# Patient Record
Sex: Male | Born: 1960 | Race: Black or African American | Hispanic: No | Marital: Married | Smoking: Never smoker
Health system: Southern US, Community
[De-identification: ages and names within clinical notes are randomized; demographics above are authoritative.]

## PROBLEM LIST (undated history)

## (undated) DIAGNOSIS — K279 Peptic ulcer, site unspecified, unspecified as acute or chronic, without hemorrhage or perforation: Secondary | ICD-10-CM

## (undated) DIAGNOSIS — I4891 Unspecified atrial fibrillation: Secondary | ICD-10-CM

## (undated) DIAGNOSIS — N183 Chronic kidney disease, stage 3 unspecified: Secondary | ICD-10-CM

## (undated) DIAGNOSIS — M109 Gout, unspecified: Secondary | ICD-10-CM

## (undated) DIAGNOSIS — I5022 Chronic systolic (congestive) heart failure: Secondary | ICD-10-CM

## (undated) DIAGNOSIS — I4819 Other persistent atrial fibrillation: Secondary | ICD-10-CM

## (undated) DIAGNOSIS — I10 Essential (primary) hypertension: Secondary | ICD-10-CM

## (undated) DIAGNOSIS — I428 Other cardiomyopathies: Secondary | ICD-10-CM

## (undated) HISTORY — DX: Chronic systolic (congestive) heart failure: I50.22

## (undated) HISTORY — DX: Unspecified atrial fibrillation: I48.91

## (undated) HISTORY — DX: Peptic ulcer, site unspecified, unspecified as acute or chronic, without hemorrhage or perforation: K27.9

## (undated) HISTORY — DX: Other cardiomyopathies: I42.8

## (undated) HISTORY — DX: Chronic kidney disease, stage 3 (moderate): N18.3

## (undated) HISTORY — DX: Other persistent atrial fibrillation: I48.19

## (undated) HISTORY — PX: TOE SURGERY: SHX1073

## (undated) HISTORY — DX: Chronic kidney disease, stage 3 unspecified: N18.30

---

## 2002-02-17 ENCOUNTER — Emergency Department (HOSPITAL_COMMUNITY): Admission: EM | Admit: 2002-02-17 | Discharge: 2002-02-18 | Payer: Self-pay | Admitting: Emergency Medicine

## 2003-02-17 ENCOUNTER — Emergency Department (HOSPITAL_COMMUNITY): Admission: EM | Admit: 2003-02-17 | Discharge: 2003-02-17 | Payer: Self-pay | Admitting: Emergency Medicine

## 2004-01-23 ENCOUNTER — Emergency Department (HOSPITAL_COMMUNITY): Admission: EM | Admit: 2004-01-23 | Discharge: 2004-01-23 | Payer: Self-pay | Admitting: Emergency Medicine

## 2004-10-21 ENCOUNTER — Emergency Department (HOSPITAL_COMMUNITY): Admission: EM | Admit: 2004-10-21 | Discharge: 2004-10-21 | Payer: Self-pay | Admitting: Emergency Medicine

## 2006-01-29 ENCOUNTER — Emergency Department (HOSPITAL_COMMUNITY): Admission: EM | Admit: 2006-01-29 | Discharge: 2006-01-29 | Payer: Self-pay | Admitting: Emergency Medicine

## 2008-02-24 ENCOUNTER — Emergency Department (HOSPITAL_COMMUNITY): Admission: EM | Admit: 2008-02-24 | Discharge: 2008-02-25 | Payer: Self-pay | Admitting: Emergency Medicine

## 2008-11-11 ENCOUNTER — Emergency Department (HOSPITAL_COMMUNITY): Admission: EM | Admit: 2008-11-11 | Discharge: 2008-11-11 | Payer: Self-pay | Admitting: Family Medicine

## 2009-06-25 ENCOUNTER — Encounter: Admission: RE | Admit: 2009-06-25 | Discharge: 2009-06-25 | Payer: Self-pay | Admitting: General Practice

## 2010-01-21 ENCOUNTER — Emergency Department (HOSPITAL_COMMUNITY): Admission: EM | Admit: 2010-01-21 | Discharge: 2010-01-21 | Payer: Self-pay | Admitting: Family Medicine

## 2012-02-18 ENCOUNTER — Emergency Department (INDEPENDENT_AMBULATORY_CARE_PROVIDER_SITE_OTHER)
Admission: EM | Admit: 2012-02-18 | Discharge: 2012-02-18 | Disposition: A | Discharge status: Home / Self Care | Payer: BC Managed Care – PPO | Source: Home / Self Care | Attending: Emergency Medicine | Admitting: Emergency Medicine

## 2012-02-18 ENCOUNTER — Encounter (HOSPITAL_COMMUNITY): Payer: Self-pay | Admitting: *Deleted

## 2012-02-18 DIAGNOSIS — R079 Chest pain, unspecified: Secondary | ICD-10-CM

## 2012-02-18 DIAGNOSIS — M109 Gout, unspecified: Secondary | ICD-10-CM

## 2012-02-18 HISTORY — DX: Gout, unspecified: M10.9

## 2012-02-18 MED ORDER — COLCHICINE 0.6 MG PO TABS: 0.6000 mg | ORAL_TABLET | Freq: Every day | ORAL | Status: DC

## 2012-02-18 MED ORDER — NITROGLYCERIN 0.4 MG SL SUBL: 0.4000 mg | SUBLINGUAL_TABLET | SUBLINGUAL | Status: DC | PRN

## 2012-02-18 NOTE — ED Notes (Signed)
Pt with c/o left great toe pain feels like a gout attack onset yesterday worse today -

## 2012-02-18 NOTE — Discharge Instructions (Signed)
Dietary treatment plays a supplementary role in treatment of gout.  Diet can be expected to decrease the uric acid level by 10 to 15%.  Often medication can effect a much more substantial reduction.  An extremely restrictive diet is not necessary, but here are a few suggestions that might help decrease the frequency of gout attacks. ° °The first and most important measure is to achieve and maintain a healthy body weight (BMI of 20 to 25).  It's been shown that people with a BMI over 25 have an increased risk of gout attacks compared to people with a BMI of less than 25.  Also, gout patients who lose as little as 4.5 kg or 9.9 lbs will decrease their risk of gout attacks. ° °Beyond that, here are a few general guidelines: ° °Eat less: °Red meat °Seafood °Beer and hard alcohol (e.g. gin, vodka, whiskey) °Foods that contain high fructose corn syrup (found in sweets and non-diet sodas) °Organ meats (liver, kidneys, brains, sweetbreads) or foods made from these meats (hot dogs, bologna). ° °Eat more: °Low fat dairy products °A moderate amount of wine (up to two 5 oz servings per day) is not likely to increase the risk of a gout attack. °Coffee may decrease the risk of gout attacks °Vitamin C (500 mg per day) has a mild urate lowering effect ° °

## 2012-02-18 NOTE — ED Provider Notes (Signed)
Chief Complaint  Patient presents with  . Gout    History of Present Illness:   Javier Berry is a 51 year old male who has had swelling, pain, redness, and heat in the MTP joint of the left great toe since yesterday. He denies any trauma he had the same thing about a year ago. This was diagnosed as being due to gout. He was seen here by me. He was given indomethacin and this relieves his symptoms immediately. He's not had another attack until yesterday. He is on no uric acid lowering medications and he has not followed up with his primary care doctor about this.  He also mentioned that he had an episode of chest pain today. He states he was trying to get his job done with his foot hurting and longus to this had an episode of substernal chest pain without radiation lasting less than a minute. It went away when he sat down and rested. It was not associated with shortness of breath, nausea, or diaphoresis. He's never had anything like this before. He denies any cardiac history. Right now is pain free. He denies any other cardiovascular complaints. He does have a high blood pressure. No other risk factors for cardiac disease.  Review of Systems:  Other than noted above, the patient denies any of the following symptoms: Systemic:  No fevers, chills, sweats, or aches.  No fatigue or tiredness. Musculoskeletal:  No joint pain, arthritis, bursitis, swelling, back pain, or neck pain. Neurological:  No muscular weakness, paresthesias, headache, or trouble with speech or coordination.  No dizziness.   PMFSH:  Past medical history, family history, social history, meds, and allergies were reviewed.  Physical Exam:   Vital signs:  BP 154/89  Pulse 82  Temp(Src) 98.5 F (36.9 C) (Oral)  Resp 18  SpO2 98% Gen:  Alert and oriented times 3.  In no distress. Lungs: Clear to auscultation. Heart: Regular rhythm no gallops or murmurs. Musculoskeletal: There was swelling and pain to palpation over the MTP joint of the  left great toe. Otherwise, all joints had a full a ROM with no swelling, bruising or deformity.  No edema, pulses full. Extremities were warm and pink.  Capillary refill was brisk.  Skin:  Clear, warm and dry.  No rash. Neuro:  Alert and oriented times 3.  Muscle strength was normal.  Sensation was intact to light touch.    Date: 02/18/2012  Rate: 75  Rhythm: normal sinus rhythm  QRS Axis: normal  Intervals: normal  ST/T Wave abnormalities: nonspecific T wave changes  Conduction Disutrbances:none  Narrative Interpretation: Sinus rhythm with premature supraventricular complexes. The computer reading interpreted nonspecific T wave abnormalities, although looking at myself, I can't really see any significant T wave changes in the EKG looks normal to me except for PACs.  Old EKG Reviewed: none available  Other Labs Obtained at Urgent [facility]:  A uric acid level was obtained.  Results are pending at this time and we will call about any positive results.  Assessment:  The primary encounter diagnosis was Gout. A diagnosis of Chest pain was also pertinent to this visit. He will take the colchicine for his gout and followup with his primary care physician in about 2 weeks. I also suggested that he followup with a cardiologist and he was given the name of Dr. Marca Ancona. I suggested he call tomorrow and try to get in as soon as possible. He was given a prescription for nitroglycerin if he has any further chest  pain. I suggested that if he had any chest pain that lasted for more than 15 minutes that he call 911 and get to the emergency room as soon as possible.  Plan:   1.  The following meds were prescribed:   New Prescriptions   COLCHICINE 0.6 MG TABLET    Take 1 tablet (0.6 mg total) by mouth daily.   NITROGLYCERIN (NITROSTAT) 0.4 MG SL TABLET    Place 1 tablet (0.4 mg total) under the tongue every 5 (five) minutes as needed for chest pain.   2.  The patient was instructed in symptomatic  care, including rest and activity, elevation, application of ice and compression.  Appropriate handouts were given. 3.  The patient was told to return if becoming worse in any way, if no better in 3 or 4 days, and given some red flag symptoms that would indicate earlier return.   4.  The patient was told to follow up with his primary care physician for the gout and with Dr. Marca Ancona for the episode of chest pain.   Javier Likes, MD 02/18/12 [phone]

## 2012-02-19 ENCOUNTER — Telehealth (HOSPITAL_COMMUNITY): Payer: Self-pay | Admitting: *Deleted

## 2012-02-19 NOTE — ED Notes (Signed)
Uric Acid 8.9 H. Pt. adequately treated with Colchicine.  I called pt. Pt. verified x 2 and given results.  Pt. told he was adequately treated. Pt. does have a PCP. Pt. instructed to notify his PCP of his result and ask about long term treatment to reduce his uric acid level. This will help prevent frequent outbreaks of gout. Pt. voiced understanding. He asked if the hot sauce on his wings caused it. I suggested he look on line at foods and drinks that cause gout. Vassie Moselle 02/19/2012

## 2013-07-20 ENCOUNTER — Other Ambulatory Visit: Payer: Self-pay

## 2013-07-20 ENCOUNTER — Emergency Department (INDEPENDENT_AMBULATORY_CARE_PROVIDER_SITE_OTHER)
Admission: EM | Admit: 2013-07-20 | Discharge: 2013-07-20 | Disposition: A | Discharge status: Home / Self Care | Payer: BC Managed Care – PPO | Source: Home / Self Care | Attending: Emergency Medicine | Admitting: Emergency Medicine

## 2013-07-20 ENCOUNTER — Emergency Department (INDEPENDENT_AMBULATORY_CARE_PROVIDER_SITE_OTHER): Payer: BC Managed Care – PPO

## 2013-07-20 ENCOUNTER — Encounter (HOSPITAL_COMMUNITY): Payer: Self-pay | Admitting: Emergency Medicine

## 2013-07-20 DIAGNOSIS — R0789 Other chest pain: Secondary | ICD-10-CM

## 2013-07-20 MED ORDER — METHOCARBAMOL 500 MG PO TABS: 500.0000 mg | ORAL_TABLET | Freq: Three times a day (TID) | ORAL | Status: DC

## 2013-07-20 MED ORDER — TRAMADOL HCL 50 MG PO TABS: 100.0000 mg | ORAL_TABLET | Freq: Three times a day (TID) | ORAL | Status: DC | PRN

## 2013-07-20 NOTE — ED Notes (Signed)
Pt c/o intermittent chest pain onset 3 weeks States after he finishes his gout meds the pain comes back and doesn't know if its related Pain is alleviated when he applies pressure to chest... Hx of HTN Denies: SOB, HA, blurry vision, weakness He is alert and talking in complete sentences w/no signs of acute distress.

## 2013-07-20 NOTE — ED Provider Notes (Signed)
Chief Complaint:   Chief Complaint  Patient presents with  . Chest Pain    History of Present Illness:   Javier Berry is a 52 year old male with hypertension and gout who has had a one-month history of recurring chest pain. The pain is localized along the left sternal border and the pectoral area and radiates to the posterior neck. It is rated 3 or 4/10 in intensity and feels like an ache or a pinching sensation. It can last for hours to days at a time. It came on after he lifted some heavy weights. The pain is not exertional. It's better with Tylenol or Vicodin. He denies any radiation down the arm, numbness, tingling, weakness in the arm. He has not had any fever, chills, or sweats. He denies coughing, wheezing, or shortness of breath. The pain is not worse with deep inspiration. He denies any palpitations, dizziness, syncope, ankle edema, or leg pain or swelling. He's had no dominant pain, nausea, vomiting, indigestion, or heartburn. He has no history of heart disease in the past. He does have a history of a stomach ulcer. He also has a history of gout and attributes the chest pain to gout.  Review of Systems:  Other than noted above, the patient denies any of the following symptoms. Systemic:  No fever, chills, sweats, or fatigue. ENT:  No nasal congestion, rhinorrhea, or sore throat. Pulmonary:  No cough, wheezing, shortness of breath, sputum production, hemoptysis. Cardiac:  No palpitations, rapid heartbeat, dizziness, presyncope or syncope. GI:  No abdominal pain, heartburn, nausea, or vomiting. Ext:  No leg pain or swelling.  PMFSH:  Past medical history, family history, social history, meds, and allergies were reviewed and updated as needed.  Physical Exam:   Vital signs:  BP 132/72  Pulse 66  Temp(Src) 98.1 F (36.7 C) (Oral)  Resp 20  SpO2 100% Gen:  Alert, oriented, in no distress, skin warm and dry. Eye:  PERRL, lids and conjunctivas normal.  Sclera non-icteric. ENT:  Mucous  membranes moist, pharynx clear. Neck:  Supple, no adenopathy or tenderness.  No JVD. Lungs:  Clear to auscultation, no wheezes, rales or rhonchi.  No respiratory distress. Heart:  Regular rhythm.  No gallops, murmers, clicks or rubs. Chest:  No chest wall tenderness. Abdomen:  Soft, nontender, no organomegaly or mass.  Bowel sounds normal.  No pulsatile abdominal mass or bruit. Ext:  No edema.  No calf tenderness and Homann's sign negative.  Pulses full and equal. Skin:  Warm and dry.  No rash.  Radiology:  Dg Chest 2 View  07/20/2013   CLINICAL DATA:  Chest pain for 3 weeks  EXAM: CHEST  2 VIEW  COMPARISON:  02/24/2008  FINDINGS: The cardiac shadow is within normal limits. The lungs are clear bilaterally. No acute bony abnormality is seen.  IMPRESSION: No acute abnormality noted.   Electronically Signed   By: Alcide Clever M.D.   On: 07/20/2013 12:31   I reviewed the images independently and personally and concur with the radiologist's findings.  EKG:   Date: 07/20/2013  Rate: 64  Rhythm: normal sinus rhythm  QRS Axis: normal  Intervals: normal  ST/T Wave abnormalities: nonspecific T wave changes  Conduction Disutrbances:none  Narrative Interpretation: Normal sinus rhythm, nonspecific T wave abnormalities, the T wave abnormalities are very minimal in nature, consisting only of some biphasic T.'s in lead aVL, otherwise remainder the leads appear normal to me. This is unchanged from multiple previous EKGs done in the past.  Old  EKG Reviewed: unchanged  Assessment:  The encounter diagnosis was Musculoskeletal chest pain.  This is most consistent with musculoskeletal chest pain, probably brought on by his weightlifting. I doubt is related to gout. There is no evidence of cardiac disease, but I told him, just to be on the safe side, that he should have a treadmill EKG and gave him the phone number and address of [facility].  Plan:   1.  Meds:  The following meds were prescribed:    New Prescriptions   METHOCARBAMOL (ROBAXIN) 500 MG TABLET    Take 1 tablet (500 mg total) by mouth 3 (three) times daily.   TRAMADOL (ULTRAM) 50 MG TABLET    Take 2 tablets (100 mg total) by mouth every 8 (eight) hours as needed for pain.    2.  Patient Education/Counseling:  The patient was given appropriate handouts, self care instructions, and instructed in symptomatic relief.  No weight lifting until this is cleared up completely.  3.  Follow up:  The patient was told to follow up if no better in 3 to 4 days, if becoming worse in any way, and give some red flag symptoms such as worsening pain, shortness of breath, diaphoresis, or nausea which would prompt immediate return.  Follow up with [facility] within the next week.  The patient was told that the differential diagnosis includes heart disease, and that it was of the utmost importance to followup with the specialist to whom he was referred.      Reuben Likes, MD 07/20/13 1255

## 2013-10-17 ENCOUNTER — Ambulatory Visit (INDEPENDENT_AMBULATORY_CARE_PROVIDER_SITE_OTHER): Payer: BC Managed Care – PPO | Admitting: Family Medicine

## 2013-10-17 ENCOUNTER — Ambulatory Visit: Payer: BC Managed Care – PPO

## 2013-10-17 VITALS — BP 170/100 | HR 82 | Temp 98.0°F | Resp 18 | Ht 71.5 in | Wt 221.4 lb

## 2013-10-17 DIAGNOSIS — I10 Essential (primary) hypertension: Secondary | ICD-10-CM

## 2013-10-17 DIAGNOSIS — R059 Cough, unspecified: Secondary | ICD-10-CM

## 2013-10-17 DIAGNOSIS — K219 Gastro-esophageal reflux disease without esophagitis: Secondary | ICD-10-CM

## 2013-10-17 DIAGNOSIS — M109 Gout, unspecified: Secondary | ICD-10-CM

## 2013-10-17 DIAGNOSIS — I4891 Unspecified atrial fibrillation: Secondary | ICD-10-CM

## 2013-10-17 DIAGNOSIS — I480 Paroxysmal atrial fibrillation: Secondary | ICD-10-CM | POA: Insufficient documentation

## 2013-10-17 DIAGNOSIS — R05 Cough: Secondary | ICD-10-CM

## 2013-10-17 DIAGNOSIS — I499 Cardiac arrhythmia, unspecified: Secondary | ICD-10-CM

## 2013-10-17 LAB — POCT CBC
Granulocyte percent: 45.6 %G (ref 37–80)
HEMATOCRIT: 50.9 % (ref 43.5–53.7)
HEMOGLOBIN: 16.4 g/dL (ref 14.1–18.1)
LYMPH, POC: 2.3 (ref 0.6–3.4)
MCH: 31.6 pg — AB (ref 27–31.2)
MCHC: 32.2 g/dL (ref 31.8–35.4)
MCV: 98 fL — AB (ref 80–97)
MID (cbc): 0.3 (ref 0–0.9)
MPV: 9.6 fL (ref 0–99.8)
POC GRANULOCYTE: 2.2 (ref 2–6.9)
POC LYMPH %: 47.3 % (ref 10–50)
POC MID %: 7.1 %M (ref 0–12)
Platelet Count, POC: 174 10*3/uL (ref 142–424)
RBC: 5.19 M/uL (ref 4.69–6.13)
RDW, POC: 14.1 %
WBC: 4.8 10*3/uL (ref 4.6–10.2)

## 2013-10-17 MED ORDER — DOXYCYCLINE HYCLATE 100 MG PO CAPS: 100.0000 mg | ORAL_CAPSULE | Freq: Two times a day (BID) | ORAL | Status: DC

## 2013-10-17 MED ORDER — METOPROLOL TARTRATE 25 MG PO TABS: 25.0000 mg | ORAL_TABLET | Freq: Two times a day (BID) | ORAL | Status: DC

## 2013-10-17 MED ORDER — RANITIDINE HCL 150 MG PO CAPS: ORAL_CAPSULE | ORAL | Status: DC

## 2013-10-17 NOTE — Progress Notes (Signed)
[facility] [address], [place] [address] [id number] [phone]  Date:  10/17/2013   Name:  Javier Berry   DOB:  Aug 09, 1961   MRN:  [id number]  PCP:  No PCP Per Patient    Chief Complaint: Wheezing, Cough and Chills   History of Present Illness:  Javier Berry is a 53 y.o. very pleasant male patient who presents with the following:  Here today with illness.  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 He was out in the cold in wet clothes recently while working and thinks this may be why he got sick. He has noted a cough and illness for about 9 days now.  At the start of his illness he had a fever and URI symptoms.  Now he has mostly cough, sometimes flushes and chills.    He is coughing up phlegm.  His nasal symptoms are starting to clear up.   He has occasional ST.   His symptoms are overall better but the cough continues.  He has not noted any stomach symptoms.   His PCP is with another clinic; he is quite sure of the name. He just started back on his BP medication today as he just got his new insurance card.  He is taking tribenzor but is not sure of the dose- was able to contact pharm and it is the 40/10/25  He was in the ED in October with CP; this was thought to be MSK in origin and he was in SR at that time. He was supposed to follow-up with [facility] cardiology but did not do so at that time as he did not have insurance.    Noted to be in a fib as below: he has never had this before.  He has maybe noted some palpitations but not syncope, dizziness, or chest pain.  He is able to work hard without CP, except when he goes out in the cold his lungs may hurt.  No known history of DM, CHF, or TIA/ CVA. He has never had a cardiology evaluation.   He states that he has "ulcers," however had never been formally diagnosed with same, no history of GI bleed.  He notes that after eating certain foods he may have abdominal pain and burning that may last for a  month or so.  He may take tums as needed for this problem.   There are no active problems to display for this patient.   Past Medical History  Diagnosis Date  . Gout   . Hypertension     History reviewed. No pertinent past surgical history.  History  Substance Use Topics  . Smoking status: Never Smoker   . Smokeless tobacco: Not on file  . Alcohol Use: No    Family History  Problem Relation Age of Onset  . Kidney disease Mother   . Diabetes Mother   . Hypertension Father   . Kidney disease Sister   . Stroke Sister   . Drug abuse Brother     No Known Allergies  Medication list has been reviewed and updated.  Current Outpatient Prescriptions on File Prior to Visit  Medication Sig Dispense Refill  . Olmesartan-Amlodipine-HCTZ (TRIBENZOR PO) Take by mouth.      . traMADol (ULTRAM) 50 MG tablet Take 2 tablets (100 mg total) by mouth every 8 (eight) hours as needed for pain.  30 tablet  0  . colchicine 0.6 MG tablet Take 1 tablet (0.6 mg  total) by mouth daily.  15 tablet  0  . methocarbamol (ROBAXIN) 500 MG tablet Take 1 tablet (500 mg total) by mouth 3 (three) times daily.  30 tablet  0  . nitroGLYCERIN (NITROSTAT) 0.4 MG SL tablet Place 1 tablet (0.4 mg total) under the tongue every 5 (five) minutes as needed for chest pain.  30 tablet  0   No current facility-administered medications on file prior to visit.    Review of Systems:  As per HPI- otherwise negative.   Physical Examination: Filed Vitals:   10/17/13 1529  BP: 170/100  Pulse: 82  Temp: 98 F (36.7 C)  Resp: 18   Filed Vitals:   10/17/13 1529  Height: 5' 11.5" (1.816 m)  Weight: 221 lb 6.4 oz (100.426 kg)   Body mass index is 30.45 kg/(m^2). Ideal Body Weight:   GEN: WDWN, NAD, Non-toxic, A & O x 3, looks well HEENT: Atraumatic, Normocephalic. Neck supple. No masses, No LAD.  Bilateral TM wnl, oropharynx normal.  PEERL,EOMI.   Ears and Nose: No external deformity. CV: No M/G/R. No JVD. No  thrill. No extra heart sounds. Irregularly irregular.  Suspect a fib.  He is ok with doing an EKG PULM: CTA B, no wheezes, crackles, rhonchi. No retractions. No resp. distress. No accessory muscle use. ABD: S, NT, ND, +BS. No rebound. No HSM. EXTR: No c/c/e NEURO Normal gait.  PSYCH: Normally interactive. Conversant. Not depressed or anxious appearing.  Calm demeanor.   EKG: atrial fibrillation with normal ventricular response, rate 80- 100 BPM  [facility] reading (PRIMARY) by  Dr. Patsy Lageropland. CXR: negative  CHEST 2 VIEW  COMPARISON: PA and lateral chest 07/20/2013 and 02/24/2008.  FINDINGS: The lungs are clear. Heart size is upper normal. No pneumothorax or pleural fluid. No focal bony abnormality.  IMPRESSION: No acute disease  Results for orders placed in visit on 10/17/13  POCT CBC      Result Value Range   WBC 4.8  4.6 - 10.2 K/uL   Lymph, poc 2.3  0.6 - 3.4   POC LYMPH PERCENT 47.3  10 - 50 %L   MID (cbc) 0.3  0 - 0.9   POC MID % 7.1  0 - 12 %M   POC Granulocyte 2.2  2 - 6.9   Granulocyte percent 45.6  37 - 80 %G   RBC 5.19  4.69 - 6.13 M/uL   Hemoglobin 16.4  14.1 - 18.1 g/dL   HCT, POC 40.350.9  47.443.5 - 53.7 %   MCV 98.0 (*) 80 - 97 fL   MCH, POC 31.6 (*) 27 - 31.2 pg   MCHC 32.2  31.8 - 35.4 g/dL   RDW, POC 25.914.1     Platelet Count, POC 174  142 - 424 K/uL   MPV 9.6  0 - 99.8 fL    Assessment and Plan: HTN (hypertension)  Gout  Irregular heartbeat - Plan: EKG 12-Lead  Cough - Plan: DG Chest 2 View, doxycycline (VIBRAMYCIN) 100 MG capsule  Atrial fibrillation - Plan: metoprolol tartrate (LOPRESSOR) 25 MG tablet, POCT CBC, Comprehensive metabolic panel  GERD (gastroesophageal reflux disease) - Plan: ranitidine (ZANTAC) 150 MG capsule  Javier Berry is noted to be in newly recognized a fib today.  He is asymptomatic.  His CHADS2 score is low, so we can use Asprin while we await cardiology evaluation.  He is concerned about his history of stomach sensitivity.  However he has  never had a known true ulcer or GI bleed.  Explained that aspirin is still overall safer than coumadin.  Will have him use a coated aspirin and add zantac to his regimen.  He is ok with this plan.   Also add lopressor 25 BID, continue his tribenzor.    HTN: he just started back on his medication today so difficult to know if his current dose will control his HTN.  Will need follow-up  Cough for several weeks: treat with doxycycline for bronchitis  Signed Abbe Amsterdam, MD

## 2013-10-17 NOTE — Patient Instructions (Addendum)
Start taking a coated aspirin 325 mg once a day. Also, add zantac once a day to help protect your stomach.  Avoid foods that tend to bother your stomach!   Continue taking your tribenzor medication for your blood pressure.  We are going to add lopressor (metoprolol) 25 mg twice a day to help control your heart rate.   Use the doxycycline as directed for bronchitis.  You have a condition called atrial fibrillation; this means that the signal coming from the top part of your heart that starts a new beat is going too fast.  The bottom part of your heart (the ventricles) are still going at a normal rate so you are getting enough blood flow.  However this condition does place you at a higher risk for having a blood clot in your heart that could lead to a stroke.  This is why we are going to have you take the aspirin every day. I will arrange for you to see cardiology in the next week or so for follow-up.

## 2013-10-18 ENCOUNTER — Encounter: Payer: Self-pay | Admitting: Cardiology

## 2013-10-18 ENCOUNTER — Telehealth: Payer: Self-pay | Admitting: Family Medicine

## 2013-10-18 DIAGNOSIS — I4891 Unspecified atrial fibrillation: Secondary | ICD-10-CM

## 2013-10-18 LAB — COMPREHENSIVE METABOLIC PANEL WITH GFR
ALT: 19 U/L (ref 0–53)
AST: 24 U/L (ref 0–37)
Albumin: 4.5 g/dL (ref 3.5–5.2)
Alkaline Phosphatase: 47 U/L (ref 39–117)
BUN: 15 mg/dL (ref 6–23)
CO2: 32 meq/L (ref 19–32)
Calcium: 9.7 mg/dL (ref 8.4–10.5)
Chloride: 103 meq/L (ref 96–112)
Creat: 1.37 mg/dL — ABNORMAL HIGH (ref 0.50–1.35)
Glucose, Bld: 95 mg/dL (ref 70–99)
Potassium: 3.9 meq/L (ref 3.5–5.3)
Sodium: 142 meq/L (ref 135–145)
Total Bilirubin: 1.1 mg/dL (ref 0.3–1.2)
Total Protein: 7.4 g/dL (ref 6.0–8.3)

## 2013-10-18 NOTE — Telephone Encounter (Signed)
Made cardiology appt for pt

## 2013-10-20 ENCOUNTER — Encounter: Payer: Self-pay | Admitting: Family Medicine

## 2013-10-26 ENCOUNTER — Encounter: Payer: Self-pay | Admitting: Cardiology

## 2013-10-28 ENCOUNTER — Encounter: Payer: Self-pay | Admitting: Cardiology

## 2013-10-28 ENCOUNTER — Ambulatory Visit (INDEPENDENT_AMBULATORY_CARE_PROVIDER_SITE_OTHER): Payer: BC Managed Care – PPO | Admitting: Cardiology

## 2013-10-28 ENCOUNTER — Encounter: Payer: Self-pay | Admitting: *Deleted

## 2013-10-28 VITALS — BP 122/84 | HR 83 | Ht 72.0 in | Wt 221.4 lb

## 2013-10-28 DIAGNOSIS — I10 Essential (primary) hypertension: Secondary | ICD-10-CM

## 2013-10-28 DIAGNOSIS — R079 Chest pain, unspecified: Secondary | ICD-10-CM

## 2013-10-28 DIAGNOSIS — I4891 Unspecified atrial fibrillation: Secondary | ICD-10-CM

## 2013-10-28 MED ORDER — APIXABAN 5 MG PO TABS: 5.0000 mg | ORAL_TABLET | Freq: Two times a day (BID) | ORAL | Status: DC

## 2013-10-28 NOTE — Progress Notes (Signed)
      HPI: 53 year-old male for evaluation of atrial fibrillation. Laboratories in January of 2015 showed a potassium of 3.9, BUN 15 and creatinine 1.37. Hemoglobin normal. Patient states that for the past 2 months he has had a "soreness" in his chest. It has been continuous without completely resolving. He had an episode of burning chest pain while having sex but otherwise has not had exertional chest pain. He denies dyspnea on exertion, orthopnea, PND, pedal edema, palpitations or syncope. Recently noted to be in atrial fibrillation on electrocardiogram and cardiology asked to evaluate.  Current Outpatient Prescriptions  Medication Sig Dispense Refill  . allopurinol (ZYLOPRIM) 100 MG tablet Take 100 mg by mouth as needed.      [name] aspirin 81 MG tablet Take 81 mg by mouth daily.      [name] doxycycline (VIBRAMYCIN) 100 MG capsule Take 1 capsule (100 mg total) by mouth 2 (two) times daily.  20 capsule  0  . HYDROcodone-acetaminophen (NORCO) 7.5-325 MG per tablet Take 1 tablet by mouth every 4 (four) hours as needed.       . metoprolol tartrate (LOPRESSOR) 25 MG tablet Take 1 tablet (25 mg total) by mouth 2 (two) times daily.  60 tablet  3  . Olmesartan-Amlodipine-HCTZ (TRIBENZOR) 40-10-25 MG TABS Take by mouth.      . Pseudoeph-Chlorphen-Hydrocod 60-4-5 MG/5ML SOLN as needed.       . ranitidine (ZANTAC) 150 MG capsule Take once or twice a day as needed for your stomach  60 capsule  3  . nitroGLYCERIN (NITROSTAT) 0.4 MG SL tablet Place 1 tablet (0.4 mg total) under the tongue every 5 (five) minutes as needed for chest pain.  30 tablet  0   No current facility-administered medications for this visit.     Past Medical History  Diagnosis Date  . Gout   . Hypertension   . PUD (peptic ulcer disease)   . Atrial fibrillation     Past Surgical History  Procedure Laterality Date  . Toe surgery      History   Social History  . Marital Status: Married    Spouse Name: N/A    Number of Children: 3   . Years of Education: N/A   Occupational History  .  [name]   Social History Main Topics  . Smoking status: Never Smoker   . Smokeless tobacco: Not on file  . Alcohol Use: Yes     Comment: Occasional  . Drug Use: No  . Sexual Activity: Not on file   Other Topics Concern  . Not on file   Social History Narrative  . No narrative on file    ROS: no fevers or chills, productive cough, hemoptysis, dysphasia, odynophagia, melena, hematochezia, dysuria, hematuria, rash, seizure activity, orthopnea, PND, pedal edema, claudication. Remaining systems are negative.  Physical Exam: Well-developed well-nourished in no acute distress.  Skin is warm and dry.  Patient does not appear to be depressed. No peripheral clubbing. Back is normal. HEENT is normal.  Neck is supple. No bruits. Normal carotid upstroke. Chest is clear to auscultation with normal expansion.  Cardiovascular exam is irregular, no murmurs rubs or gallops. Abdominal exam nontender or distended. No masses palpated. 2+ femoral pulses bilaterally with no bruits Extremities show no edema. neuro grossly intact  ECG 10/17/2013-atrial fibrillation with PVCs or aberrantly conducted beats. Nonspecific ST changes.   07/20/2012-sinus rhythm.    Atrial fibrillation no ST changes.

## 2013-10-28 NOTE — Assessment & Plan Note (Signed)
Patient has newly diagnosed atrial fibrillation. The duration is unclear but he was in sinus rhythm in October. He had a recent URI and I wonder if this contributed. Plan to check TSH and echocardiogram. Continue metoprolol for rate control. Embolic risk factor of hypertension. Discontinue aspirin and begin apixaban 5 mg by mouth twice a day. Check renal function and hemoglobin in 4 weeks. I would like to see if he will remain in sinus rhythm. Proceed with cardioversion 4 weeks after fully anticoagulated.

## 2013-10-28 NOTE — Assessment & Plan Note (Signed)
Continue present blood pressure medications. 

## 2013-10-28 NOTE — Patient Instructions (Signed)
Your physician recommends that you schedule a follow-up appointment in: 8-12 WEEKS WITH DR CRENSHAW  STOP ASPIRIN  START ELIQUIS 5 MG ONE TABLET TWICE DAILY  Your physician has requested that you have an echocardiogram. Echocardiography is a painless test that uses sound waves to create images of your heart. It provides your doctor with information about the size and shape of your heart and how well your heart's chambers and valves are working. This procedure takes approximately one hour. There are no restrictions for this procedure.   Your physician recommends that you HAVE LAB WORK TODAY  Your physician has requested that you have a stress echocardiogram. For further information please visit [email or web address] Please follow instruction sheet as given.   Your physician has recommended that you have a Cardioversion (DCCV). Electrical Cardioversion uses a jolt of electricity to your heart either through paddles or wired patches attached to your chest. This is a controlled, usually prescheduled, procedure. Defibrillation is done under light anesthesia in the hospital, and you usually go home the day of the procedure. This is done to get your heart back into a normal rhythm. You are not awake for the procedure. Please see the instruction sheet given to you today.   Your physician recommends that you return for lab work in: 4 WEEKS

## 2013-10-28 NOTE — Assessment & Plan Note (Signed)
Patient had burning chest pain with sex one time. Otherwise no exertional chest pain. Schedule stress echocardiogram for risk stratification.

## 2013-10-31 LAB — TSH: TSH: 1.55 u[IU]/mL (ref 0.35–5.50)

## 2013-11-02 ENCOUNTER — Encounter: Payer: Self-pay | Admitting: *Deleted

## 2013-11-02 ENCOUNTER — Telehealth: Payer: Self-pay | Admitting: *Deleted

## 2013-11-02 NOTE — Telephone Encounter (Signed)
Left message for pt to call, DCCV scheduled for 11-21-13 @ 1:30pm. Will need to go over instructions.  Instruction letter mailed to pt.

## 2013-11-03 ENCOUNTER — Other Ambulatory Visit: Payer: Self-pay | Admitting: Cardiology

## 2013-11-03 DIAGNOSIS — I4891 Unspecified atrial fibrillation: Secondary | ICD-10-CM

## 2013-11-16 ENCOUNTER — Ambulatory Visit (HOSPITAL_COMMUNITY): Payer: BC Managed Care – PPO | Attending: Cardiovascular Disease | Admitting: Radiology

## 2013-11-16 ENCOUNTER — Encounter: Payer: Self-pay | Admitting: Cardiovascular Disease

## 2013-11-16 DIAGNOSIS — I10 Essential (primary) hypertension: Secondary | ICD-10-CM | POA: Insufficient documentation

## 2013-11-16 DIAGNOSIS — I4891 Unspecified atrial fibrillation: Secondary | ICD-10-CM | POA: Insufficient documentation

## 2013-11-16 NOTE — Telephone Encounter (Signed)
Left message for pt to call if he did not receive letter or if he has questions.

## 2013-11-16 NOTE — Progress Notes (Signed)
Echocardiogram performed.  

## 2013-11-21 ENCOUNTER — Encounter (HOSPITAL_COMMUNITY)
Admission: RE | Disposition: A | Discharge status: Home / Self Care | Payer: Self-pay | Source: Ambulatory Visit | Attending: Cardiology

## 2013-11-21 ENCOUNTER — Ambulatory Visit (HOSPITAL_COMMUNITY)
Admission: RE | Admit: 2013-11-21 | Discharge: 2013-11-21 | Disposition: A | Discharge status: Home / Self Care | Payer: BC Managed Care – PPO | Source: Ambulatory Visit | Attending: Cardiology | Admitting: Cardiology

## 2013-11-21 ENCOUNTER — Encounter (HOSPITAL_COMMUNITY): Payer: Self-pay

## 2013-11-21 DIAGNOSIS — I4891 Unspecified atrial fibrillation: Secondary | ICD-10-CM | POA: Insufficient documentation

## 2013-11-21 DIAGNOSIS — I10 Essential (primary) hypertension: Secondary | ICD-10-CM | POA: Insufficient documentation

## 2013-11-21 DIAGNOSIS — Z538 Procedure and treatment not carried out for other reasons: Secondary | ICD-10-CM | POA: Insufficient documentation

## 2013-11-21 SURGERY — Surgical Case

## 2013-11-21 NOTE — Procedures (Signed)
Electrical Cardioversion Procedure Note [facility] [id number] 1961-04-17  Patient presented for DCCV; only taking apixaban once daily; patient instructed to take twice daily and plan DCCV in 4 weeks    [name] 11/21/2013, 12:50 PM

## 2013-11-21 NOTE — H&P (Addendum)
Javier KnappBilly Berry  10/28/2013 12:00 PM   Office Visit  MRN:  [id number]   Description: 53 year old male  Provider: Lewayne BuntingBrian S Crenshaw, MD  Department: [facility] [address] [facility]         Diagnoses    Atrial fibrillation    -  Primary    427.31    Chest pain        786.50    HTN (hypertension)        401.9      Reason for Visit    Atrial Fibrillation    New patient evaluation         Progress Notes    Lewayne BuntingBrian S Crenshaw, MD at 10/28/2013 11:44 AM    Status: Signed                     HPI: 53 year-old male for evaluation of atrial fibrillation. Laboratories in January of 2015 showed a potassium of 3.9, BUN 15 and creatinine 1.37. Hemoglobin normal. Patient states that for the past 2 months he has had a "soreness" in his chest. It has been continuous without completely resolving. He had an episode of burning chest pain while having sex but otherwise has not had exertional chest pain. He denies dyspnea on exertion, orthopnea, PND, pedal edema, palpitations or syncope. Recently noted to be in atrial fibrillation on electrocardiogram and cardiology asked to evaluate.    Current Outpatient Prescriptions   Medication  Sig  Dispense  Refill   .  allopurinol (ZYLOPRIM) 100 MG tablet  Take 100 mg by mouth as needed.         [name].  aspirin 81 MG tablet  Take 81 mg by mouth daily.         [name].  doxycycline (VIBRAMYCIN) 100 MG capsule  Take 1 capsule (100 mg total) by mouth 2 (two) times daily.   20 capsule   0   .  HYDROcodone-acetaminophen (NORCO) 7.5-325 MG per tablet  Take 1 tablet by mouth every 4 (four) hours as needed.          .  metoprolol tartrate (LOPRESSOR) 25 MG tablet  Take 1 tablet (25 mg total) by mouth 2 (two) times daily.   60 tablet   3   .  Olmesartan-Amlodipine-HCTZ (TRIBENZOR) 40-10-25 MG TABS  Take by mouth.         .  Pseudoeph-Chlorphen-Hydrocod 60-4-5 MG/5ML SOLN  as needed.          .  ranitidine (ZANTAC) 150 MG capsule  Take once or twice a day as needed for your stomach   60  capsule   3   .  nitroGLYCERIN (NITROSTAT) 0.4 MG SL tablet  Place 1 tablet (0.4 mg total) under the tongue every 5 (five) minutes as needed for chest pain.   30 tablet   0       No current facility-administered medications for this visit.           Past Medical History   Diagnosis  Date   .  Gout     .  Hypertension     .  PUD (peptic ulcer disease)     .  Atrial fibrillation           Past Surgical History   Procedure  Laterality  Date   .  Toe surgery             History       Social History   .  Marital Status:  Married       Spouse Name:  N/A       Number of Children:  3   .  Years of Education:  N/A       Occupational History   .    [name]       Social History Main Topics   .  Smoking status:  Never Smoker    .  Smokeless tobacco:  Not on file   .  Alcohol Use:  Yes         Comment: Occasional   .  Drug Use:  No   .  Sexual Activity:  Not on file       Other Topics  Concern   .  Not on file       Social History Narrative   .  No narrative on file        ROS: no fevers or chills, productive cough, hemoptysis, dysphasia, odynophagia, melena, hematochezia, dysuria, hematuria, rash, seizure activity, orthopnea, PND, pedal edema, claudication. Remaining systems are negative.   Physical Exam: Well-developed well-nourished in no acute distress.   Skin is warm and dry.   Patient does not appear to be depressed. No peripheral clubbing. Back is normal. HEENT is normal.   Neck is supple. No bruits. Normal carotid upstroke. Chest is clear to auscultation with normal expansion.   Cardiovascular exam is irregular, no murmurs rubs or gallops. Abdominal exam nontender or distended. No masses palpated. 2+ femoral pulses bilaterally with no bruits Extremities show no edema. neuro grossly intact   ECG 10/17/2013-atrial fibrillation with PVCs or aberrantly conducted beats. Nonspecific ST changes.    07/20/2012-sinus  rhythm.       Atrial fibrillation no ST changes.         Atrial fibrillation - Lewayne Bunting, MD at 10/28/2013 12:46 PM    Status: Written Related Problem: Atrial fibrillation    Patient has newly diagnosed atrial fibrillation. The duration is unclear but he was in sinus rhythm in October. He had a recent URI and I wonder if this contributed. Plan to check TSH and echocardiogram. Continue metoprolol for rate control. Embolic risk factor of hypertension. Discontinue aspirin and begin apixaban 5 mg by mouth twice a day. Check renal function and hemoglobin in 4 weeks. I would like to see if he will remain in sinus rhythm. Proceed with cardioversion 4 weeks after fully anticoagulated.         Chest pain - Lewayne Bunting, MD at 10/28/2013 12:46 PM    Status: Written Related Problem: Chest pain    Patient had burning chest pain with sex one time. Otherwise no exertional chest pain. Schedule stress echocardiogram for risk stratification.         HTN (hypertension) - Lewayne Bunting, MD at 10/28/2013 12:46 PM    Status: Written Related Problem: HTN (hypertension)    Continue present blood pressure medications.   For DCCV; patient has been taking apixaban only once daily; procedure cancelled; reschedule in 4 weeks after taking BID. Javier Berry

## 2013-11-25 ENCOUNTER — Other Ambulatory Visit (HOSPITAL_COMMUNITY): Payer: BC Managed Care – PPO

## 2013-11-25 ENCOUNTER — Other Ambulatory Visit: Payer: BC Managed Care – PPO

## 2013-11-30 ENCOUNTER — Telehealth: Payer: Self-pay | Admitting: *Deleted

## 2013-11-30 ENCOUNTER — Encounter: Payer: Self-pay | Admitting: *Deleted

## 2013-11-30 NOTE — Telephone Encounter (Signed)
Left message for pt to call, he was scheduled for DCCV 11-21-13. He was only taking his eliquis once daily. Pt instructed to increase eliquis to bid. DCCV has been rescheduled for 12-20-13 @ 12 noon. Letter of instructions mailed to pt.

## 2013-12-01 ENCOUNTER — Other Ambulatory Visit: Payer: Self-pay | Admitting: Cardiology

## 2013-12-01 DIAGNOSIS — I4891 Unspecified atrial fibrillation: Secondary | ICD-10-CM

## 2013-12-20 ENCOUNTER — Encounter (HOSPITAL_COMMUNITY): Payer: Self-pay | Admitting: Certified Registered Nurse Anesthetist

## 2013-12-20 ENCOUNTER — Ambulatory Visit (HOSPITAL_COMMUNITY): Payer: BC Managed Care – PPO | Admitting: Certified Registered Nurse Anesthetist

## 2013-12-20 ENCOUNTER — Encounter (HOSPITAL_COMMUNITY)
Admission: RE | Disposition: A | Discharge status: Home / Self Care | Payer: Self-pay | Source: Ambulatory Visit | Attending: Cardiology

## 2013-12-20 ENCOUNTER — Ambulatory Visit (HOSPITAL_COMMUNITY)
Admission: RE | Admit: 2013-12-20 | Discharge: 2013-12-20 | Disposition: A | Discharge status: Home / Self Care | Payer: BC Managed Care – PPO | Source: Ambulatory Visit | Attending: Cardiology | Admitting: Cardiology

## 2013-12-20 ENCOUNTER — Encounter (HOSPITAL_COMMUNITY): Payer: BC Managed Care – PPO | Admitting: Certified Registered Nurse Anesthetist

## 2013-12-20 DIAGNOSIS — I10 Essential (primary) hypertension: Secondary | ICD-10-CM | POA: Insufficient documentation

## 2013-12-20 DIAGNOSIS — I4891 Unspecified atrial fibrillation: Secondary | ICD-10-CM

## 2013-12-20 DIAGNOSIS — K219 Gastro-esophageal reflux disease without esophagitis: Secondary | ICD-10-CM | POA: Insufficient documentation

## 2013-12-20 DIAGNOSIS — R079 Chest pain, unspecified: Secondary | ICD-10-CM | POA: Insufficient documentation

## 2013-12-20 HISTORY — PX: CARDIOVERSION: SHX1299

## 2013-12-20 SURGERY — CARDIOVERSION
Anesthesia: General

## 2013-12-20 MED ORDER — LIDOCAINE HCL (CARDIAC) 20 MG/ML IV SOLN
INTRAVENOUS | Status: DC | PRN
  Administered 2013-12-20: 20 mg via INTRAVENOUS

## 2013-12-20 MED ORDER — SODIUM CHLORIDE 0.9 % IV SOLN
INTRAVENOUS | Status: DC | PRN
  Administered 2013-12-20: 12:00:00 via INTRAVENOUS

## 2013-12-20 MED ORDER — PROPOFOL 10 MG/ML IV BOLUS
INTRAVENOUS | Status: DC | PRN
  Administered 2013-12-20: 100 mg via INTRAVENOUS
  Administered 2013-12-20: 50 mg via INTRAVENOUS

## 2013-12-20 MED ORDER — SODIUM CHLORIDE 0.9 % IV SOLN
INTRAVENOUS | Status: DC
  Administered 2013-12-20: 11:00:00 via INTRAVENOUS

## 2013-12-20 NOTE — H&P (Signed)
Javier SimmersBilly F Berry  10/28/2013 12:00 PM   Office Visit  MRN:  [id number]   Description: 53 year old male  Provider: Lewayne BuntingBrian S Generoso Cropper, MD  Department: [facility] [address] [facility]         Diagnoses    Atrial fibrillation    -  Primary    427.31    Chest pain        786.50    HTN (hypertension)        401.9      Reason for Visit    Atrial Fibrillation    New patient evaluation         Progress Notes    Lewayne BuntingBrian S Karimah Winquist, MD at 10/28/2013 11:44 AM    Status: Signed                     HPI: 53 year-old male for evaluation of atrial fibrillation. Laboratories in January of 2015 showed a potassium of 3.9, BUN 15 and creatinine 1.37. Hemoglobin normal. Patient states that for the past 2 months he has had a "soreness" in his chest. It has been continuous without completely resolving. He had an episode of burning chest pain while having sex but otherwise has not had exertional chest pain. He denies dyspnea on exertion, orthopnea, PND, pedal edema, palpitations or syncope. Recently noted to be in atrial fibrillation on electrocardiogram and cardiology asked to evaluate.    Current Outpatient Prescriptions   Medication  Sig  Dispense  Refill   .  allopurinol (ZYLOPRIM) 100 MG tablet  Take 100 mg by mouth as needed.         [name].  aspirin 81 MG tablet  Take 81 mg by mouth daily.         [name].  doxycycline (VIBRAMYCIN) 100 MG capsule  Take 1 capsule (100 mg total) by mouth 2 (two) times daily.   20 capsule   0   .  HYDROcodone-acetaminophen (NORCO) 7.5-325 MG per tablet  Take 1 tablet by mouth every 4 (four) hours as needed.          .  metoprolol tartrate (LOPRESSOR) 25 MG tablet  Take 1 tablet (25 mg total) by mouth 2 (two) times daily.   60 tablet   3   .  Olmesartan-Amlodipine-HCTZ (TRIBENZOR) 40-10-25 MG TABS  Take by mouth.         .  Pseudoeph-Chlorphen-Hydrocod 60-4-5 MG/5ML SOLN  as needed.          .  ranitidine (ZANTAC) 150 MG capsule  Take once or twice a day as needed for your stomach    60 capsule   3   .  nitroGLYCERIN (NITROSTAT) 0.4 MG SL tablet  Place 1 tablet (0.4 mg total) under the tongue every 5 (five) minutes as needed for chest pain.   30 tablet   0       No current facility-administered medications for this visit.           Past Medical History   Diagnosis  Date   .  Gout     .  Hypertension     .  PUD (peptic ulcer disease)     .  Atrial fibrillation           Past Surgical History   Procedure  Laterality  Date   .  Toe surgery             History       Social History   .  Marital Status:  Married       Spouse Name:  N/A       Number of Children:  3   .  Years of Education:  N/A       Occupational History   .    [name]       Social History Main Topics   .  Smoking status:  Never Smoker    .  Smokeless tobacco:  Not on file   .  Alcohol Use:  Yes         Comment: Occasional   .  Drug Use:  No   .  Sexual Activity:  Not on file       Other Topics  Concern   .  Not on file       Social History Narrative   .  No narrative on file        ROS: no fevers or chills, productive cough, hemoptysis, dysphasia, odynophagia, melena, hematochezia, dysuria, hematuria, rash, seizure activity, orthopnea, PND, pedal edema, claudication. Remaining systems are negative.   Physical Exam: Well-developed well-nourished in no acute distress.   Skin is warm and dry.   Patient does not appear to be depressed. No peripheral clubbing. Back is normal. HEENT is normal.   Neck is supple. No bruits. Normal carotid upstroke. Chest is clear to auscultation with normal expansion.   Cardiovascular exam is irregular, no murmurs rubs or gallops. Abdominal exam nontender or distended. No masses palpated. 2+ femoral pulses bilaterally with no bruits Extremities show no edema. neuro grossly intact   ECG 10/17/2013-atrial fibrillation with PVCs or aberrantly conducted beats. Nonspecific ST changes.    07/20/2012-sinus  rhythm.       Atrial fibrillation no ST changes.         Atrial fibrillation - Lewayne Bunting, MD at 10/28/2013 12:46 PM    Status: Written Related Problem: Atrial fibrillation    Patient has newly diagnosed atrial fibrillation. The duration is unclear but he was in sinus rhythm in October. He had a recent URI and I wonder if this contributed. Plan to check TSH and echocardiogram. Continue metoprolol for rate control. Embolic risk factor of hypertension. Discontinue aspirin and begin apixaban 5 mg by mouth twice a day. Check renal function and hemoglobin in 4 weeks. I would like to see if he will remain in sinus rhythm. Proceed with cardioversion 4 weeks after fully anticoagulated.         Chest pain - Lewayne Bunting, MD at 10/28/2013 12:46 PM    Status: Written Related Problem: Chest pain    Patient had burning chest pain with sex one time. Otherwise no exertional chest pain. Schedule stress echocardiogram for risk stratification.         HTN (hypertension) - Lewayne Bunting, MD at 10/28/2013 12:46 PM    Status: Written Related Problem: HTN (hypertension)    Continue present blood pressure medications.    For DCCV; no changes. [name]

## 2013-12-20 NOTE — Discharge Instructions (Signed)
Electrical Cardioversion °Electrical cardioversion is the delivery of a jolt of electricity to change the rhythm of the heart. Sticky patches or metal paddles are placed on the chest to deliver the electricity from a device. This is done to restore a normal rhythm. A rhythm that is too fast or not regular keeps the heart from pumping well. °Electrical cardioversion is done in an emergency if:  °· There is low or no blood pressure as a result of the heart rhythm.   °· Normal rhythm must be restored as fast as possible to protect the brain and heart from further damage.   °· It may save a life. °Cardioversion may be done for heart rhythms that are not immediately life-threatening, such as atrial fibrillation or flutter, in which:  °· The heart is beating too fast or is not regular.   °· Medicine to change the rhythm has not worked.   °· It is safe to wait in order to allow time for preparation. °· Symptoms of the abnormal rhythm are bothersome. °· The risk of stroke and other serious complications can be reduced. °LET YOUR CAREGIVER KNOW ABOUT:  °· All medicines you are taking, including vitamins, herbs, eye drops, creams, and over-the-counter medicines.   °· Previous problems you or members of your family have had with the use of anesthetics.   °· Any blood disorders you have.   °· Previous surgeries you have had.   °· Medical conditions you have. °RISKS AND COMPLICATIONS  °Generally, this is a safe procedure. However, as with any procedure, complications can occur. Possible complications include:  °· Breathing problems related to the anesthetic used. °· Cardiac arrest This risk is rare. °· A blood clot that breaks free and travels to other parts of your body. This could cause a stroke or other problems. The risk of this is lowered by use of blood thinning medicine (anticoagulant) prior to the procedure. °BEFORE THE PROCEDURE  °· You may have tests to detect blood clots in your heart and evaluate heart  function.  °· You may start taking anticoagulants so your blood does not clot as easily.   °· Medicines may be given to help stabilize your heart rate and rhythm. °PROCEDURE °· You will be given medicine through an IV tube to reduce discomfort and make you sleepy (sedative).   °· An electrical shock will be delivered. °AFTER THE PROCEDURE °Your heart rhythm will be watched to make sure it does not change. You may be able to go home within a few hours.  °Document Released: 09/05/2002 Document Revised: 07/06/2013 Document Reviewed: 03/30/2013 °Exit[employer]® Patient Information ©2014 [employer]. ° °

## 2013-12-20 NOTE — Transfer of Care (Signed)
Immediate Anesthesia Transfer of Care Note  Patient: Javier Berry  Procedure(s) Performed: Procedure(s): CARDIOVERSION (N/A)  Patient Location: Endoscopy Unit  Anesthesia Type:General  Level of Consciousness: awake, alert  and oriented  Airway & Oxygen Therapy: Patient Spontanous Breathing and Patient connected to nasal cannula oxygen  Post-op Assessment: Report given to PACU RN, Post -op Vital signs reviewed and stable and Patient moving all extremities X 4  Post vital signs: Reviewed and stable  Complications: No apparent anesthesia complications

## 2013-12-20 NOTE — Anesthesia Preprocedure Evaluation (Addendum)
Anesthesia Evaluation  Patient identified by MRN, date of birth, ID band Patient awake    Reviewed: Allergy & Precautions, H&P , NPO status , Patient's Chart, lab work & pertinent test results  History of Anesthesia Complications Negative for: history of anesthetic complications  Airway Mallampati: II TM Distance: >3 FB Neck ROM: Full    Dental  (+) Dental Advisory Given, Teeth Intact   Pulmonary neg pulmonary ROS,  breath sounds clear to auscultation        Cardiovascular hypertension, Pt. on medications and Pt. on home beta blockers + dysrhythmias Atrial Fibrillation Rhythm:Irregular Rate:Normal  11/16/13 Echo:  Study Conclusions  - Left ventricle: The cavity size was normal. Wall thickness was increased in a pattern of moderate LVH. Systolic function was normal. The estimated ejection fraction was in the range of 60% to 65%. Wall motion was normal; there were no regional wall motion abnormalities. - Left atrium: The atrium was mildly to moderately dilated. - Right atrium: The atrium was mildly to moderately dilated.   Neuro/Psych negative neurological ROS     GI/Hepatic Neg liver ROS, PUD, GERD-  Medicated and Controlled,  Endo/Other  Morbid obesity  Renal/GU negative Renal ROS     Musculoskeletal   Abdominal (+) + obese,   Peds  Hematology   Anesthesia Other Findings   Reproductive/Obstetrics                       Anesthesia Physical Anesthesia Plan  ASA: III  Anesthesia Plan: General   Post-op Pain Management:    Induction: Intravenous  Airway Management Planned: Natural Airway and Mask  Additional Equipment:   Intra-op Plan:   Post-operative Plan:   Informed Consent: I have reviewed the patients History and Physical, chart, labs and discussed the procedure including the risks, benefits and alternatives for the proposed anesthesia with the patient or authorized representative  who has indicated his/her understanding and acceptance.   Dental advisory given  Plan Discussed with: CRNA, Anesthesiologist and Surgeon  Anesthesia Plan Comments: (Plan routine monitors, GA for cardioversion)       Anesthesia Quick Evaluation

## 2013-12-20 NOTE — Procedures (Signed)
Electrical Cardioversion Procedure Note Javier Berry [id number] 1961/03/01  Procedure: Electrical Cardioversion Indications:  Atrial Fibrillation  Procedure Details Consent: Risks of procedure as well as the alternatives and risks of each were explained to the (patient/caregiver).  Consent for procedure obtained. Time Out: Verified patient identification, verified procedure, site/side was marked, verified correct patient position, special equipment/implants available, medications/allergies/relevent history reviewed, required imaging and test results available.  Performed  Patient placed on cardiac monitor, pulse oximetry, supplemental oxygen as necessary.  Sedation given: Patient sedated by anesthesia with diprovan 150 mg IV. Pacer pads placed anterior and posterior chest.  Cardioverted 1 time(s).  Cardioverted at 120J.  Evaluation Findings: Post procedure EKG shows: NSR with PACs Complications: None Patient did tolerate procedure well.   Javier Berry 12/20/2013, 12:07 PM

## 2013-12-20 NOTE — Preoperative (Signed)
Beta Blockers   Reason not to administer Beta Blockers:Not Applicable 

## 2013-12-20 NOTE — Anesthesia Postprocedure Evaluation (Signed)
  Anesthesia Post-op Note  Patient: Javier Berry  Procedure(s) Performed: Procedure(s): CARDIOVERSION (N/A)  Patient Location: Endoscopy Unit  Anesthesia Type:General  Level of Consciousness: awake, alert  and oriented  Airway and Oxygen Therapy: Patient Spontanous Breathing and Patient connected to nasal cannula oxygen  Post-op Pain: none  Post-op Assessment: Post-op Vital signs reviewed, Patient's Cardiovascular Status Stable, Respiratory Function Stable, Patent Airway and No signs of Nausea or vomiting  Post-op Vital Signs: Reviewed and stable  Complications: No apparent anesthesia complications

## 2013-12-21 ENCOUNTER — Encounter (HOSPITAL_COMMUNITY): Payer: Self-pay | Admitting: Cardiology

## 2013-12-23 ENCOUNTER — Ambulatory Visit: Payer: BC Managed Care – PPO | Admitting: Cardiology

## 2014-01-16 ENCOUNTER — Ambulatory Visit (INDEPENDENT_AMBULATORY_CARE_PROVIDER_SITE_OTHER): Payer: BC Managed Care – PPO | Admitting: Cardiology

## 2014-01-16 ENCOUNTER — Encounter: Payer: Self-pay | Admitting: Cardiology

## 2014-01-16 VITALS — BP 118/82 | HR 109 | Ht 71.0 in | Wt 225.8 lb

## 2014-01-16 DIAGNOSIS — I4891 Unspecified atrial fibrillation: Secondary | ICD-10-CM

## 2014-01-16 MED ORDER — METOPROLOL TARTRATE 50 MG PO TABS: 50.0000 mg | ORAL_TABLET | Freq: Two times a day (BID) | ORAL | Status: DC

## 2014-01-16 MED ORDER — LOSARTAN POTASSIUM-HCTZ 100-12.5 MG PO TABS: 1.0000 | ORAL_TABLET | Freq: Every day | ORAL | Status: DC

## 2014-01-16 NOTE — Assessment & Plan Note (Signed)
No recent chest pain. Plan functional study later once atrial fibrillation plan complete.

## 2014-01-16 NOTE — Assessment & Plan Note (Signed)
Patient has developed recurrent atrial fibrillation status post cardioversion. I reviewed the options in depth today including rhythm control versus rate control. He is not significantly symptomatic. He would like to try rate control for a short amount of time. If he develops symptoms we will add flecainide and repeat cardioversion. His rate is elevated today. Increase metoprolol to 50 mg by mouth twice a day. Continue apixaban. Check hemoglobin and renal function.

## 2014-01-16 NOTE — Progress Notes (Signed)
      HPI: FU atrial fibrillation. Laboratories in January of 2015 showed a potassium of 3.9, BUN 15 and creatinine 1.37. Hemoglobin normal. Echocardiogram in February 2015 showed normal LV function and mild to moderate biatrial enlargement. TSH January 2015 normal. The patient underwent cardioversion on 12/20/2013. Since that time, He denies dyspnea, chest pain, palpitations or syncope.   Current Outpatient Prescriptions  Medication Sig Dispense Refill  . allopurinol (ZYLOPRIM) 100 MG tablet Take 100 mg by mouth as needed.      [name] apixaban (ELIQUIS) 5 MG TABS tablet Take 1 tablet (5 mg total) by mouth 2 (two) times daily.  60 tablet  6  . HYDROcodone-acetaminophen (NORCO) 7.5-325 MG per tablet Take 1 tablet by mouth every 4 (four) hours as needed.       . Olmesartan-Amlodipine-HCTZ (TRIBENZOR) 40-10-25 MG TABS Take 1 tablet by mouth daily.       . ranitidine (ZANTAC) 150 MG capsule Take once or twice a day as needed for your stomach  60 capsule  3  . metoprolol tartrate (LOPRESSOR) 25 MG tablet Take 1 tablet (25 mg total) by mouth 2 (two) times daily.  60 tablet  3   No current facility-administered medications for this visit.     Past Medical History  Diagnosis Date  . Gout   . Hypertension   . PUD (peptic ulcer disease)   . Atrial fibrillation     Past Surgical History  Procedure Laterality Date  . Toe surgery    . Cardioversion N/A 12/20/2013    Procedure: CARDIOVERSION;  Surgeon: Lewayne Bunting, MD;  Location: [facility] ENDOSCOPY;  Service: Cardiovascular;  Laterality: N/A;    History   Social History  . Marital Status: Married    Spouse Name: N/A    Number of Children: 3  . Years of Education: N/A   Occupational History  .  [name]   Social History Main Topics  . Smoking status: Never Smoker   . Smokeless tobacco: Not on file  . Alcohol Use: Yes     Comment: Occasional  . Drug Use: No  . Sexual Activity: Not on file   Other Topics Concern  . Not  on file   Social History Narrative  . No narrative on file    ROS: Some numbness in right lower extremity but no fevers or chills, productive cough, hemoptysis, dysphasia, odynophagia, melena, hematochezia, dysuria, hematuria, rash, seizure activity, orthopnea, PND, pedal edema, claudication. Remaining systems are negative.  Physical Exam: Well-developed well-nourished in no acute distress.  Skin is warm and dry.  HEENT is normal.  Neck is supple.  Chest is clear to auscultation with normal expansion.  Cardiovascular exam is irregular and tachycardic Abdominal exam nontender or distended. No masses palpated. 2+ femoral pulses Extremities show no edema. 2+ posterior tibial pulses neuro grossly intact  ECG Atrial fibrillation at a rate of 109. Nonspecific ST changes.

## 2014-01-16 NOTE — Patient Instructions (Signed)
Your physician wants you to follow-up in: 3 MONTHS WITH DR Jens Som You will receive a reminder letter in the mail two months in advance. If you don't receive a letter, please call our office to schedule the follow-up appointment.    STOP TRIBENZOR  START LOSARTAN/HCTZ 100-12.5 MG ONCE DAILY  INCREASE METOPROLOL TO 50 MG TWICE DAILY  Your physician recommends that you return for lab work in: ONE WEEK

## 2014-01-16 NOTE — Assessment & Plan Note (Signed)
Patient's blood pressure is borderline. Since we are increasing his metoprolol I will discontinue his tribenzor and instead treat with Hyzaar 100/12.5 daily.

## 2014-01-23 ENCOUNTER — Other Ambulatory Visit (INDEPENDENT_AMBULATORY_CARE_PROVIDER_SITE_OTHER): Payer: BC Managed Care – PPO

## 2014-01-23 DIAGNOSIS — I4891 Unspecified atrial fibrillation: Secondary | ICD-10-CM

## 2014-01-24 ENCOUNTER — Other Ambulatory Visit: Payer: Self-pay | Admitting: *Deleted

## 2014-01-24 DIAGNOSIS — I4891 Unspecified atrial fibrillation: Secondary | ICD-10-CM

## 2014-01-24 DIAGNOSIS — I10 Essential (primary) hypertension: Secondary | ICD-10-CM

## 2014-01-24 LAB — BASIC METABOLIC PANEL
BUN: 21 mg/dL (ref 6–23)
CHLORIDE: 106 meq/L (ref 96–112)
CO2: 27 meq/L (ref 19–32)
Calcium: 9.5 mg/dL (ref 8.4–10.5)
Creatinine, Ser: 1.5 mg/dL (ref 0.4–1.5)
GFR: 65.13 mL/min (ref >60.00)
GLUCOSE: 77 mg/dL (ref 70–99)
Potassium: 3.6 mEq/L (ref 3.5–5.1)
SODIUM: 140 meq/L (ref 135–145)

## 2014-01-24 LAB — CBC WITH DIFFERENTIAL/PLATELET
BASOS ABS: 0 10*3/uL (ref 0.0–0.1)
Basophils Relative: 0.3 % (ref 0.0–3.0)
Eosinophils Absolute: 1.1 10*3/uL — ABNORMAL HIGH (ref 0.0–0.7)
Eosinophils Relative: 16.7 % — ABNORMAL HIGH (ref 0.0–5.0)
HCT: 42.7 % (ref 39.0–52.0)
HEMOGLOBIN: 14.5 g/dL (ref 13.0–17.0)
LYMPHS ABS: 1.6 10*3/uL (ref 0.7–4.0)
Lymphocytes Relative: 25.8 % (ref 12.0–46.0)
MCHC: 33.9 g/dL (ref 30.0–36.0)
MCV: 94 fl (ref 78.0–100.0)
MONOS PCT: 5.1 % (ref 3.0–12.0)
Monocytes Absolute: 0.3 10*3/uL (ref 0.1–1.0)
NEUTROS ABS: 3.3 10*3/uL (ref 1.4–7.7)
Neutrophils Relative %: 52.1 % (ref 43.0–77.0)
Platelets: 183 10*3/uL (ref 150.0–400.0)
RBC: 4.54 Mil/uL (ref 4.22–5.81)
RDW: 14.2 % (ref 11.5–14.6)
WBC: 6.3 10*3/uL (ref 4.5–10.5)

## 2014-02-21 ENCOUNTER — Other Ambulatory Visit: Payer: BC Managed Care – PPO

## 2014-02-22 ENCOUNTER — Other Ambulatory Visit (INDEPENDENT_AMBULATORY_CARE_PROVIDER_SITE_OTHER): Payer: BC Managed Care – PPO

## 2014-02-22 DIAGNOSIS — I4891 Unspecified atrial fibrillation: Secondary | ICD-10-CM

## 2014-02-22 DIAGNOSIS — I10 Essential (primary) hypertension: Secondary | ICD-10-CM

## 2014-02-22 DIAGNOSIS — R079 Chest pain, unspecified: Secondary | ICD-10-CM

## 2014-02-23 LAB — CBC WITH DIFFERENTIAL/PLATELET
BASOS ABS: 0 10*3/uL (ref 0.0–0.1)
Basophils Relative: 0.5 % (ref 0.0–3.0)
Eosinophils Absolute: 0.3 10*3/uL (ref 0.0–0.7)
Eosinophils Relative: 6.3 % — ABNORMAL HIGH (ref 0.0–5.0)
HCT: 41.5 % (ref 39.0–52.0)
Hemoglobin: 14.2 g/dL (ref 13.0–17.0)
Lymphocytes Relative: 38 % (ref 12.0–46.0)
Lymphs Abs: 2 10*3/uL (ref 0.7–4.0)
MCHC: 34.2 g/dL (ref 30.0–36.0)
MCV: 94.8 fl (ref 78.0–100.0)
MONO ABS: 0.3 10*3/uL (ref 0.1–1.0)
MONOS PCT: 5.7 % (ref 3.0–12.0)
Neutro Abs: 2.6 10*3/uL (ref 1.4–7.7)
Neutrophils Relative %: 49.5 % (ref 43.0–77.0)
PLATELETS: 184 10*3/uL (ref 150.0–400.0)
RBC: 4.38 Mil/uL (ref 4.22–5.81)
RDW: 14.7 % (ref 11.5–15.5)
WBC: 5.2 10*3/uL (ref 4.0–10.5)

## 2014-02-23 LAB — BASIC METABOLIC PANEL
BUN: 15 mg/dL (ref 6–23)
CALCIUM: 9.5 mg/dL (ref 8.4–10.5)
CO2: 29 mEq/L (ref 19–32)
Chloride: 110 mEq/L (ref 96–112)
Creatinine, Ser: 1.5 mg/dL (ref 0.4–1.5)
GFR: 64.09 mL/min (ref >60.00)
Glucose, Bld: 83 mg/dL (ref 70–99)
Potassium: 3.6 mEq/L (ref 3.5–5.1)
SODIUM: 143 meq/L (ref 135–145)

## 2014-10-12 ENCOUNTER — Encounter (HOSPITAL_COMMUNITY): Payer: Self-pay | Admitting: Cardiology

## 2014-11-20 ENCOUNTER — Other Ambulatory Visit: Payer: Self-pay | Admitting: Cardiology

## 2014-11-21 ENCOUNTER — Other Ambulatory Visit: Payer: Self-pay | Admitting: Cardiology

## 2014-11-21 MED ORDER — ELIQUIS 5 MG PO TABS: ORAL_TABLET | ORAL | Status: DC

## 2014-12-08 ENCOUNTER — Emergency Department (HOSPITAL_COMMUNITY)
Admission: EM | Admit: 2014-12-08 | Discharge: 2014-12-08 | Disposition: A | Discharge status: Home / Self Care | Payer: BC Managed Care – PPO | Source: Home / Self Care | Attending: Emergency Medicine | Admitting: Emergency Medicine

## 2014-12-08 ENCOUNTER — Encounter (HOSPITAL_COMMUNITY): Payer: Self-pay | Admitting: Emergency Medicine

## 2014-12-08 DIAGNOSIS — K047 Periapical abscess without sinus: Secondary | ICD-10-CM

## 2014-12-08 MED ORDER — PENICILLIN V POTASSIUM 500 MG PO TABS: 500.0000 mg | ORAL_TABLET | Freq: Four times a day (QID) | ORAL | Status: AC

## 2014-12-08 MED ORDER — IBUPROFEN 600 MG PO TABS: 600.0000 mg | ORAL_TABLET | Freq: Three times a day (TID) | ORAL | Status: DC

## 2014-12-08 MED ORDER — HYDROCODONE-ACETAMINOPHEN 7.5-325 MG PO TABS: 1.0000 | ORAL_TABLET | ORAL | Status: DC | PRN

## 2014-12-08 NOTE — Discharge Instructions (Signed)
Your tooth is likely infected. Take penicillin for 10 days. Take ibuprofen 600 mg 3 times a day to help control the pain. Use the Norco as needed for severe pain. Please follow-up with a dentist as soon as possible.   Abscessed Tooth An abscessed tooth is an infection around your tooth. It may be caused by holes or damage to the tooth (cavity) or a dental disease. An abscessed tooth causes mild to very bad pain in and around the tooth. See your dentist right away if you have tooth or gum pain. HOME CARE  Take your medicine as told. Finish it even if you start to feel better.  Do not drive after taking pain medicine.  Rinse your mouth (gargle) often with salt water ( teaspoon salt in 8 ounces of warm water).  Do not apply heat to the outside of your face. GET HELP RIGHT AWAY IF:   You have a temperature by mouth above 102 F (38.9 C), not controlled by medicine.  You have chills and a very bad headache.  You have problems breathing or swallowing.  Your mouth will not open.  You develop puffiness (swelling) on the neck or around the eye.  Your pain is not helped by medicine.  Your pain is getting worse instead of better. MAKE SURE YOU:   Understand these instructions.  Will watch your condition.  Will get help right away if you are not doing well or get worse. Document Released: 03/03/2008 Document Revised: 12/08/2011 Document Reviewed: 12/24/2010 [facility] Patient Information 2015 [place], [place]. This information is not intended to replace advice given to you by your health care provider. Make sure you discuss any questions you have with your health care provider.

## 2014-12-08 NOTE — ED Provider Notes (Signed)
CSN: [id number]     Arrival date & time 12/08/14  1728 History   First MD Initiated Contact with Patient 12/08/14 1827     Chief Complaint  Patient presents with  . Dental Pain   (Consider location/radiation/quality/duration/timing/severity/associated sxs/prior Treatment) HPI  He is a 54 year old man here for evaluation of dental pain. He states this started about 5 days ago and has gradually been getting worse. It is his left lower molar. The pain is worse particularly at night. No fevers or chills. He does report some mild swelling of his jaw. He tried to see a dentist today, but was unable to be seen.  Past Medical History  Diagnosis Date  . Gout   . Hypertension   . PUD (peptic ulcer disease)   . Atrial fibrillation    Past Surgical History  Procedure Laterality Date  . Toe surgery    . Cardioversion N/A 12/20/2013    Procedure: CARDIOVERSION;  Surgeon: Lewayne Bunting, MD;  Location: [facility] ENDOSCOPY;  Service: Cardiovascular;  Laterality: N/A;   Family History  Problem Relation Age of Onset  . Kidney disease Mother   . Diabetes Mother   . Hypertension Father   . Kidney disease Sister   . Stroke Sister   . Drug abuse Brother   . Heart disease Brother     Murmur  . Heart disease Father     CHF   History  Substance Use Topics  . Smoking status: Never Smoker   . Smokeless tobacco: Not on file  . Alcohol Use: Yes     Comment: Occasional    Review of Systems  Constitutional: Negative for fever.  HENT: Positive for dental problem.     Allergies  Review of patient's allergies indicates no known allergies.  Home Medications   Prior to Admission medications   Medication Sig Start Date End Date Taking? Authorizing Provider  ELIQUIS 5 MG TABS tablet Take 1 tablet by mouth twice daily, will need OV for further refills 11/21/14  Yes Lewayne Bunting, MD  losartan-hydrochlorothiazide (HYZAAR) 100-12.5 MG per tablet Take 1 tablet by mouth daily. 01/16/14  Yes Lewayne Bunting, MD  allopurinol (ZYLOPRIM) 100 MG tablet Take 100 mg by mouth as needed.    Historical Provider, MD  HYDROcodone-acetaminophen (NORCO) 7.5-325 MG per tablet Take 1 tablet by mouth every 4 (four) hours as needed. 12/08/14   Charm Rings, MD  ibuprofen (ADVIL,MOTRIN) 600 MG tablet Take 1 tablet (600 mg total) by mouth 3 (three) times daily. 12/08/14   Charm Rings, MD  metoprolol tartrate (LOPRESSOR) 50 MG tablet Take 1 tablet (50 mg total) by mouth 2 (two) times daily. 01/16/14   Lewayne Bunting, MD  penicillin v potassium (VEETID) 500 MG tablet Take 1 tablet (500 mg total) by mouth 4 (four) times daily. 12/08/14 12/15/14  Charm Rings, MD  ranitidine (ZANTAC) 150 MG capsule Take once or twice a day as needed for your stomach 10/17/13   Gwenlyn Found Copland, MD   BP 154/107 mmHg  Pulse 103  Temp(Src) 97.1 F (36.2 C) (Oral)  Resp 14  SpO2 97% Physical Exam  Constitutional: He is oriented to person, place, and time. He appears well-developed and well-nourished. No distress.  HENT:  Mouth/Throat:    Cardiovascular: Tachycardia present.   Pulmonary/Chest: Effort normal.  Neurological: He is alert and oriented to person, place, and time.    ED Course  Procedures (including critical care time) Labs Review Labs Reviewed -  No data to display  Imaging Review No results found.   MDM   1. Dental abscess    We'll treat with penicillin, ibuprofen, Norco. He is pulse is slightly elevated, this likely secondary to pain. He does have A. fib. He is on Eliquis. He will follow-up with a dentist as soon as possible.    Charm Rings, MD 12/08/14 [phone]

## 2014-12-08 NOTE — ED Notes (Signed)
C/o dental pain onset 5 days Denies fevers, chills Alert, no signs of acute distress.

## 2015-01-22 ENCOUNTER — Other Ambulatory Visit: Payer: Self-pay | Admitting: Cardiology

## 2015-02-03 ENCOUNTER — Observation Stay (HOSPITAL_COMMUNITY)
Admission: EM | Admit: 2015-02-03 | Discharge: 2015-02-06 | Disposition: A | Discharge status: Home / Self Care | Payer: BC Managed Care – PPO | Attending: Internal Medicine | Admitting: Internal Medicine

## 2015-02-03 ENCOUNTER — Emergency Department (INDEPENDENT_AMBULATORY_CARE_PROVIDER_SITE_OTHER)
Admission: EM | Admit: 2015-02-03 | Discharge: 2015-02-03 | Disposition: A | Discharge status: Other Acute Inpatient Hospital | Payer: BC Managed Care – PPO | Source: Home / Self Care | Attending: Family Medicine | Admitting: Family Medicine

## 2015-02-03 ENCOUNTER — Encounter (HOSPITAL_COMMUNITY): Payer: Self-pay

## 2015-02-03 ENCOUNTER — Emergency Department (HOSPITAL_COMMUNITY): Payer: BC Managed Care – PPO

## 2015-02-03 DIAGNOSIS — J189 Pneumonia, unspecified organism: Secondary | ICD-10-CM

## 2015-02-03 DIAGNOSIS — I5023 Acute on chronic systolic (congestive) heart failure: Secondary | ICD-10-CM | POA: Insufficient documentation

## 2015-02-03 DIAGNOSIS — R002 Palpitations: Secondary | ICD-10-CM | POA: Diagnosis present

## 2015-02-03 DIAGNOSIS — I5022 Chronic systolic (congestive) heart failure: Secondary | ICD-10-CM

## 2015-02-03 DIAGNOSIS — I272 Other secondary pulmonary hypertension: Secondary | ICD-10-CM | POA: Insufficient documentation

## 2015-02-03 DIAGNOSIS — I509 Heart failure, unspecified: Secondary | ICD-10-CM

## 2015-02-03 DIAGNOSIS — N183 Chronic kidney disease, stage 3 unspecified: Secondary | ICD-10-CM

## 2015-02-03 DIAGNOSIS — Z9114 Patient's other noncompliance with medication regimen: Secondary | ICD-10-CM | POA: Insufficient documentation

## 2015-02-03 DIAGNOSIS — I4891 Unspecified atrial fibrillation: Secondary | ICD-10-CM | POA: Diagnosis not present

## 2015-02-03 DIAGNOSIS — I10 Essential (primary) hypertension: Secondary | ICD-10-CM | POA: Diagnosis not present

## 2015-02-03 DIAGNOSIS — I129 Hypertensive chronic kidney disease with stage 1 through stage 4 chronic kidney disease, or unspecified chronic kidney disease: Secondary | ICD-10-CM | POA: Diagnosis not present

## 2015-02-03 DIAGNOSIS — R0609 Other forms of dyspnea: Secondary | ICD-10-CM

## 2015-02-03 HISTORY — DX: Essential (primary) hypertension: I10

## 2015-02-03 LAB — CBC WITH DIFFERENTIAL/PLATELET
Basophils Absolute: 0 10*3/uL (ref 0.0–0.1)
Basophils Relative: 1 % (ref 0–1)
Eosinophils Absolute: 0.2 10*3/uL (ref 0.0–0.7)
Eosinophils Relative: 3 % (ref 0–5)
HEMATOCRIT: 42.4 % (ref 39.0–52.0)
HEMOGLOBIN: 14.8 g/dL (ref 13.0–17.0)
LYMPHS PCT: 28 % (ref 12–46)
Lymphs Abs: 1.7 10*3/uL (ref 0.7–4.0)
MCH: 31.7 pg (ref 26.0–34.0)
MCHC: 34.9 g/dL (ref 30.0–36.0)
MCV: 90.8 fL (ref 78.0–100.0)
MONO ABS: 0.5 10*3/uL (ref 0.1–1.0)
MONOS PCT: 8 % (ref 3–12)
Neutro Abs: 3.6 10*3/uL (ref 1.7–7.7)
Neutrophils Relative %: 60 % (ref 43–77)
Platelets: 160 10*3/uL (ref 150–400)
RBC: 4.67 MIL/uL (ref 4.22–5.81)
RDW: 14.4 % (ref 11.5–15.5)
WBC: 6 10*3/uL (ref 4.0–10.5)

## 2015-02-03 LAB — COMPREHENSIVE METABOLIC PANEL
ALT: 38 U/L (ref 17–63)
ANION GAP: 10 (ref 5–15)
AST: 37 U/L (ref 15–41)
Albumin: 3.7 g/dL (ref 3.5–5.0)
Alkaline Phosphatase: 58 U/L (ref 38–126)
BUN: 13 mg/dL (ref 6–20)
CO2: 24 mmol/L (ref 22–32)
CREATININE: 1.52 mg/dL — AB (ref 0.61–1.24)
Calcium: 8.7 mg/dL — ABNORMAL LOW (ref 8.9–10.3)
Chloride: 107 mmol/L (ref 101–111)
GFR, EST AFRICAN AMERICAN: 58 mL/min — AB (ref >60)
GFR, EST NON AFRICAN AMERICAN: 50 mL/min — AB (ref >60)
Glucose, Bld: 102 mg/dL — ABNORMAL HIGH (ref 70–99)
Potassium: 3.8 mmol/L (ref 3.5–5.1)
Sodium: 141 mmol/L (ref 135–145)
TOTAL PROTEIN: 6.4 g/dL — AB (ref 6.5–8.1)
Total Bilirubin: 1.8 mg/dL — ABNORMAL HIGH (ref 0.3–1.2)

## 2015-02-03 LAB — BRAIN NATRIURETIC PEPTIDE: B NATRIURETIC PEPTIDE 5: 257.9 pg/mL — AB (ref 0.0–100.0)

## 2015-02-03 LAB — MAGNESIUM: Magnesium: 1.8 mg/dL (ref 1.7–2.4)

## 2015-02-03 LAB — TROPONIN I

## 2015-02-03 MED ORDER — SODIUM CHLORIDE 0.9 % IV SOLN
Freq: Once | INTRAVENOUS | Status: AC
  Administered 2015-02-03: 16:00:00 via INTRAVENOUS

## 2015-02-03 MED ORDER — LOSARTAN POTASSIUM 50 MG PO TABS
100.0000 mg | ORAL_TABLET | Freq: Every day | ORAL | Status: DC
  Administered 2015-02-04: 100 mg via ORAL
  Filled 2015-02-03 (×2): qty 2

## 2015-02-03 MED ORDER — ALLOPURINOL 100 MG PO TABS: 100.0000 mg | ORAL_TABLET | Freq: Every day | ORAL | Status: DC | PRN

## 2015-02-03 MED ORDER — AZITHROMYCIN 250 MG PO TABS
500.0000 mg | ORAL_TABLET | Freq: Once | ORAL | Status: AC
  Administered 2015-02-03: 500 mg via ORAL
  Filled 2015-02-03: qty 2

## 2015-02-03 MED ORDER — HYDROCODONE-ACETAMINOPHEN 7.5-325 MG PO TABS
1.0000 | ORAL_TABLET | ORAL | Status: DC | PRN
  Administered 2015-02-03: 1 via ORAL
  Filled 2015-02-03: qty 1

## 2015-02-03 MED ORDER — METOPROLOL TARTRATE 25 MG PO TABS
100.0000 mg | ORAL_TABLET | Freq: Once | ORAL | Status: AC
  Administered 2015-02-03: 100 mg via ORAL
  Filled 2015-02-03: qty 4

## 2015-02-03 MED ORDER — FAMOTIDINE 20 MG PO TABS
20.0000 mg | ORAL_TABLET | Freq: Two times a day (BID) | ORAL | Status: DC
  Administered 2015-02-03 – 2015-02-06 (×6): 20 mg via ORAL
  Filled 2015-02-03 (×7): qty 1

## 2015-02-03 MED ORDER — METOPROLOL TARTRATE 1 MG/ML IV SOLN
5.0000 mg | Freq: Once | INTRAVENOUS | Status: AC
  Administered 2015-02-03: 5 mg via INTRAVENOUS
  Filled 2015-02-03: qty 5

## 2015-02-03 MED ORDER — DEXTROSE 5 % IV SOLN
1.0000 g | Freq: Once | INTRAVENOUS | Status: AC
  Administered 2015-02-03: 1 g via INTRAVENOUS
  Filled 2015-02-03: qty 10

## 2015-02-03 MED ORDER — ENSURE ENLIVE PO LIQD
237.0000 mL | Freq: Two times a day (BID) | ORAL | Status: DC
  Administered 2015-02-04 – 2015-02-06 (×6): 237 mL via ORAL

## 2015-02-03 MED ORDER — APIXABAN 5 MG PO TABS
5.0000 mg | ORAL_TABLET | Freq: Two times a day (BID) | ORAL | Status: DC
  Administered 2015-02-03 – 2015-02-06 (×6): 5 mg via ORAL
  Filled 2015-02-03 (×7): qty 1

## 2015-02-03 MED ORDER — METOPROLOL TARTRATE 100 MG PO TABS
100.0000 mg | ORAL_TABLET | Freq: Two times a day (BID) | ORAL | Status: DC
  Administered 2015-02-04 – 2015-02-05 (×4): 100 mg via ORAL
  Filled 2015-02-03 (×6): qty 1

## 2015-02-03 MED ORDER — LOSARTAN POTASSIUM-HCTZ 100-12.5 MG PO TABS: 1.0000 | ORAL_TABLET | Freq: Every day | ORAL | Status: DC

## 2015-02-03 MED ORDER — HYDROCHLOROTHIAZIDE 12.5 MG PO CAPS
12.5000 mg | ORAL_CAPSULE | Freq: Every day | ORAL | Status: DC
  Administered 2015-02-04: 12.5 mg via ORAL
  Filled 2015-02-03 (×2): qty 1

## 2015-02-03 NOTE — ED Provider Notes (Addendum)
CSN: [id number]     Arrival date & time 02/03/15  1629 History   First MD Initiated Contact with Patient 02/03/15 1636     Chief Complaint  Patient presents with  . Atrial Fibrillation  . Tachycardia     (Consider location/radiation/quality/duration/timing/severity/associated sxs/prior Treatment) HPI Comments: Pt with hx of afib on eliquis presents with 1 week of SOB with moderate exertion, mild swelling in the legs and tiredness with cough.  Pt denies CP, fever or sputum production.  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  He denies any difficulty with walking but states if he lifts something and tries to walk he becomes tired and SOB easily.  He has been taking hyzaar and eliquis for HTN and a.fib but denies taking metoprolol.  He will feel his heart race intermittently most currently now.  Patient is a 54 y.o. male presenting with atrial fibrillation. The history is provided by the patient.  Atrial Fibrillation Associated symptoms include shortness of breath. Pertinent negatives include no chest pain and no abdominal pain.    Past Medical History  Diagnosis Date  . Gout   . Hypertension   . PUD (peptic ulcer disease)   . Atrial fibrillation    Past Surgical History  Procedure Laterality Date  . Toe surgery    . Cardioversion N/A 12/20/2013    Procedure: CARDIOVERSION;  Surgeon: Lewayne Bunting, MD;  Location: [facility] ENDOSCOPY;  Service: Cardiovascular;  Laterality: N/A;   Family History  Problem Relation Age of Onset  . Kidney disease Mother   . Diabetes Mother   . Hypertension Father   . Kidney disease Sister   . Stroke Sister   . Drug abuse Brother   . Heart disease Brother     Murmur  . Heart disease Father     CHF   History  Substance Use Topics  . Smoking status: Never Smoker   . Smokeless tobacco: Not on file  . Alcohol Use: Yes     Comment: Occasional    Review of Systems  Constitutional:  Positive for fatigue. Negative for fever, chills and appetite change.  Respiratory: Positive for shortness of breath. Negative for cough and chest tightness.   Cardiovascular: Positive for palpitations and leg swelling. Negative for chest pain.  Gastrointestinal: Negative for nausea, vomiting and abdominal pain.  All other systems reviewed and are negative.     Allergies  Review of patient's allergies indicates no known allergies.  Home Medications   Prior to Admission medications   Medication Sig Start Date End Date Taking? Authorizing Provider  allopurinol (ZYLOPRIM) 100 MG tablet Take 100 mg by mouth as needed.    Historical Provider, MD  ELIQUIS 5 MG TABS tablet Take 1 tablet by mouth twice daily, will need OV for further refills 11/21/14   Lewayne Bunting, MD  HYDROcodone-acetaminophen (NORCO) 7.5-325 MG per tablet Take 1 tablet by mouth every 4 (four) hours as needed. 12/08/14   Charm Rings, MD  ibuprofen (ADVIL,MOTRIN) 600 MG tablet Take 1 tablet (600 mg total) by mouth 3 (three) times daily. 12/08/14   Charm Rings, MD  losartan-hydrochlorothiazide (HYZAAR) 100-12.5 MG per tablet TAKE 1 TABLET BY MOUTH DAILY FOR BLOOD PRESSURE. 01/23/15   Lewayne Bunting, MD  metoprolol tartrate (LOPRESSOR) 50 MG tablet Take 1 tablet (50 mg total) by mouth 2 (two) times daily. 01/16/14   Lewayne Bunting, MD  ranitidine (ZANTAC) 150 MG capsule Take once or twice a day as needed for your stomach 10/17/13   Gwenlyn Found Copland, MD   BP 179/142 mmHg  Pulse 115  Temp(Src) 97.9 F (36.6 C) (Oral)  Resp 17  SpO2 100% Physical Exam  Constitutional: He is oriented to person, place, and time. He appears well-developed and well-nourished. No distress.  HENT:  Head: Normocephalic and atraumatic.  Mouth/Throat: Oropharynx is clear and moist.  Eyes: Conjunctivae and EOM are normal. Pupils are equal, round, and reactive to light.  Neck: Normal range of motion. Neck supple.  Cardiovascular: Intact distal  pulses.  An irregularly irregular rhythm present. Tachycardia present.   No murmur heard. Pulmonary/Chest: Effort normal and breath sounds normal. No respiratory distress. He has no wheezes. He has no rales.  Abdominal: Soft. He exhibits no distension. There is no tenderness. There is no rebound and no guarding.  Musculoskeletal: Normal range of motion. He exhibits edema. He exhibits no tenderness.  Trace edema bilaterally  Neurological: He is alert and oriented to person, place, and time.  Skin: Skin is warm and dry. No rash noted. No erythema.  Psychiatric: He has a normal mood and affect. His behavior is normal.  Nursing note and vitals reviewed.   ED Course  Procedures (including critical care time) Labs Review Labs Reviewed  COMPREHENSIVE METABOLIC PANEL - Abnormal; Notable for the following:    Glucose, Bld 102 (*)    Creatinine, Ser 1.52 (*)    Calcium 8.7 (*)    Total Protein 6.4 (*)    Total Bilirubin 1.8 (*)    GFR calc non Af Amer 50 (*)    GFR calc Af Amer 58 (*)    All other components within normal limits  BRAIN NATRIURETIC PEPTIDE - Abnormal; Notable for the following:    B Natriuretic Peptide 257.9 (*)    All other components within normal limits  CBC WITH DIFFERENTIAL/PLATELET  MAGNESIUM    Imaging Review Dg Chest 2 View  02/03/2015   CLINICAL DATA:  Cough and congestion  EXAM: CHEST  2 VIEW  COMPARISON:  10/17/2013  FINDINGS: Cardiac shadow remains enlarged. Patchy changes are noted in the right upper lobe and left perihilar region of the lower lobe. No sizable effusion is seen. No bony abnormality is noted.  IMPRESSION: Patchy early infiltrates in the right upper and left lower lobes.   Electronically Signed   By: Alcide Clever M.D.   On: 02/03/2015 18:26     EKG Interpretation   Date/Time:  Saturday Feb 03 2015 16:32:06 EDT Ventricular Rate:  124 PR Interval:    QRS Duration: 95 QT Interval:  342 QTC Calculation: 491 R Axis:   -56 Text Interpretation:   Atrial fibrillation Left anterior fascicular block  LVH with secondary repolarization abnormality Borderline prolonged QT  interval No significant change since last tracing Confirmed by Anitra Lauth   MD, Alphonzo Lemmings ([id number]) on 02/03/2015 5:03:11 PM      MDM   Final diagnoses:  Palpitations  Atrial fibrillation with RVR  Essential hypertension  CAP (community acquired pneumonia)    Pt with hx of afib s/p cardioversion last year who went back into a.fib and is on eliquis presents with weakness, SOB, palpitations and a.fib rvr for the last 1 week.  Pt states he will intermittently get palpitations but says he thought he may have bronchitis due to feeling SOB and cough.  Pt denies significant fever or chills.  He is taking his meds hyzaar and  eliquis however on further review pt is supposed to be taking lopressor  bid and he has not been on that.    Pt denies ever having chest pain but has noted some swelling in the lower legs in the last week.  Pt here has irregular heart rate between 125-140.  O/w stable without significant rales or lower ext edema.  Pt given IV lopressor  and  tablet.  Based on last cardiology visit with Dr. Jens Som pt was going to do a trial with hyzaar and metoprolol due to being assymptomatic and would switch to flecanide and cardioversion if he became symptomatic.  CBC, CMP, BNP, Mag, CXR and EKG pending.  6:50 PM Labs without acute findings. BNP minimally elevated at 250. However on chest x-ray patient found to have early infiltrate in the right upper and left lower lobes which is concerning for developing pneumonia especially with the cough and shortness of breath.  7:15 PM Patient treated with Rocephin and azithromycin for community-acquired pneumonia. After Lopressor IV and by mouth patient's heart rate has been maintained less than 100. However patient's blood pressures continued to be elevated and that's with taking his home medication and getting  metoprolol. Feel patient needs admission for pneumonia treatment as well as treatment of his hypertension and A. fib.  Gwyneth Sprout, MD 02/03/15 1916  Gwyneth Sprout, MD 02/03/15 [phone]

## 2015-02-03 NOTE — Progress Notes (Signed)
02/03/2015 8:50 PM  Patient arrived to the unit via stretcher. Patient oriented to staff, unit and room. Patient alert and oriented x4. Patient placed on 2 L of oxygen. Wife at bedside. RN will continue to monitor.  Veatrice Kells, RN  Glean Salen [phone]

## 2015-02-03 NOTE — ED Notes (Signed)
Pt arrived via EMS from urgent care HR in 140's A-fib.  C/O weakness x1 week.  Denies any chest pain.  Mowed 3 yards today then had some shortness of breath.

## 2015-02-03 NOTE — ED Notes (Signed)
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

## 2015-02-03 NOTE — ED Provider Notes (Signed)
CSN: [id number]     Arrival date & time 02/03/15  1459 History   First MD Initiated Contact with Patient 02/03/15 1516     Chief Complaint  Patient presents with  . Muscle Pain   (Consider location/radiation/quality/duration/timing/severity/associated sxs/prior Treatment) HPI Comments: 54 year old male states that he is having some problems with breathing. He is a poor historian and is difficult to get a good description. 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 He denies having chest pain and initially denied having pain when taking a deep breath but changed his story several minutes later stating that on occasion when taking a deep breath he has pain in the left anterior chest. Otherwise he denies heaviness, tightness, fullness or pressure. Denies cough. He states when he climbs set of stairs that he will be short of breath when he gets to the top, but he can mow the lawn without developing shortness of breath. He has a history of atrial fibrillation with rapid BR that was treated with cardioversion. He states that took care of that. He states he feels like sometimes that his heart beat is irregular.   Past Medical History  Diagnosis Date  . Gout   . Hypertension   . PUD (peptic ulcer disease)   . Atrial fibrillation    Past Surgical History  Procedure Laterality Date  . Toe surgery    . Cardioversion N/A 12/20/2013    Procedure: CARDIOVERSION;  Surgeon: Lewayne Bunting, MD;  Location: [facility] ENDOSCOPY;  Service: Cardiovascular;  Laterality: N/A;   Family History  Problem Relation Age of Onset  . Kidney disease Mother   . Diabetes Mother   . Hypertension Father   . Kidney disease Sister   . Stroke Sister   . Drug abuse Brother   . Heart disease Brother     Murmur  . Heart disease Father     CHF   History  Substance Use Topics  . Smoking status: Never Smoker   . Smokeless tobacco: Not on file  . Alcohol Use: Yes     Comment: Occasional     Review of Systems  Constitutional: Positive for activity change. Negative for fever and fatigue.  HENT: Positive for postnasal drip. Negative for sinus pressure and sore throat.   Respiratory: Positive for shortness of breath. Negative for cough, chest tightness and wheezing.   Cardiovascular: Positive for palpitations. Negative for chest pain and leg swelling.  Gastrointestinal: Negative.   Genitourinary: Negative.   Skin: Negative for rash.  Psychiatric/Behavioral: Negative.     Allergies  Review of patient's allergies indicates no known allergies.  Home Medications   Prior to Admission medications   Medication Sig Start Date End Date Taking? Authorizing Provider  ELIQUIS 5 MG TABS tablet Take 1 tablet by mouth twice daily, will need OV for further refills 11/21/14  Yes Lewayne Bunting, MD  losartan-hydrochlorothiazide (HYZAAR) 100-12.5 MG per tablet TAKE 1 TABLET BY MOUTH DAILY FOR BLOOD PRESSURE. 01/23/15  Yes Lewayne Bunting, MD  allopurinol (ZYLOPRIM) 100 MG tablet Take 100 mg by mouth as needed.    Historical Provider, MD  HYDROcodone-acetaminophen (NORCO) 7.5-325 MG per tablet Take 1 tablet by mouth every 4 (four) hours as needed. 12/08/14   Charm Rings, MD  ibuprofen (ADVIL,MOTRIN) 600 MG tablet Take 1 tablet (600 mg total) by mouth 3 (three) times daily. 12/08/14   Charm Rings, MD  metoprolol tartrate (LOPRESSOR) 50 MG tablet  Take 1 tablet (50 mg total) by mouth 2 (two) times daily. 01/16/14   Lewayne Bunting, MD  ranitidine (ZANTAC) 150 MG capsule Take once or twice a day as needed for your stomach 10/17/13   Gwenlyn Found Copland, MD   BP 165/131 mmHg  Pulse 90  Temp(Src) 98.5 F (36.9 C) (Oral)  Resp 16  SpO2 98% Physical Exam  Constitutional: He is oriented to person, place, and time. He appears well-developed and well-nourished. No distress.  HENT:  Mouth/Throat: Oropharynx is clear and moist. No oropharyngeal exudate.  Bilateral TMs are normal  Eyes: Conjunctivae  and EOM are normal.  Neck: Normal range of motion. Neck supple.  Cardiovascular: Normal heart sounds and intact distal pulses.   Rhythm is irregularly irregular  Pulmonary/Chest: Effort normal and breath sounds normal. No respiratory distress. He has no wheezes. He has no rales. He exhibits no tenderness.  Musculoskeletal: Normal range of motion. He exhibits no edema.  Lymphadenopathy:    He has no cervical adenopathy.  Neurological: He is alert and oriented to person, place, and time.  Skin: Skin is warm and dry.  Psychiatric: He has a normal mood and affect.  Nursing note and vitals reviewed.   ED Course  Procedures (including critical care time) Labs Review Labs Reviewed - No data to display  Imaging Review No results found. ED ECG REPORT   Date: 02/03/2015  Rate: 125  Rhythm: atrial fib  QRS Axis:   Intervals:   ST/T Wave abnormalities: T wave inversions lateral leads  Conduction Disutrbances:PVC's  Narrative Interpretation:   Old EKG Reviewed: Changes in lateral leads with t wave inversions  I have personally reviewed the EKG tracing and  with the computerized printout as noted.     MDM   1. Atrial fibrillation with RVR   2. Dyspnea on exertion    Transfer to [place] via EMS with an IV, O2 and monitor for evaluation and treatment of atrial fibrillation with rapid ventricular response.    Hayden Rasmussen, NP 02/03/15 [phone]

## 2015-02-03 NOTE — H&P (Signed)
[facility] History and Physical  DANIELA HERNAN ZOX:[id number] DOB: [birth year] DOA: 02/03/2015  Referring physician: EDP PCP: Erlinda Hong, MD   Chief Complaint: SOB   HPI: Javier Berry is a 55 y.o. male h/o A.Fib, on eliquis.  Patient presents to ED with 1 week of SOB, worse with moderate exertion or lying down flat, mild leg swelling, tiredness, non-productive cough.  No CP, no fever, no chills, no sputum production.  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  His BP in triage is 204/149, numerous subsequent readings with diastolic in the 120s-130s.  He is initially in A.Fib RVR.  He states over the past year he has had intermittent palpitations since he stopped taking metoprolol (patient misunderstood Dr. Jens Som at his last cardiology office visit 13 months ago, Dr. Jens Som told patient to stop Tambocor and increase metoprolol to  BID).  Patient got  of metoprolol in the ED, and his BP is now 150s/100, and he is now rate controlled in the 90s.  Review of Systems: Systems reviewed.  As above, otherwise negative  Past Medical History  Diagnosis Date  . Gout   . Hypertension   . PUD (peptic ulcer disease)   . Atrial fibrillation    Past Surgical History  Procedure Laterality Date  . Toe surgery    . Cardioversion N/A 12/20/2013    Procedure: CARDIOVERSION;  Surgeon: Lewayne Bunting, MD;  Location: [facility] ENDOSCOPY;  Service: Cardiovascular;  Laterality: N/A;   Social History:  reports that he has never smoked. He does not have any smokeless tobacco history on file. He reports that he drinks alcohol. He reports that he does not use illicit drugs.  No Known Allergies  Family History  Problem Relation Age of Onset  . Kidney disease Mother   . Diabetes Mother   . Hypertension Father   . Kidney disease Sister   . Stroke Sister   . Drug abuse Brother   . Heart disease Brother     Murmur  . Heart disease Father      CHF     Prior to Admission medications   Medication Sig Start Date End Date Taking? Authorizing Provider  allopurinol (ZYLOPRIM) 100 MG tablet Take 100 mg by mouth daily as needed (gout attacks).    Yes Historical Provider, MD  ELIQUIS 5 MG TABS tablet Take 1 tablet by mouth twice daily, will need OV for further refills Patient taking differently: Take 5 mg by mouth 2 (two) times daily.  11/21/14  Yes Lewayne Bunting, MD  HYDROcodone-acetaminophen (NORCO) 7.5-325 MG per tablet Take 1 tablet by mouth every 4 (four) hours as needed. Patient taking differently: Take 1 tablet by mouth every 4 (four) hours as needed (pain).  12/08/14  Yes Charm Rings, MD  losartan-hydrochlorothiazide (HYZAAR) 100-12.5 MG per tablet TAKE 1 TABLET BY MOUTH DAILY FOR BLOOD PRESSURE. 01/23/15  Yes Lewayne Bunting, MD  ranitidine (ZANTAC) 150 MG capsule Take once or twice a day as needed for your stomach Patient taking differently: Take 150 mg by mouth daily as needed (stomach pain/ulcers). Take once or twice a day as needed for your stomach 10/17/13  Yes Gwenlyn Found Copland, MD  metoprolol tartrate (LOPRESSOR) 50 MG tablet Take 1 tablet (50 mg total) by mouth 2 (two) times daily. Patient not taking: Reported on 02/03/2015 01/16/14   Lewayne Bunting, MD   Physical Exam: Filed Vitals:   02/03/15 1945  BP: 159/109  Pulse: 81  Temp:   Resp: 20    BP 159/109 mmHg  Pulse 81  Temp(Src) 97.9 F (36.6 C) (Oral)  Resp 20  Ht 6' (1.829 m)  Wt 99.791 kg (220 lb)  BMI 29.83 kg/m2  SpO2 98%  General Appearance:    Alert, oriented, no distress, appears stated age  Head:    Normocephalic, atraumatic  Eyes:    PERRL, EOMI, sclera non-icteric        Nose:   Nares without drainage or epistaxis. Mucosa, turbinates normal  Throat:   Moist mucous membranes. Oropharynx without erythema or exudate.  Neck:   Supple. No carotid bruits.  No thyromegaly.  No lymphadenopathy.   Back:     No CVA tenderness, no spinal tenderness   Lungs:     Diffuse expiratory crackles.  Chest wall:    No tenderness to palpitation  Heart:    Irregularly irregular, without murmurs, gallops, rubs  Abdomen:     Soft, non-tender, nondistended, normal bowel sounds, no organomegaly  Genitalia:    deferred  Rectal:    deferred  Extremities:   No clubbing, cyanosis or edema.  Pulses:   2+ and symmetric all extremities  Skin:   Skin color, texture, turgor normal, no rashes or lesions  Lymph nodes:   Cervical, supraclavicular, and axillary nodes normal  Neurologic:   CNII-XII intact. Normal strength, sensation and reflexes      throughout    Labs on Admission:  Basic Metabolic Panel:  Recent Labs Lab 02/03/15 1717  NA 141  K 3.8  CL 107  CO2 24  GLUCOSE 102*  BUN 13  CREATININE 1.52*  CALCIUM 8.7*  MG 1.8   Liver Function Tests:  Recent Labs Lab 02/03/15 1717  AST 37  ALT 38  ALKPHOS 58  BILITOT 1.8*  PROT 6.4*  ALBUMIN 3.7   No results for input(s): LIPASE, AMYLASE in the last 168 hours. No results for input(s): AMMONIA in the last 168 hours. CBC:  Recent Labs Lab 02/03/15 1717  WBC 6.0  NEUTROABS 3.6  HGB 14.8  HCT 42.4  MCV 90.8  PLT 160   Cardiac Enzymes: No results for input(s): CKTOTAL, CKMB, CKMBINDEX, TROPONINI in the last 168 hours.  BNP (last 3 results) No results for input(s): PROBNP in the last 8760 hours. CBG: No results for input(s): GLUCAP in the last 168 hours.  Radiological Exams on Admission: Dg Chest 2 View  02/03/2015   CLINICAL DATA:  Cough and congestion  EXAM: CHEST  2 VIEW  COMPARISON:  10/17/2013  FINDINGS: Cardiac shadow remains enlarged. Patchy changes are noted in the right upper lobe and left perihilar region of the lower lobe. No sizable effusion is seen. No bony abnormality is noted.  IMPRESSION: Patchy early infiltrates in the right upper and left lower lobes.   Electronically Signed   By: Alcide Clever M.D.   On: 02/03/2015 18:26    EKG: Independently  reviewed.  Assessment/Plan Principal Problem:   Hypertensive urgency Active Problems:   HTN (hypertension)   Atrial fibrillation with RVR   1. Hypertensive urgency - 1. Patients BP now controlled after  metoprolol in ED. 2. Will continue patient on metoprolol  BID for rate and BP control. 3. Continue home Hyzaar 4. Tele monitor 5. Checking troponins 2. A.Fib RVR - 1. rate controlled on metoprolol 2. Continue eliquis 3. Question of infiltrates on CXR - mild fluid overload is possible; however, PNA felt to be less  likely given #1 above, lack of fever or WBC, lack of productive cough, etc. 1. Got CAP coverage in ED 2. Holding off on additional ABx orders for now    Code Status: Full Code  Family Communication: No family in room Disposition Plan: Admit to obs   Time spent: 70 min  Kassius Battiste M. [facility] Pager [phone]  If 7AM-7PM, please contact the day team taking care of the patient [email or web address] Password [facility] 02/03/2015, 8:17 PM

## 2015-02-03 NOTE — ED Notes (Signed)
Care link called for transport to main ED

## 2015-02-04 ENCOUNTER — Observation Stay (HOSPITAL_COMMUNITY): Payer: BC Managed Care – PPO

## 2015-02-04 ENCOUNTER — Encounter (HOSPITAL_COMMUNITY): Payer: Self-pay | Admitting: Cardiology

## 2015-02-04 DIAGNOSIS — I509 Heart failure, unspecified: Secondary | ICD-10-CM | POA: Diagnosis not present

## 2015-02-04 DIAGNOSIS — N183 Chronic kidney disease, stage 3 unspecified: Secondary | ICD-10-CM

## 2015-02-04 DIAGNOSIS — I4891 Unspecified atrial fibrillation: Secondary | ICD-10-CM

## 2015-02-04 DIAGNOSIS — I5042 Chronic combined systolic (congestive) and diastolic (congestive) heart failure: Secondary | ICD-10-CM | POA: Diagnosis not present

## 2015-02-04 DIAGNOSIS — I10 Essential (primary) hypertension: Secondary | ICD-10-CM | POA: Diagnosis not present

## 2015-02-04 DIAGNOSIS — I5022 Chronic systolic (congestive) heart failure: Secondary | ICD-10-CM | POA: Diagnosis not present

## 2015-02-04 LAB — TROPONIN I: Troponin I: <0.03 ng/mL (ref <0.031)

## 2015-02-04 LAB — CBC
HCT: 38.5 % — ABNORMAL LOW (ref 39.0–52.0)
HEMOGLOBIN: 13.2 g/dL (ref 13.0–17.0)
MCH: 31.3 pg (ref 26.0–34.0)
MCHC: 34.3 g/dL (ref 30.0–36.0)
MCV: 91.2 fL (ref 78.0–100.0)
Platelets: 138 10*3/uL — ABNORMAL LOW (ref 150–400)
RBC: 4.22 MIL/uL (ref 4.22–5.81)
RDW: 14.6 % (ref 11.5–15.5)
WBC: 6.1 10*3/uL (ref 4.0–10.5)

## 2015-02-04 LAB — BASIC METABOLIC PANEL
Anion gap: 7 (ref 5–15)
BUN: 15 mg/dL (ref 6–20)
CALCIUM: 8.7 mg/dL — AB (ref 8.9–10.3)
CO2: 27 mmol/L (ref 22–32)
Chloride: 108 mmol/L (ref 101–111)
Creatinine, Ser: 1.55 mg/dL — ABNORMAL HIGH (ref 0.61–1.24)
GFR calc Af Amer: 57 mL/min — ABNORMAL LOW (ref >60)
GFR calc non Af Amer: 49 mL/min — ABNORMAL LOW (ref >60)
Glucose, Bld: 97 mg/dL (ref 70–99)
POTASSIUM: 3.6 mmol/L (ref 3.5–5.1)
Sodium: 142 mmol/L (ref 135–145)

## 2015-02-04 NOTE — Progress Notes (Signed)
02/04/2015 5:32 AM  Patient and wife were concerned about why the patient's blood pressure has been running high. RN educated patient and wife about noncompliance to medications at home and importance routinely taking medications to keep BP under control. Patient verbalized understanding. RN will continue to monitor patient.   Veatrice Kells, RN  [facility] Phone [phone]

## 2015-02-04 NOTE — Consult Note (Signed)
Primary cardiologist: Dr. [name] Consulting cardiologist: Dr. Jonelle Sidle  Reason for consultation: Atrial fibrillation, hypertension  Clinical Summary Javier Berry is a 54 y.o.male admitted to the hospital with recent history of increasing shortness of breath and leg edema. He states that he has noticed exertional dyspnea over the last 3 weeks or so, still remains functional in his jobs. 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 Within the last week, he has had more shortness of breath also associated with working outdoors and painting a room, thought that he might have "bronchitis." On evaluation in the ER he was noted to be significantly hypertensive, and also in rapid atrial fibrillation.  He last saw Dr. Jens Som in April 2015 at which time he was noted to be back in atrial fibrillation having undergone cardioversion in March 2015. At that point he was on Eliquis and Lopressor which was to increase from 25 mg to 50 mg twice daily. In speaking with him today, it sounds like he got confused about his medications at some point several months ago, and has not been on any beta blocker, although taking his Eliquis regularly by report. He has been placed on Lopressor 100 mg twice daily by the primary team.  Last echocardiogram is noted below. His cardiac markers are normal arguing against ACS. Chest x-ray suggests early infiltrates in the right upper and left lower lobes. Reports no fevers or chills, no productive cough.   No Known Allergies  Medications Scheduled Medications: . apixaban  5 mg Oral BID  . famotidine  20 mg Oral BID  . feeding supplement (ENSURE ENLIVE)  237 mL Oral BID BM  . losartan  100 mg Oral Daily   And  . hydrochlorothiazide  12.5 mg Oral Daily  . metoprolol tartrate  100 mg Oral BID    PRN Medications: HYDROcodone-acetaminophen   Past Medical History  Diagnosis Date  . Gout   .  Essential hypertension   . PUD (peptic ulcer disease)   . Atrial fibrillation     Past Surgical History  Procedure Laterality Date  . Toe surgery    . Cardioversion N/A 12/20/2013    Procedure: CARDIOVERSION;  Surgeon: Lewayne Bunting, MD;  Location: [facility] ENDOSCOPY;  Service: Cardiovascular;  Laterality: N/A;    Family History  Problem Relation Age of Onset  . Kidney disease Mother   . Diabetes Mother   . Hypertension Father   . Kidney disease Sister   . Stroke Sister   . Drug abuse Brother   . Heart disease Brother     Murmur  . Heart disease Father     CHF    Social History Javier Berry reports that he has never smoked. He does not have any smokeless tobacco history on file. Javier Berry reports that he drinks alcohol.  Review of Systems Complete review of systems negative except as otherwise outlined in the clinical summary.  Physical Examination Blood pressure 151/130, pulse 52, temperature 97.8 F (36.6 C), temperature source Oral, resp. rate 18, height 6' (1.829 m), weight 221 lb 14.4 oz (100.653 kg), SpO2 98 %.  Intake/Output Summary (Last 24 hours) at 02/04/15 0958 Last data filed at 02/04/15 0900  Gross per 24 hour  Intake    120 ml  Output      0 ml  Net    120 ml   Telemetry: Atrial fibrillation.  Gen.: Patient appears comfortable at rest. HEENT:  Conjunctiva and lids normal, oropharynx clear. Neck: Supple, elevated JVP, no carotid bruits, no thyromegaly. Lungs: Scattered rhonchi without wheezes, nonlabored breathing at rest. Cardiac: Irregularly irregular, no S3 or significant systolic murmur, no pericardial rub. Abdomen: Soft, nontender, bowel sounds present, no guarding or rebound. Extremities: Mild ankle edema, distal pulses 2+. Skin: Warm and dry. Musculoskeletal: No kyphosis. Neuropsychiatric: Alert and oriented x3, affect grossly appropriate.   Lab Results  Basic Metabolic Panel:  Recent Labs Lab 02/03/15 1717 02/04/15 0230  NA 141 142  K  3.8 3.6  CL 107 108  CO2 24 27  GLUCOSE 102* 97  BUN 13 15  CREATININE 1.52* 1.55*  CALCIUM 8.7* 8.7*  MG 1.8  --     Liver Function Tests:  Recent Labs Lab 02/03/15 1717  AST 37  ALT 38  ALKPHOS 58  BILITOT 1.8*  PROT 6.4*  ALBUMIN 3.7    CBC:  Recent Labs Lab 02/03/15 1717 02/04/15 0230  WBC 6.0 6.1  NEUTROABS 3.6  --   HGB 14.8 13.2  HCT 42.4 38.5*  MCV 90.8 91.2  PLT 160 138*    Cardiac Enzymes:  Recent Labs Lab 02/03/15 2017 02/04/15 0230 02/04/15 0800  TROPONINI <0.03 <0.03 <0.03    BNP: 257  ECG Tracing from 02/03/2015 showed atrial fibrillation with RVR, LVH with repolarization abnormalities, left anterior fascicular block.  Imaging  Chest x-ray 02/03/2015: FINDINGS: Cardiac shadow remains enlarged. Patchy changes are noted in the right upper lobe and left perihilar region of the lower lobe. No sizable effusion is seen. No bony abnormality is noted.  IMPRESSION: Patchy early infiltrates in the right upper and left lower lobes.  Echocardiogram 11/16/2013: Study Conclusions  - Left ventricle: The cavity size was normal. Wall thickness was increased in a pattern of moderate LVH. Systolic function was normal. The estimated ejection fraction was in the range of 60% to 65%. Wall motion was normal; there were no regional wall motion abnormalities. - Left atrium: The atrium was mildly to moderately dilated. - Right atrium: The atrium was mildly to moderately dilated.   Impression  1. Chronic atrial fibrillation, presentation with RVR associated with no rate control medications. CHADSVASC score is 1, he has been on Eliquis per Dr. Jens Som (had DCCV March 2015 with subsequent recurrent atrial fibrillation).   2. Essential hypertension, hypertensive urgency at presentation with uncontrolled blood pressures recently.  3. Abnormal chest x-ray, possible early infiltrates in the right upper and left lower lobes. Recent worsening  shortness of breath may be multifactorial. Patient is afebrile with normal white blood cell count. Currently not on standing antibiotics pr primary team.  Recommendations  Agree with reinstitution of beta blocker, otherwise continue ARB/diuretic combination. He reports being compliant with Eliquis, would continue. No plan for repeat attempt at cardioversion now. Needs follow-up echocardiogram, mainly to exclude associated cardiomyopathy with uncontrolled atrial fibrillation. Continue to adjust antihypertensive regimen for more optimal blood pressure control. Our service will follow with you.   Jonelle Sidle, M.D., F.A.C.C.

## 2015-02-04 NOTE — Progress Notes (Signed)
UR completed 

## 2015-02-04 NOTE — Progress Notes (Signed)
[facility] Progress Note   DAWES UMANA FXO:[id number] DOB: Jul 11, 1961 DOA: 02/03/2015 PCP: Erlinda Hong, MD  Brief narrative: Javier Berry is a 54 y.o. male with past medical history of atrial fibrillation status post failed cardioversion in March 2015- his last cardiology follow-up was in April 2015 when he was recommended to increase his metoprolol. The patient thought that he was asked to discontinue his metoprolol and has not taking any metoprolol since. He presented to the hospital with shortness of breath for at least one week and pedal edema and was found to have A. fib with RVR, blood pressure of 204/149 and mild infiltrates on chest x-ray.   Subjective: Currently no complaints of shortness of breath palpitations or chest pain.  Assessment/Plan: Principal Problem:   Hypertensive urgency -Metoprolol resumed on admission at a dose of 100 twice a day -Continue losartan/HCTZ which she has been taking at home  Active Problems:    Atrial fibrillation with RVR - Continue Lopressor and titrate based upon heart rate control -Continue Eliquis -  2-D echo ordered to rule out cardiomyopathy in relation to RVR -Cardiology consulting as well  Infiltrates on chest x-ray - Patchy early infiltrates in right upper and left lower lobes possibly second to pulmonary edema- no symptoms of cough fever and no leukocytosis to suggest that he has an underlying infection -Pulse ox is 98% on room air today and exam is not suggestive of pulmonary edema  CKD 3 - Creatinine is at baseline   Code Status: Full code  Family Communication: Wife at bedside Disposition Plan: Home when stable  DVT prophylaxis: Eliquis  Consultants: Cardiology  Procedures:  Antibiotics: Anti-infectives    Start     Dose/Rate Route Frequency Ordered Stop   02/03/15 1915  cefTRIAXone (ROCEPHIN) 1 g in dextrose 5 % 50 mL IVPB     1 g 100 mL/hr over 30 Minutes Intravenous  Once 02/03/15 1912 02/03/15 2026   02/03/15 1915  azithromycin (ZITHROMAX) tablet 500 mg     500 mg Oral  Once 02/03/15 1912 02/03/15 1941      Objective: Filed Weights   02/03/15 1832 02/03/15 2050  Weight: 99.791 kg (220 lb) 100.653 kg (221 lb 14.4 oz)    Intake/Output Summary (Last 24 hours) at 02/04/15 1127 Last data filed at 02/04/15 0900  Gross per 24 hour  Intake    120 ml  Output      0 ml  Net    120 ml     Vitals Filed Vitals:   02/03/15 2015 02/03/15 2050 02/04/15 0444 02/04/15 0950  BP: 159/112 149/120 151/130   Pulse: 46 97 52   Temp:   97.8 F (36.6 C)   TempSrc:   Oral   Resp: 18 20 18    Height:  6' (1.829 m)    Weight:  100.653 kg (221 lb 14.4 oz)    SpO2: 96% 98% 100% 98%    Exam:  General:  Pt is alert, not in acute distress  HEENT: No icterus, No thrush  Cardiovascular: IIRR, S1/S2 No murmur  Respiratory: clear to auscultation bilaterally   Abdomen: Soft, +Bowel sounds, non tender, non distended, no guarding  MSK: No LE edema, cyanosis or clubbing  Data Reviewed: Basic Metabolic Panel:  Recent Labs Lab 02/03/15 1717 02/04/15 0230  NA 141 142  K 3.8 3.6  CL 107 108  CO2 24 27  GLUCOSE 102* 97  BUN 13 15  CREATININE 1.52* 1.55*  CALCIUM 8.7* 8.7*  MG  1.8  --    Liver Function Tests:  Recent Labs Lab 02/03/15 1717  AST 37  ALT 38  ALKPHOS 58  BILITOT 1.8*  PROT 6.4*  ALBUMIN 3.7   No results for input(s): LIPASE, AMYLASE in the last 168 hours. No results for input(s): AMMONIA in the last 168 hours. CBC:  Recent Labs Lab 02/03/15 1717 02/04/15 0230  WBC 6.0 6.1  NEUTROABS 3.6  --   HGB 14.8 13.2  HCT 42.4 38.5*  MCV 90.8 91.2  PLT 160 138*   Cardiac Enzymes:  Recent Labs Lab 02/03/15 2017 02/04/15 0230 02/04/15 0800  TROPONINI <0.03 <0.03 <0.03   BNP (last 3 results)  Recent Labs  02/03/15 1717  BNP 257.9*    ProBNP (last 3 results) No results for input(s): PROBNP in the last 8760 hours.  CBG: No results for input(s): GLUCAP  in the last 168 hours.  No results found for this or any previous visit (from the past 240 hour(s)).   Studies: Dg Chest 2 View  02/03/2015   CLINICAL DATA:  Cough and congestion  EXAM: CHEST  2 VIEW  COMPARISON:  10/17/2013  FINDINGS: Cardiac shadow remains enlarged. Patchy changes are noted in the right upper lobe and left perihilar region of the lower lobe. No sizable effusion is seen. No bony abnormality is noted.  IMPRESSION: Patchy early infiltrates in the right upper and left lower lobes.   Electronically Signed   By: Alcide Clever M.D.   On: 02/03/2015 18:26    Scheduled Meds:  Scheduled Meds: . apixaban  5 mg Oral BID  . famotidine  20 mg Oral BID  . feeding supplement (ENSURE ENLIVE)  237 mL Oral BID BM  . losartan  100 mg Oral Daily   And  . hydrochlorothiazide  12.5 mg Oral Daily  . metoprolol tartrate  100 mg Oral BID   Continuous Infusions:   Time spent on care of this patient: 35 min  Javier Patino, MD 02/04/2015, 11:27 AM    [facility] Office  [phone] Pager - Text Page per [email or web address]  If 7PM-7AM, please contact night-coverage [email or web address]

## 2015-02-04 NOTE — Progress Notes (Signed)
  Echocardiogram 2D Echocardiogram has been performed.  Delcie Roch 02/04/2015, 3:47 PM

## 2015-02-05 DIAGNOSIS — I4891 Unspecified atrial fibrillation: Secondary | ICD-10-CM | POA: Diagnosis not present

## 2015-02-05 DIAGNOSIS — I5022 Chronic systolic (congestive) heart failure: Secondary | ICD-10-CM

## 2015-02-05 DIAGNOSIS — N183 Chronic kidney disease, stage 3 (moderate): Secondary | ICD-10-CM | POA: Diagnosis not present

## 2015-02-05 DIAGNOSIS — I10 Essential (primary) hypertension: Secondary | ICD-10-CM | POA: Diagnosis not present

## 2015-02-05 MED ORDER — HYDROCHLOROTHIAZIDE 25 MG PO TABS
25.0000 mg | ORAL_TABLET | Freq: Every day | ORAL | Status: DC
  Administered 2015-02-05: 25 mg via ORAL
  Filled 2015-02-05 (×2): qty 1

## 2015-02-05 MED ORDER — LOSARTAN POTASSIUM 50 MG PO TABS
100.0000 mg | ORAL_TABLET | Freq: Every day | ORAL | Status: DC
  Administered 2015-02-05 – 2015-02-06 (×2): 100 mg via ORAL
  Filled 2015-02-05 (×2): qty 2

## 2015-02-05 MED ORDER — OFF THE BEAT BOOK
Freq: Once | Status: AC
  Administered 2015-02-05: 09:00:00
  Filled 2015-02-05: qty 1

## 2015-02-05 MED ORDER — REGADENOSON 0.4 MG/5ML IV SOLN
0.4000 mg | Freq: Once | INTRAVENOUS | Status: DC
  Filled 2015-02-05: qty 5

## 2015-02-05 NOTE — Progress Notes (Addendum)
[facility] Progress Note   Javier Berry:[id number] DOB: March 15, 1961 DOA: 02/03/2015 PCP: Erlinda Hong, MD  Brief narrative: Javier Berry is a 54 y.o. male with past medical history of atrial fibrillation status post failed cardioversion in March 2015- his last cardiology follow-up was in April 2015 when he was recommended to increase his metoprolol. The patient thought that he was asked to discontinue his metoprolol and has not taking any metoprolol since. He presented to the hospital with shortness of breath for at least one week and pedal edema and was found to have A. fib with RVR, blood pressure of 204/149 and mild infiltrates on chest x-ray.   Subjective: No complaints of palpitations, shortness of breath or chest pain. Overall feels well.   Assessment/Plan: Principal Problem:   Hypertensive urgency -Metoprolol resumed on admission at a dose of 100 twice a day -Continue losartan/HCTZ which he has been taking at home - BP still elevated with a high diastolic component- HCTZ increased from 12.5 to 25   Active Problems:    Atrial fibrillation with RVR - Continue Lopressor at 100 mg BID - HR 90-100 today at rest- cont to follow on Metoprolol -Continue Eliquis -  2-D echo ordered - see below  -Cardiology consulting as well  Systolic CHF - EF 35- 40% - diffuse hypokinesis, RV systolic function mild to moderately reduces as well, Grade 2 diastolic dysfunction - Patchy early infiltrates in right upper and left lower lobes possibly second to pulmonary edema- no symptoms of cough fever and no leukocytosis to suggest that he has an underlying infection -Pulse ox is 98% on room air and exam is not suggestive of pulmonary edema - plans for myoview tomorrow to r/u underlying ischemia - already on an ARB   CKD 3 - Creatinine is at baseline   Code Status: Full code  Family Communication: Wife at bedside Disposition Plan: Home when stable  DVT prophylaxis: Eliquis   Consultants: Cardiology  Procedures: 2D ECHO  Antibiotics: Anti-infectives    Start     Dose/Rate Route Frequency Ordered Stop   02/03/15 1915  cefTRIAXone (ROCEPHIN) 1 g in dextrose 5 % 50 mL IVPB     1 g 100 mL/hr over 30 Minutes Intravenous  Once 02/03/15 1912 02/03/15 2026   02/03/15 1915  azithromycin (ZITHROMAX) tablet 500 mg     500 mg Oral  Once 02/03/15 1912 02/03/15 1941      Objective: Filed Weights   02/03/15 1832 02/03/15 2050  Weight: 99.791 kg (220 lb) 100.653 kg (221 lb 14.4 oz)    Intake/Output Summary (Last 24 hours) at 02/05/15 1347 Last data filed at 02/04/15 1802  Gross per 24 hour  Intake    240 ml  Output      2 ml  Net    238 ml     Vitals Filed Vitals:   02/04/15 1514 02/04/15 1943 02/05/15 0447 02/05/15 0942  BP: 159/138 131/118 141/109 159/105  Pulse: 90 97 96 90  Temp: 98.2 F (36.8 C) 98 F (36.7 C) 98.4 F (36.9 C)   TempSrc: Oral Oral Oral   Resp: 18 20 18    Height:      Weight:      SpO2: 94% 99% 98%     Exam:  General:  Pt is alert, not in acute distress  HEENT: No icterus, No thrush  Cardiovascular: IIRR, S1/S2 No murmur  Respiratory: clear to auscultation bilaterally   Abdomen: Soft, +Bowel sounds, non tender, non distended, no  guarding  MSK: No LE edema, cyanosis or clubbing  Data Reviewed: Basic Metabolic Panel:  Recent Labs Lab 02/03/15 1717 02/04/15 0230  NA 141 142  K 3.8 3.6  CL 107 108  CO2 24 27  GLUCOSE 102* 97  BUN 13 15  CREATININE 1.52* 1.55*  CALCIUM 8.7* 8.7*  MG 1.8  --    Liver Function Tests:  Recent Labs Lab 02/03/15 1717  AST 37  ALT 38  ALKPHOS 58  BILITOT 1.8*  PROT 6.4*  ALBUMIN 3.7   No results for input(s): LIPASE, AMYLASE in the last 168 hours. No results for input(s): AMMONIA in the last 168 hours. CBC:  Recent Labs Lab 02/03/15 1717 02/04/15 0230  WBC 6.0 6.1  NEUTROABS 3.6  --   HGB 14.8 13.2  HCT 42.4 38.5*  MCV 90.8 91.2  PLT 160 138*   Cardiac  Enzymes:  Recent Labs Lab 02/03/15 2017 02/04/15 0230 02/04/15 0800  TROPONINI <0.03 <0.03 <0.03   BNP (last 3 results)  Recent Labs  02/03/15 1717  BNP 257.9*    ProBNP (last 3 results) No results for input(s): PROBNP in the last 8760 hours.  CBG: No results for input(s): GLUCAP in the last 168 hours.  No results found for this or any previous visit (from the past 240 hour(s)).   Studies: Dg Chest 2 View  02/03/2015   CLINICAL DATA:  Cough and congestion  EXAM: CHEST  2 VIEW  COMPARISON:  10/17/2013  FINDINGS: Cardiac shadow remains enlarged. Patchy changes are noted in the right upper lobe and left perihilar region of the lower lobe. No sizable effusion is seen. No bony abnormality is noted.  IMPRESSION: Patchy early infiltrates in the right upper and left lower lobes.   Electronically Signed   By: Alcide Clever M.D.   On: 02/03/2015 18:26    Scheduled Meds:  Scheduled Meds: . apixaban  5 mg Oral BID  . famotidine  20 mg Oral BID  . feeding supplement (ENSURE ENLIVE)  237 mL Oral BID BM  . hydrochlorothiazide  25 mg Oral Daily   And  . losartan  100 mg Oral Daily  . metoprolol tartrate  100 mg Oral BID  . [START ON 02/06/2015] regadenoson  0.4 mg Intravenous Once   Continuous Infusions:   Time spent on care of this patient: 35 min  Amory Simonetti, MD 02/05/2015, 1:47 PM    [facility] Office  [phone] Pager - Text Page per [email or web address]  If 7PM-7AM, please contact night-coverage [email or web address]

## 2015-02-05 NOTE — Progress Notes (Signed)
PROGRESS NOTE  Subjective:    Javier Berry is a 54 y.o.male admitted to the hospital with recent history of increasing shortness of breath and leg edema. He states that he has noticed exertional dyspnea over the last 3 weeks or so, still remains functional in his jobs. 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 Within the last week, he has had more shortness of breath also associated with working outdoors and painting a room, thought that he might have "bronchitis." On evaluation in the ER he was noted to be significantly hypertensive, and also in rapid atrial fibrillation.  He was seen by Dr. Jens Som a year ago - was give instructions to increase metoprolol but instead , he DC'd the metoprolol . Has has had rapid AFib and now has developed acute CHF ( likely due to his tachycardia.)   Objective:    Vital Signs:   Temp:  [98 F (36.7 C)-98.4 F (36.9 C)] 98.4 F (36.9 C) (05/09 0447) Pulse Rate:  [90-97] 96 (05/09 0447) Resp:  [18-20] 18 (05/09 0447) BP: (131-159)/(109-138) 141/109 mmHg (05/09 0447) SpO2:  [94 %-99 %] 98 % (05/09 0447)      24-hour weight change:  Weight change:   Weight trends: Filed Weights   02/03/15 1832 02/03/15 2050  Weight: 220 lb (99.791 kg) 221 lb 14.4 oz (100.653 kg)    Intake/Output:  05/08 0701 - 05/09 0700 In: 360 [P.O.:360] Out: 2 [Urine:2]     Physical Exam: BP 141/109 mmHg  Pulse 96  Temp(Src) 98.4 F (36.9 C) (Oral)  Resp 18  Ht 6' (1.829 m)  Wt 221 lb 14.4 oz (100.653 kg)  BMI 30.09 kg/m2  SpO2 98%  Wt Readings from Last 3 Encounters:  02/03/15 221 lb 14.4 oz (100.653 kg)  01/16/14 225 lb 12.8 oz (102.422 kg)  12/20/13 220 lb (99.791 kg)    General: Vital signs reviewed and noted.   Head: Normocephalic, atraumatic.  Eyes: conjunctivae/corneas clear.  EOM's intact.   Throat: normal  Neck:  normal   Lungs:    clear   Heart:  irreg. Irreg. ,   Abdomen:   Soft, non-tender, non-distended    Extremities: No edema    Neurologic: A&O X3, CN II - XII are grossly intact.   Psych: Normal     Labs: BMET:  Recent Labs  02/03/15 1717 02/04/15 0230  NA 141 142  K 3.8 3.6  CL 107 108  CO2 24 27  GLUCOSE 102* 97  BUN 13 15  CREATININE 1.52* 1.55*  CALCIUM 8.7* 8.7*  MG 1.8  --     Liver function tests:  Recent Labs  02/03/15 1717  AST 37  ALT 38  ALKPHOS 58  BILITOT 1.8*  PROT 6.4*  ALBUMIN 3.7   No results for input(s): LIPASE, AMYLASE in the last 72 hours.  CBC:  Recent Labs  02/03/15 1717 02/04/15 0230  WBC 6.0 6.1  NEUTROABS 3.6  --   HGB 14.8 13.2  HCT 42.4 38.5*  MCV 90.8 91.2  PLT 160 138*    Cardiac Enzymes:  Recent Labs  02/03/15 2017 02/04/15 0230 02/04/15 0800  TROPONINI <0.03 <0.03 <0.03    Coagulation Studies: No results for input(s): LABPROT, INR in the last 72 hours.  Other: Invalid input(s): POCBNP No results for input(s): DDIMER in the last 72 hours. No results for input(s): HGBA1C in the last 72 hours. No results for  input(s): CHOL, HDL, LDLCALC, TRIG, CHOLHDL in the last 72 hours. No results for input(s): TSH, T4TOTAL, T3FREE, THYROIDAB in the last 72 hours.  Invalid input(s): FREET3 No results for input(s): VITAMINB12, FOLATE, FERRITIN, TIBC, IRON, RETICCTPCT in the last 72 hours.   Other results:  Tele   ( personally reviewed )  Atrial fib at rate of 95  Medications:    Infusions:    Scheduled Medications: . apixaban  5 mg Oral BID  . famotidine  20 mg Oral BID  . feeding supplement (ENSURE ENLIVE)  237 mL Oral BID BM  . hydrochlorothiazide  25 mg Oral Daily   And  . losartan  100 mg Oral Daily  . metoprolol tartrate  100 mg Oral BID  . off the beat book   Does not apply Once   Echo: 02/04/15   - Left ventricle: The cavity size was normal. Wall thickness was increased in a pattern of mild LVH. Systolic function was moderately reduced. The estimated ejection  fraction was in the range of 35% to 40%. Diffuse hypokinesis. The study is not technically sufficient to allow evaluation of LV diastolic function. - Mitral valve: There was mild regurgitation. - Left atrium: The atrium was severely dilated. - Right ventricle: Systolic function was mildly to moderately reduced. - Right atrium: The atrium was moderately to severely dilated. Central venous pressure (est): 8 mm Hg. - Tricuspid valve: There was mild regurgitation. - Pulmonary arteries: PA peak pressure: 42 mm Hg (S). - Pericardium, extracardiac: There was no pericardial effusion   Assessment/ Plan:   Principal Problem:   Hypertensive urgency Active Problems:   HTN (hypertension)   Atrial fibrillation with RVR   CKD (chronic kidney disease) stage 3, GFR 30-59 ml/min  1. Acute on chronic systolic CHF:  Likely due to afib with RVR.  Will do a Lexiscan myoview to evaluate for CAD.  He does not have angina.  2. Atrial fib:  Has failed cardioversion.  Continue rate control and Eliquis.   3. Essential HTN:  Continue meds.  May change metoprolol to carvedilol if BP remains elevated.   4. Pulmonary Hypertension : likely due to his systolic CHF.    Disposition: for Steffanie Dunn tomorrow cont Length of Stay:   Alvia Grove., MD, [facility] 02/05/2015, 8:52 AM Office [phone] Pager [phone]

## 2015-02-06 ENCOUNTER — Other Ambulatory Visit (HOSPITAL_COMMUNITY): Payer: BC Managed Care – PPO

## 2015-02-06 ENCOUNTER — Other Ambulatory Visit: Payer: Self-pay | Admitting: Nurse Practitioner

## 2015-02-06 ENCOUNTER — Observation Stay (HOSPITAL_COMMUNITY): Payer: BC Managed Care – PPO

## 2015-02-06 DIAGNOSIS — I5042 Chronic combined systolic (congestive) and diastolic (congestive) heart failure: Secondary | ICD-10-CM

## 2015-02-06 DIAGNOSIS — N183 Chronic kidney disease, stage 3 unspecified: Secondary | ICD-10-CM

## 2015-02-06 DIAGNOSIS — I4891 Unspecified atrial fibrillation: Secondary | ICD-10-CM | POA: Diagnosis not present

## 2015-02-06 DIAGNOSIS — I509 Heart failure, unspecified: Secondary | ICD-10-CM | POA: Diagnosis not present

## 2015-02-06 DIAGNOSIS — I10 Essential (primary) hypertension: Secondary | ICD-10-CM | POA: Diagnosis not present

## 2015-02-06 LAB — NM MYOCAR MULTI W/SPECT W/WALL MOTION / EF
LHR: 0.33
LV sys vol: 86 mL
LVDIAVOL: 129 mL
NUC STRESS TID: 0.94
SDS: 0
SRS: 0
SSS: 0

## 2015-02-06 MED ORDER — SPIRONOLACTONE 25 MG PO TABS: 25.0000 mg | ORAL_TABLET | Freq: Every day | ORAL | Status: DC

## 2015-02-06 MED ORDER — LOSARTAN POTASSIUM 100 MG PO TABS: 100.0000 mg | ORAL_TABLET | Freq: Every day | ORAL | Status: DC

## 2015-02-06 MED ORDER — SPIRONOLACTONE 25 MG PO TABS
25.0000 mg | ORAL_TABLET | Freq: Two times a day (BID) | ORAL | Status: DC
  Filled 2015-02-06 (×3): qty 1

## 2015-02-06 MED ORDER — ISOSORBIDE MONONITRATE 15 MG HALF TABLET
15.0000 mg | ORAL_TABLET | Freq: Every day | ORAL | Status: DC
  Administered 2015-02-06: 15 mg via ORAL
  Filled 2015-02-06: qty 1

## 2015-02-06 MED ORDER — METOPROLOL SUCCINATE ER 100 MG PO TB24: 100.0000 mg | ORAL_TABLET | Freq: Every day | ORAL | Status: DC

## 2015-02-06 MED ORDER — REGADENOSON 0.4 MG/5ML IV SOLN
INTRAVENOUS | Status: AC
  Filled 2015-02-06: qty 5

## 2015-02-06 MED ORDER — TECHNETIUM TC 99M SESTAMIBI GENERIC - CARDIOLITE
10.0000 | Freq: Once | INTRAVENOUS | Status: AC | PRN
  Administered 2015-02-06: 10 via INTRAVENOUS

## 2015-02-06 MED ORDER — FUROSEMIDE 40 MG PO TABS: 40.0000 mg | ORAL_TABLET | Freq: Every day | ORAL | Status: DC

## 2015-02-06 MED ORDER — HYDRALAZINE HCL 10 MG PO TABS: 10.0000 mg | ORAL_TABLET | Freq: Three times a day (TID) | ORAL | Status: DC

## 2015-02-06 MED ORDER — REGADENOSON 0.4 MG/5ML IV SOLN
0.4000 mg | Freq: Once | INTRAVENOUS | Status: AC
  Administered 2015-02-06: 0.4 mg via INTRAVENOUS
  Filled 2015-02-06: qty 5

## 2015-02-06 MED ORDER — FUROSEMIDE 40 MG PO TABS
40.0000 mg | ORAL_TABLET | Freq: Every day | ORAL | Status: DC
  Administered 2015-02-06: 40 mg via ORAL
  Filled 2015-02-06: qty 1

## 2015-02-06 MED ORDER — ISOSORBIDE MONONITRATE ER 30 MG PO TB24: 15.0000 mg | ORAL_TABLET | Freq: Every day | ORAL | Status: DC

## 2015-02-06 MED ORDER — HYDRALAZINE HCL 20 MG/ML IJ SOLN
5.0000 mg | Freq: Once | INTRAMUSCULAR | Status: AC
  Administered 2015-02-06: 5 mg via INTRAVENOUS
  Filled 2015-02-06: qty 1

## 2015-02-06 MED ORDER — METOPROLOL SUCCINATE ER 100 MG PO TB24
100.0000 mg | ORAL_TABLET | Freq: Every day | ORAL | Status: DC
  Administered 2015-02-06: 100 mg via ORAL
  Filled 2015-02-06: qty 1

## 2015-02-06 MED ORDER — TECHNETIUM TC 99M SESTAMIBI GENERIC - CARDIOLITE
30.0000 | Freq: Once | INTRAVENOUS | Status: AC | PRN
  Administered 2015-02-06: 30 via INTRAVENOUS

## 2015-02-06 MED ORDER — HYDRALAZINE HCL 10 MG PO TABS
10.0000 mg | ORAL_TABLET | Freq: Three times a day (TID) | ORAL | Status: DC
  Administered 2015-02-06 (×2): 10 mg via ORAL
  Filled 2015-02-06 (×3): qty 1

## 2015-02-06 NOTE — Progress Notes (Signed)
BPs still continue to be elevated, although came down to 142/100 earlier in the day.  BP at hs was 163/120.  Gave his scheduled Metoprolol 100mg , and upon re-check BP 161/110.  Paged on-call Hospitalist. Will continue to monitor.  Idelle Crouch. Konrad Dolores, RN

## 2015-02-06 NOTE — Progress Notes (Addendum)
Patient Name: Javier Berry Date of Encounter: 02/06/2015   Principal Problem:   Hypertensive urgency Active Problems:   Atrial fibrillation with RVR   Chronic combined systolic and diastolic CHF (congestive heart failure)   HTN (hypertension)   CKD (chronic kidney disease) stage 3, GFR 30-59 ml/min    SUBJECTIVE  No c/p. Some dyspnea overnight but better this AM.  BP remains elevated.  HR well-controlled.  For cardiolite this AM.  CURRENT MEDS . apixaban  5 mg Oral BID  . famotidine  20 mg Oral BID  . feeding supplement (ENSURE ENLIVE)  237 mL Oral BID BM  . furosemide  40 mg Oral Daily  . losartan  100 mg Oral Daily  . metoprolol tartrate  100 mg Oral BID  . regadenoson      . regadenoson  0.4 mg Intravenous Once  . spironolactone  25 mg Oral BID    OBJECTIVE  Filed Vitals:   02/05/15 2203 02/06/15 0104 02/06/15 0450 02/06/15 0937  BP: 163/120 161/110 153/99 152/109  Pulse: 72  85 95  Temp: 97.9 F (36.6 C)  98.2 F (36.8 C)   TempSrc: Oral  Oral   Resp: 18  18   Height:      Weight:      SpO2: 94%  98%     Intake/Output Summary (Last 24 hours) at 02/06/15 0952 Last data filed at 02/06/15 0845  Gross per 24 hour  Intake    120 ml  Output      2 ml  Net    118 ml   Filed Weights   02/03/15 1832 02/03/15 2050  Weight: 220 lb (99.791 kg) 221 lb 14.4 oz (100.653 kg)    PHYSICAL EXAM  General: Pleasant, NAD. Neuro: Alert and oriented X 3. Moves all extremities spontaneously. Psych: Normal affect. HEENT:  Normal  Neck: Supple without bruits or JVD. Lungs:  Resp regular and unlabored, CTA. Heart: IR, IR no s3, s4, or murmurs. Abdomen: Soft, non-tender, non-distended, BS + x 4.  Extremities: No clubbing, cyanosis or edema. DP/PT/Radials 2+ and equal bilaterally.  Accessory Clinical Findings  CBC  Recent Labs  02/03/15 1717 02/04/15 0230  WBC 6.0 6.1  NEUTROABS 3.6  --   HGB 14.8 13.2  HCT 42.4 38.5*  MCV 90.8 91.2  PLT 160 138*   Basic  Metabolic Panel  Recent Labs  02/03/15 1717 02/04/15 0230  NA 141 142  K 3.8 3.6  CL 107 108  CO2 24 27  GLUCOSE 102* 97  BUN 13 15  CREATININE 1.52* 1.55*  CALCIUM 8.7* 8.7*  MG 1.8  --    Liver Function Tests  Recent Labs  02/03/15 1717  AST 37  ALT 38  ALKPHOS 58  BILITOT 1.8*  PROT 6.4*  ALBUMIN 3.7   Cardiac Enzymes  Recent Labs  02/03/15 2017 02/04/15 0230 02/04/15 0800  TROPONINI <0.03 <0.03 <0.03   TELE  Seen in nuc med - afib.  Radiology/Studies  Dg Chest 2 View  02/03/2015   CLINICAL DATA:  Cough and congestion  EXAM: CHEST  2 VIEW  COMPARISON:  10/17/2013  FINDINGS: Cardiac shadow remains enlarged. Patchy changes are noted in the right upper lobe and left perihilar region of the lower lobe. No sizable effusion is seen. No bony abnormality is noted.  IMPRESSION: Patchy early infiltrates in the right upper and left lower lobes.   Electronically Signed   By: Alcide Clever M.D.   On: 02/03/2015 18:26  2D Echocardiogram   Study Conclusions  - Left ventricle: The cavity size was normal. Wall thickness was increased in a pattern of mild LVH. Systolic function was moderately reduced. The estimated ejection fraction was in the range of 35% to 40%. Diffuse hypokinesis. The study is not technically sufficient to allow evaluation of LV diastolic function. - Mitral valve: There was mild regurgitation. - Left atrium: The atrium was severely dilated. - Right ventricle: Systolic function was mildly to moderately reduced. - Right atrium: The atrium was moderately to severely dilated. Central venous pressure (est): 8 mm Hg. - Tricuspid valve: There was mild regurgitation. - Pulmonary arteries: PA peak pressure: 42 mm Hg (S). - Pericardium, extracardiac: There was no pericardial effusion. _____________   ASSESSMENT AND PLAN  1.  Hypertensive Urgency:  BP now trending 150's-160's over 90's.  Cont bb/arb.  In setting of LV dysfxn/Af Am, add  hydralazine/nitrates.  2.  Afib RVR:  In setting of noncompliance, #1, and acute chf.  Rate now well-controlled on lopressor 100 bid.  Consider consolidation to toprol xl given LV dysfxn.  Cont eliquis.  CHA2DS2VASc = 2.  3.  Acute on chronic combined systolic/diastolic CHF:  EF 35-40%.  Euvolemic on exam.  HR stable.  BP trending up. Cont bb/arb.  Add low-dose hydralazine/nitrate.  Cardiolite today.  4.  CKD III:  Creat relatively stable on current dose of PO lasix and arb.  Signed, Nicolasa Ducking NP    Attending Note:   The patient was seen and examined.  Agree with assessment and plan as noted above.  Changes made to the above note as needed.  Pt was examined in nuclear medicine. No further episodes of CP.  For myoview today .  Continue current meds including hydralazine and Imdur .  His BP is better .  He may be able to go home if the Myoview is back  Follow up with Dr. Jens Som / or PA in several weeks.  He should also follow up with his medical doctor      Vesta Mixer, Montez Hageman., MD, [facility] 02/06/2015, 10:54 AM [address],  [address] Office [phone] Pager [phone]

## 2015-02-06 NOTE — Discharge Summary (Addendum)
Physician Discharge Summary  [name]:[id number] DOB: 09/24/1961 DOA: 02/03/2015  PCP: Cathlean Cower, MD  Admit date: 02/03/2015 Discharge date: 02/06/2015  Time spent: 50 minutes  Recommendations for Outpatient Follow-up:  1. bmet in 1 wk  to ensure that potassium is stable as Lasix and Aldactone have been initiated on this admission 2. Follow-up BP and adjust antihypertensives as needed  Discharge Condition: stable Diet recommendation: low sodium heart healthy diet  Discharge Diagnoses:  Principal Problem:   Hypertensive urgency Active Problems:   HTN (hypertension)   Atrial fibrillation with RVR   CKD (chronic kidney disease) stage 3, GFR 30-59 ml/min   Chronic combined systolic and diastolic CHF (congestive heart failure)   History of present illness:  Javier Berry is a 54 y.o. male with past medical history of atrial fibrillation status post failed cardioversion in March 2015- his last cardiology follow-up was in April 2015 when he was recommended to increase his metoprolol. The patient thought that he was asked to discontinue his metoprolol and has not taking any metoprolol since. He presented to the hospital with shortness of breath for at least one week and pedal edema and was found to have A. fib with RVR, blood pressure of 204/149 and mild infiltrates on chest x-ray.   Hospital Course:  Principal Problem:  Hypertensive urgency -Metoprolol resumed on admission at a dose of 100 twice a day -He has been taking losartan/HCTZ at home - BP still elevated with a high diastolic component- -I spoke with Dr.Nahser today who recommended starting Lasix and Aldactone- he also started hydralazine and Imdur -I have discontinued HCTZ and will continue losartan   Active Problems:   Atrial fibrillation with RVR - Continue Lopressor at 100 mg BID - HR well controlled in the 70-80 range today -Continue Eliquis - 2-D echo ordered - see below   Systolic and Diastolic  CHF- chronic - EF 35- 40% - diffuse hypokinesis, RV systolic function mild to moderately reduced, Grade 2 diastolic dysfunction - Patchy early infiltrates in right upper and left lower lobes possibly second to pulmonary edema- no symptoms of cough fever and no leukocytosis to suggest that he has an underlying infection -Pulse ox is 98% on room air and exam is not suggestive of pulmonary edema - already on an ARB- as mentioned above, Dr. Acie Fredrickson recommending Lasix 40 mg daily, Aldactone 25 mg daily, hydralazine 10 mg 3 times a day and Imdur 15 mg daily in addition to Toprol 100 mg daily -B met to be obtained in 1 week to ensure that potassium is normal -Myoview stress test performed today- the study is normal- no ischemia noted  - EF 30-44%, LV mildly enlarged, global hypokinesis.    CKD 3 - Creatinine is at baseline   Procedures:  2-D echo  Myoview stress test  Consultations:  cardiology  Discharge Exam: Filed Weights   02/03/15 1832 02/03/15 2050  Weight: 99.791 kg (220 lb) 100.653 kg (221 lb 14.4 oz)   Filed Vitals:   02/06/15 1402  BP: 143/91  Pulse: 80  Temp: 98.5 F (36.9 C)  Resp: 18    General: AAO x 3, no distress Cardiovascular: RRR, no murmurs  Respiratory: clear to auscultation bilaterally GI: soft, non-tender, non-distended, bowel sound positive  Discharge Instructions You were cared for by a hospitalist during your hospital stay. If you have any questions about your discharge medications or the care you received while you were in the hospital after you are discharged, you can call the  unit and asked to speak with the hospitalist on call if the hospitalist that took care of you is not available. Once you are discharged, your primary care physician will handle any further medical issues. Please note that NO REFILLS for any discharge medications will be authorized once you are discharged, as it is imperative that you return to your primary care physician (or  establish a relationship with a primary care physician if you do not have one) for your aftercare needs so that they can reassess your need for medications and monitor your lab values.      Discharge Instructions    (HEART FAILURE PATIENTS) Call MD:  Anytime you have any of the following symptoms: 1) 3 pound weight gain in 24 hours or 5 pounds in 1 week 2) shortness of breath, with or without a dry hacking cough 3) swelling in the hands, feet or stomach 4) if you have to sleep on extra pillows at night in order to breathe.    Complete by:  As directed      Diet - low sodium heart healthy    Complete by:  As directed      Increase activity slowly    Complete by:  As directed             Medication List    STOP taking these medications        losartan-hydrochlorothiazide 100-12.5 MG per tablet  Commonly known as:  HYZAAR     metoprolol 50 MG tablet  Commonly known as:  LOPRESSOR      TAKE these medications        allopurinol 100 MG tablet  Commonly known as:  ZYLOPRIM  Take 100 mg by mouth daily as needed (gout attacks).     ELIQUIS 5 MG Tabs tablet  Generic drug:  apixaban  Take 1 tablet by mouth twice daily, will need OV for further refills     furosemide 40 MG tablet  Commonly known as:  LASIX  Take 1 tablet (40 mg total) by mouth daily.     hydrALAZINE 10 MG tablet  Commonly known as:  APRESOLINE  Take 1 tablet (10 mg total) by mouth 3 (three) times daily.     HYDROcodone-acetaminophen 7.5-325 MG per tablet  Commonly known as:  NORCO  Take 1 tablet by mouth every 4 (four) hours as needed.     isosorbide mononitrate 30 MG 24 hr tablet  Commonly known as:  IMDUR  Take 0.5 tablets (15 mg total) by mouth daily.     losartan 100 MG tablet  Commonly known as:  COZAAR  Take 1 tablet (100 mg total) by mouth daily.     metoprolol succinate 100 MG 24 hr tablet  Commonly known as:  TOPROL-XL  Take 1 tablet (100 mg total) by mouth daily. Take with or immediately  following a meal.     ranitidine 150 MG capsule  Commonly known as:  ZANTAC  Take once or twice a day as needed for your stomach     spironolactone 25 MG tablet  Commonly known as:  ALDACTONE  Take 1 tablet (25 mg total) by mouth daily.  Start taking on:  02/07/2015       No Known Allergies Follow-up Information    Follow up with [facility], MD In 1 week.   Specialty:  General Practice   Why:  need f/u appt with Dr Amadeo Garnet. Draw BMET at time of appt. Attempt made to call office ,no answer.  Contact information:   [place] [address] [phone]       Follow up with Kirk Ruths, MD. Go on 02/21/2015.   Specialty:  Cardiology   Why:  11:30 am on May 25 with  [place] office with [place] information:   [address] [place] [address] [phone]        The results of significant diagnostics from this hospitalization (including imaging, microbiology, ancillary and laboratory) are listed below for reference.    Significant Diagnostic Studies: Dg Chest 2 View  02/03/2015   CLINICAL DATA:  Cough and congestion  EXAM: CHEST  2 VIEW  COMPARISON:  10/17/2013  FINDINGS: Cardiac shadow remains enlarged. Patchy changes are noted in the right upper lobe and left perihilar region of the lower lobe. No sizable effusion is seen. No bony abnormality is noted.  IMPRESSION: Patchy early infiltrates in the right upper and left lower lobes.   Electronically Signed   By: Inez Catalina M.D.   On: 02/03/2015 18:26    Microbiology: No results found for this or any previous visit (from the past 240 hour(s)).   Labs: Basic Metabolic Panel:  Recent Labs Lab 02/03/15 1717 02/04/15 0230  NA 141 142  K 3.8 3.6  CL 107 108  CO2 24 27  GLUCOSE 102* 97  BUN 13 15  CREATININE 1.52* 1.55*  CALCIUM 8.7* 8.7*  MG 1.8  --    Liver Function Tests:  Recent Labs Lab 02/03/15 1717  AST 37  ALT 38  ALKPHOS 58   BILITOT 1.8*  PROT 6.4*  ALBUMIN 3.7   No results for input(s): LIPASE, AMYLASE in the last 168 hours. No results for input(s): AMMONIA in the last 168 hours. CBC:  Recent Labs Lab 02/03/15 1717 02/04/15 0230  WBC 6.0 6.1  NEUTROABS 3.6  --   HGB 14.8 13.2  HCT 42.4 38.5*  MCV 90.8 91.2  PLT 160 138*   Cardiac Enzymes:  Recent Labs Lab 02/03/15 2017 02/04/15 0230 02/04/15 0800  TROPONINI <0.03 <0.03 <0.03   BNP: BNP (last 3 results)  Recent Labs  02/03/15 1717  BNP 257.9*    ProBNP (last 3 results) No results for input(s): PROBNP in the last 8760 hours.  CBG: No results for input(s): GLUCAP in the last 168 hours.     SignedDebbe Odea, MD [facility] 02/06/2015, 4:33 PM

## 2015-02-13 ENCOUNTER — Other Ambulatory Visit (INDEPENDENT_AMBULATORY_CARE_PROVIDER_SITE_OTHER): Payer: BC Managed Care – PPO

## 2015-02-13 DIAGNOSIS — N183 Chronic kidney disease, stage 3 unspecified: Secondary | ICD-10-CM

## 2015-02-13 LAB — BASIC METABOLIC PANEL
BUN: 26 mg/dL — ABNORMAL HIGH (ref 6–23)
CALCIUM: 9.7 mg/dL (ref 8.4–10.5)
CO2: 30 mEq/L (ref 19–32)
CREATININE: 1.35 mg/dL (ref 0.40–1.50)
Chloride: 105 mEq/L (ref 96–112)
GFR: 70.45 mL/min (ref >60.00)
Glucose, Bld: 98 mg/dL (ref 70–99)
Potassium: 4.1 mEq/L (ref 3.5–5.1)
Sodium: 140 mEq/L (ref 135–145)

## 2015-02-21 ENCOUNTER — Ambulatory Visit (INDEPENDENT_AMBULATORY_CARE_PROVIDER_SITE_OTHER): Payer: BC Managed Care – PPO | Admitting: Nurse Practitioner

## 2015-02-21 ENCOUNTER — Encounter: Payer: Self-pay | Admitting: Nurse Practitioner

## 2015-02-21 ENCOUNTER — Telehealth: Payer: Self-pay | Admitting: Nurse Practitioner

## 2015-02-21 VITALS — BP 160/100 | HR 78 | Ht 72.0 in | Wt 216.8 lb

## 2015-02-21 DIAGNOSIS — I4891 Unspecified atrial fibrillation: Secondary | ICD-10-CM | POA: Diagnosis not present

## 2015-02-21 DIAGNOSIS — I5041 Acute combined systolic (congestive) and diastolic (congestive) heart failure: Secondary | ICD-10-CM | POA: Diagnosis not present

## 2015-02-21 DIAGNOSIS — I10 Essential (primary) hypertension: Secondary | ICD-10-CM

## 2015-02-21 DIAGNOSIS — Z7901 Long term (current) use of anticoagulants: Secondary | ICD-10-CM | POA: Diagnosis not present

## 2015-02-21 LAB — BASIC METABOLIC PANEL
BUN: 27 mg/dL — ABNORMAL HIGH (ref 6–23)
CO2: 28 mEq/L (ref 19–32)
Calcium: 9.7 mg/dL (ref 8.4–10.5)
Chloride: 105 mEq/L (ref 96–112)
Creatinine, Ser: 1.55 mg/dL — ABNORMAL HIGH (ref 0.40–1.50)
GFR: 60.07 mL/min (ref >60.00)
Glucose, Bld: 87 mg/dL (ref 70–99)
Potassium: 3.7 mEq/L (ref 3.5–5.1)
Sodium: 141 mEq/L (ref 135–145)

## 2015-02-21 MED ORDER — SPIRONOLACTONE 25 MG PO TABS: 25.0000 mg | ORAL_TABLET | Freq: Every day | ORAL | Status: DC

## 2015-02-21 MED ORDER — ELIQUIS 5 MG PO TABS: ORAL_TABLET | ORAL | Status: DC

## 2015-02-21 MED ORDER — FUROSEMIDE 40 MG PO TABS: 40.0000 mg | ORAL_TABLET | Freq: Every day | ORAL | Status: DC

## 2015-02-21 MED ORDER — ISOSORBIDE MONONITRATE ER 30 MG PO TB24
15.0000 mg | ORAL_TABLET | Freq: Every day | ORAL | Status: DC
Start: 2015-02-21 — End: 2015-03-22

## 2015-02-21 MED ORDER — METOPROLOL SUCCINATE ER 100 MG PO TB24: 100.0000 mg | ORAL_TABLET | Freq: Every day | ORAL | Status: DC

## 2015-02-21 MED ORDER — HYDRALAZINE HCL 25 MG PO TABS: 25.0000 mg | ORAL_TABLET | Freq: Three times a day (TID) | ORAL | Status: DC

## 2015-02-21 NOTE — Patient Instructions (Addendum)
We will be checking the following labs today - BMET   Medication Instructions:    Continue with your current medicines but I am   Stopping the LosartanHCT  I have stopped the 10 mg of Hydralazine three times a day and replaced it with the 25 mg tablet to take 3 times a day  Go by this list of medicines.      Testing/Procedures To Be Arranged:  N/A  Follow-Up:   See Dr. Jens Som in a month     Other Special Instructions:   You cannot have any Soy Sauce!!! Way too much salt and will hurt you.   Call the [facility] office at [phone] if you have any questions, problems or concerns.

## 2015-02-21 NOTE — Telephone Encounter (Signed)
Pt dropped off [employer] form while here in office, Sunday Spillers completed this form for patient.  It was faxed to [employer] at [phone] and patient was called for pick up.

## 2015-02-21 NOTE — Progress Notes (Signed)
CARDIOLOGY OFFICE NOTE  Date:  02/21/2015    Javier Berry Date of Birth: 06/26/1961 Medical Record #[id number]  PCP:  Erlinda Hong, MD  Cardiologist:  Jens Som  Chief Complaint  Patient presents with  . Atrial Fibrillation    Post hospital visit - seen for Dr. Jens Som    History of Present Illness: Javier Berry is a 54 y.o. male who presents today for a post hospital visit. He is seen for Dr. Jens Som. He has a past medical history of atrial fibrillation status post failed cardioversion in March 2015.   He was last seen by Dr. Jens Som in April 2015 when he was recommended to increase his metoprolol. The patient thought that he was asked to discontinue his metoprolol and has not taking any metoprolol since.   He presented to the hospital earlier this month with shortness of breath for at least one week and pedal edema and was found to have A. fib with RVR, blood pressure of 204/149 and mild infiltrates on chest x-ray. He had probably had too much salt as well. Placed on Lasix. Had his HCTZ stopped from his ARV. Aldactone was added and metoprolol was increased. Hydralazine and Imdur was added as well.   Comes back today. Here alone. He went back to work about 2 days after discharge. He is feeling better. Trying to restrict his salt but a family member cooks every thing with soy sauce. No chest pain. Breathing all better. No swelling. Weight is ok. He is taking both Losartan HCT and plain Losartan. Says his BP is better at home. Not dizzy or lightheaded. Needs meds refilled.   Past Medical History  Diagnosis Date  . Gout   . Essential hypertension   . PUD (peptic ulcer disease)   . Atrial fibrillation     Past Surgical History  Procedure Laterality Date  . Toe surgery    . Cardioversion N/A 12/20/2013    Procedure: CARDIOVERSION;  Surgeon: Lewayne Bunting, MD;  Location: [facility] ENDOSCOPY;  Service: Cardiovascular;  Laterality: N/A;     Medications: Current  Outpatient Prescriptions  Medication Sig Dispense Refill  . allopurinol (ZYLOPRIM) 100 MG tablet Take 100 mg by mouth daily as needed (gout attacks).     [name] ELIQUIS 5 MG TABS tablet Take 1 tablet by mouth twice daily 60 tablet 6  . ENSURE (ENSURE) Take 237 mLs by mouth 2 (two) times daily between meals.    . furosemide (LASIX) 40 MG tablet Take 1 tablet (40 mg total) by mouth daily. 30 tablet 6  . HYDROcodone-acetaminophen (NORCO) 7.5-325 MG per tablet Take 1 tablet by mouth every 4 (four) hours as needed. (Patient taking differently: Take 1 tablet by mouth every 4 (four) hours as needed (pain). ) 20 tablet 0  . isosorbide mononitrate (IMDUR) 30 MG 24 hr tablet Take 0.5 tablets (15 mg total) by mouth daily. 30 tablet 6  . losartan (COZAAR) 100 MG tablet Take 1 tablet (100 mg total) by mouth daily. 30 tablet 0  . metoprolol succinate (TOPROL-XL) 100 MG 24 hr tablet Take 1 tablet (100 mg total) by mouth daily. Take with or immediately following a meal. 30 tablet 6  . ranitidine (ZANTAC) 150 MG capsule Take once or twice a day as needed for your stomach (Patient taking differently: Take 150 mg by mouth daily as needed (stomach pain/ulcers). Take once or twice a day as needed for your stomach) 60 capsule 3  . spironolactone (ALDACTONE) 25 MG tablet Take  1 tablet (25 mg total) by mouth daily. 30 tablet 6  . hydrALAZINE (APRESOLINE) 25 MG tablet Take 1 tablet (25 mg total) by mouth 3 (three) times daily. 90 tablet 6   No current facility-administered medications for this visit.    Allergies: No Known Allergies  Social History: The patient  reports that he has never smoked. He does not have any smokeless tobacco history on file. He reports that he drinks alcohol. He reports that he does not use illicit drugs.   Family History: The patient's family history includes Diabetes in his mother; Drug abuse in his brother; Heart disease in his brother and father; Hypertension in his father; Kidney disease in  his mother and sister; Stroke in his sister.   Review of Systems: Please see the history of present illness.   Otherwise, the review of systems is positive for none.   All other systems are reviewed and negative.   Physical Exam: VS:  BP 160/100 mmHg  Pulse 78  Ht 6' (1.829 m)  Wt 216 lb 12.8 oz (98.34 kg)  BMI 29.40 kg/m2 .  BMI Body mass index is 29.4 kg/(m^2).  Wt Readings from Last 3 Encounters:  02/21/15 216 lb 12.8 oz (98.34 kg)  02/03/15 221 lb 14.4 oz (100.653 kg)  01/16/14 225 lb 12.8 oz (102.422 kg)   Repeat BP by me is 140/100  General: Pleasant. Well developed, well nourished and in no acute distress. His weight is down 9 pounds over the past year.  HEENT: Normal. Neck: Supple, no JVD, carotid bruits, or masses noted.  Cardiac: Irregular irregular rhythm. His rate is controlled. No murmurs, rubs, or gallops. No edema.  Respiratory:  Lungs are clear to auscultation bilaterally with normal work of breathing.  GI: Soft and nontender.  MS: No deformity or atrophy. Gait and ROM intact. Skin: Warm and dry. Color is normal.  Neuro:  Strength and sensation are intact and no gross focal deficits noted.  Psych: Alert, appropriate and with normal affect.   LABORATORY DATA:  EKG:  EKG is ordered today. This demonstrates AF with a controlled VR.  Lab Results  Component Value Date   WBC 6.1 02/04/2015   HGB 13.2 02/04/2015   HCT 38.5* 02/04/2015   PLT 138* 02/04/2015   GLUCOSE 98 02/13/2015   ALT 38 02/03/2015   AST 37 02/03/2015   NA 140 02/13/2015   K 4.1 02/13/2015   CL 105 02/13/2015   CREATININE 1.35 02/13/2015   BUN 26* 02/13/2015   CO2 30 02/13/2015   TSH 1.55 10/28/2013    BNP (last 3 results)  Recent Labs  02/03/15 1717  BNP 257.9*    ProBNP (last 3 results) No results for input(s): PROBNP in the last 8760 hours.   Other Studies Reviewed Today:  Echo Study Conclusions from 01/2015  - Left ventricle: The cavity size was normal. Wall thickness  was increased in a pattern of mild LVH. Systolic function was moderately reduced. The estimated ejection fraction was in the range of 35% to 40%. Diffuse hypokinesis. The study is not technically sufficient to allow evaluation of LV diastolic function. - Mitral valve: There was mild regurgitation. - Left atrium: The atrium was severely dilated. - Right ventricle: Systolic function was mildly to moderately reduced. - Right atrium: The atrium was moderately to severely dilated. Central venous pressure (est): 8 mm Hg. - Tricuspid valve: There was mild regurgitation. - Pulmonary arteries: PA peak pressure: 42 mm Hg (S). - Pericardium, extracardiac: There was  no pericardial effusion.  Impressions:  - Mild LVH with LVEF 35-40%, diffuse hypokinesis with some septal dyssynergy. There has been reduction in LVEF compared to prior study February 2015. Indeterminate diastolic function - suspect at least grade 2. Severe left atrial enlargement. Mildly to moderately reduced RV contraction. Mild tricuspid regurgitation with PASP 42 mmHg.  Myoview Study Impression The study is normal. This is a high risk study. LV cavity size is mildly enlarged. The left ventricular ejection fraction is moderately decreased (30-44%). There is no prior study for comparison.Global Hypokinesis  Assessment/Plan: 1. HTN - taking 200 mg of Losartan - I have thrown away his bottle of Losartan HCT - increase his Hydralazine to 25 mg TID. See back in 4 weeks.   2. AF - had RVR - now his rate is more controlled. Remains on Eliquis - I have left him on his current regimen.   3. Chronic systolic and diastolic CHF - weight is down. Looks better. He looks better clinically.   4. CKD 3 - needs labs today - especially with taking 2 ARBs.   Current medicines are reviewed with the patient today.  The patient does not have concerns regarding medicines other than what has been noted above.  The following  changes have been made:  See above.  Labs/ tests ordered today include:    Orders Placed This Encounter  Procedures  . Basic metabolic panel  . EKG 12-Lead     Disposition:   FU with Dr. Jens Som in 4 weeks.      Patient is agreeable to this plan and will call if any problems develop in the interim.   Signed: Rosalio Macadamia, RN, ANP-C 02/21/2015 12:07 PM  [facility] [address] [place], [place]  [address] Phone: [phone] Fax: [phone]

## 2015-02-23 ENCOUNTER — Telehealth: Payer: Self-pay | Admitting: Nurse Practitioner

## 2015-02-23 NOTE — Telephone Encounter (Signed)
Patient picked up [employer] [place] paperwork.

## 2015-03-09 ENCOUNTER — Other Ambulatory Visit: Payer: Self-pay | Admitting: Internal Medicine

## 2015-03-21 ENCOUNTER — Telehealth: Payer: Self-pay | Admitting: Cardiology

## 2015-03-21 NOTE — Telephone Encounter (Signed)
Pt c/o Shortness Of Breath: STAT if SOB developed within the last 24 hours or pt is noticeably SOB on the phone  1. Are you currently SOB (can you hear that pt is SOB on the phone)? No  2. How long have you been experiencing SOB? 3-4 days  3. Are you SOB when sitting or when up moving around? Moving around  4. Are you currently experiencing any other symptoms? Fatigue

## 2015-03-21 NOTE — Telephone Encounter (Signed)
Returned call to patient's wife.She stated husband has been sob,more tired in the last 4 to 5 days.Stated she would like appointment.Advised Dr.Crenshaw out of office this week.Appointment scheduled with DOD Dr.Harding 03/22/15 at 2:45 pm.

## 2015-03-22 ENCOUNTER — Ambulatory Visit (INDEPENDENT_AMBULATORY_CARE_PROVIDER_SITE_OTHER): Payer: BC Managed Care – PPO | Admitting: Cardiology

## 2015-03-22 ENCOUNTER — Encounter: Payer: Self-pay | Admitting: Cardiology

## 2015-03-22 ENCOUNTER — Ambulatory Visit: Payer: BC Managed Care – PPO | Admitting: Cardiology

## 2015-03-22 VITALS — BP 116/82 | HR 114 | Ht 70.0 in | Wt 220.0 lb

## 2015-03-22 DIAGNOSIS — I5042 Chronic combined systolic (congestive) and diastolic (congestive) heart failure: Secondary | ICD-10-CM | POA: Diagnosis not present

## 2015-03-22 DIAGNOSIS — I10 Essential (primary) hypertension: Secondary | ICD-10-CM

## 2015-03-22 DIAGNOSIS — I420 Dilated cardiomyopathy: Secondary | ICD-10-CM

## 2015-03-22 DIAGNOSIS — I4891 Unspecified atrial fibrillation: Secondary | ICD-10-CM

## 2015-03-22 DIAGNOSIS — I5043 Acute on chronic combined systolic (congestive) and diastolic (congestive) heart failure: Secondary | ICD-10-CM | POA: Diagnosis not present

## 2015-03-22 LAB — BASIC METABOLIC PANEL
BUN: 25 mg/dL — ABNORMAL HIGH (ref 6–23)
CALCIUM: 9.2 mg/dL (ref 8.4–10.5)
CO2: 27 mEq/L (ref 19–32)
CREATININE: 1.6 mg/dL — AB (ref 0.50–1.35)
Chloride: 107 mEq/L (ref 96–112)
Glucose, Bld: 96 mg/dL (ref 70–99)
Potassium: 4.2 mEq/L (ref 3.5–5.3)
Sodium: 141 mEq/L (ref 135–145)

## 2015-03-22 MED ORDER — METOPROLOL SUCCINATE ER 100 MG PO TB24: 100.0000 mg | ORAL_TABLET | Freq: Every day | ORAL | Status: DC

## 2015-03-22 MED ORDER — SPIRONOLACTONE 25 MG PO TABS: 25.0000 mg | ORAL_TABLET | Freq: Every day | ORAL | Status: DC

## 2015-03-22 MED ORDER — ISOSORBIDE MONONITRATE ER 30 MG PO TB24: 30.0000 mg | ORAL_TABLET | Freq: Every day | ORAL | Status: DC

## 2015-03-22 NOTE — Assessment & Plan Note (Addendum)
Restart Toprol - his current symptoms are probably related to A. Fib RVR. Add Aldactone 12.5 mg Increased Imdur to 30 mg Extra dose of Lasix tomorrow.  Check BNP, BMP   Close f/u with Dr. Jens Som or Norma Fredrickson, NP next week.

## 2015-03-22 NOTE — Patient Instructions (Signed)
INCREASE ISOSORBIDE MN TO 30 MG DAILY  TAKE METOPROLOL ER (TOPROL ER) TONIGHT  TAKE EXTRA DOSE OF FUROSEMIDE FOR THE NEXT 2 DAYS.  KEEPING TAKING THE ZANTAC.  DO LABS TODAY OR TOMORROW--BMP,BNP   Your physician recommends that you schedule a follow-up appointment WITH DR Jens Som OR LORI GERHARDT NEXT WEEK.

## 2015-03-22 NOTE — Progress Notes (Signed)
PCP: [facility], MD  Clinic Note: Chief Complaint  Patient presents with  . Follow-up    Patient has had SOB.    HPI: Javier Berry is a 54 y.o. male with a PMH below (HTN, Afib) who presents today for profound SOB @ night -- mostly exertional. Most recently seen 02/21/2015 by Javier Fredrickson, NP:   Noted issues in the past reference his taking metoprolol.  Discussed recent hospitalization for dyspnea and pedal edema. Found to be in A. Fib RVR with severe systemic hypertension 2 [birth year]/1947. Admitted to dietary indiscretion. --> When on DC was increased metoprolol, hydralazine plus Imdur and spironolactone in place of HCTZ. Also standing Lasix.  At followup visit was feeling better. Noted that he was restricting salt. weightswere stable. Was told to simply take losartan as opposed to losartan HCTZ  Past Medical History  Diagnosis Date  . Gout   . Essential hypertension   . PUD (peptic ulcer disease)   . Atrial fibrillation     Prior Cardiac Evaluation and Past Surgical History: Past Surgical History  Procedure Laterality Date  . Toe surgery    . Cardioversion N/A 12/20/2013    Procedure: CARDIOVERSION;  Surgeon: Lewayne Bunting, MD;  Location: [facility] ENDOSCOPY;  Service: Cardiovascular;  Laterality: N/A;   Echo  10/2013: moderate LVH, EF 60-65%. No regional wall motion abnormalities. Mild to moderately dilated left atrium and right atrium.  Echo 01/2015: Impression:  Mild LVH with LVEF 35-40%, diffuse hypokinesis with some septaldyssynergy. There has been reduction in LVEF compared to prior study February 2015. Indeterminate diastolic function - suspect at least grade 2. Severe left & Mod-Severe Right atrial enlargement. Mildly to moderately reduced RV contraction. Mild tricuspid regurgitation with PASP 42 mmHg.  Myoview 01/2015: Unclear reading -- HIGH RISK, Normal Study --> enlarged LV cavity, Global HK - EF 30-44%  Interval History: Javier Berry presents here today for a work  in visit.he is noted progressively worsening shortness of breath and irregular heartbeats.  He notices chest discomfort more so than chest pain. He thinks it is from his heart rate gets up fast he feels uncomfortable. Does not sound to anginal symptoms. Has noted increased HR over last few nights, somewhat irregular. Had run out of Metoprolol (~4 days) --> unable to get refill by PCP [facility] few days, also noted Sx during the day.  More recently, during day-time does pretty well, but @ night just simply walking with a small glass bottle or bag - gets very SOB.  Had to sit up to get good breath in & out.  Feels like a tightness in chest.  Has also felt a bit constipated --> Last PM, took Zantac - some relief.  Also increased fluid intake.  Feels somewhat better after BM.  No syncope/near syncope, TIA/amaurosis fugax.  Just notes feeling tired & need to move slow.  Today  He is ~50% better, but not @ baseline.  Despite having PND orthopnea, he really hasn't had much in way of edema. Had gained weight - backed down on diet supplement - & has lost some.  ROS: A comprehensive was performed. Review of Systems  Respiratory: Positive for shortness of breath. Negative for cough and wheezing.   Cardiovascular: Negative for claudication.  Gastrointestinal: Positive for abdominal pain and constipation. Negative for blood in stool and melena.  Genitourinary: Negative for hematuria.  Neurological: Negative for headaches.  All other systems reviewed and are negative.   Current Outpatient Prescriptions on File Prior to Visit  Medication Sig Dispense Refill  . allopurinol (ZYLOPRIM) 100 MG tablet Take 100 mg by mouth daily as needed (gout attacks).     [name] ELIQUIS 5 MG TABS tablet Take 1 tablet by mouth twice daily 60 tablet 6  . ENSURE (ENSURE) Take 237 mLs by mouth 2 (two) times daily between meals.    . furosemide (LASIX) 40 MG tablet Take 1 tablet (40 mg total) by mouth daily. 30 tablet 6  . hydrALAZINE  (APRESOLINE) 25 MG tablet Take 1 tablet (25 mg total) by mouth 3 (three) times daily. 90 tablet 6  . HYDROcodone-acetaminophen (NORCO) 7.5-325 MG per tablet Take 1 tablet by mouth every 4 (four) hours as needed. (Patient taking differently: Take 1 tablet by mouth every 4 (four) hours as needed (pain). ) 20 tablet 0  . losartan (COZAAR) 100 MG tablet Take 1 tablet (100 mg total) by mouth daily. 30 tablet 0  . ranitidine (ZANTAC) 150 MG capsule Take once or twice a day as needed for your stomach (Patient taking differently: Take 150 mg by mouth daily as needed (stomach pain/ulcers). Take once or twice a day as needed for your stomach) 60 capsule 3   No current facility-administered medications on file prior to visit.   No Known Allergies   History  Substance Use Topics  . Smoking status: Never Smoker   . Smokeless tobacco: Not on file  . Alcohol Use: Yes     Comment: Occasional   family history includes Diabetes in his mother; Drug abuse in his brother; Heart disease in his brother and father; Hypertension in his father; Kidney disease in his mother and sister; Stroke in his sister.   Wt Readings from Last 3 Encounters:  03/22/15 99.791 kg (220 lb)  02/21/15 98.34 kg (216 lb 12.8 oz)  02/03/15 100.653 kg (221 lb 14.4 oz)    PHYSICAL EXAM BP 116/82 mmHg  Pulse 114  Ht  (1.778 m)  Wt 99.791 kg (220 lb)  BMI 31.57 kg/m2 General appearance: alert, cooperative, appears stated age, no distress and mildly obese HEENT: [place]/AT, EOMI, MMM, anicteric sclera Neck: no adenopathy, no carotid bruit and mildly increasedJVD Lungs: clear to auscultation bilaterally - with the exception of mild basal rales, normal percussion bilaterally and non-labored Heart: irregularly irregular rhythm with rapid rate., S1&S2 normal, no murmur, click, rub or gallop; nondisplaced PMI. Abdomen: soft, non-tender; bowel sounds normal; no masses,  no organomegaly; mild HJR Extremities: extremities normal,  atraumatic, no cyanosis, or edema  Pulses: 2+ and symmetric;  Neurologic: Mental status: Alert, oriented, thought content appropriate Cranial nerves: normal (II-XII grossly intact)    Adult ECG Report -- not checked  Other studies Reviewed: Additional studies/ records that were reviewed today include:Echocardiogram from 02/04/2015, and Myoview from 02/06/2015 Review of the above records demonstrates:  Recent Labs:  5/25 & Day of visit.   Chemistry      Component Value Date/Time   NA 141 03/22/2015 1646   K 4.2 03/22/2015 1646   CL 107 03/22/2015 1646   CO2 27 03/22/2015 1646   BUN 25* 03/22/2015 1646   CREATININE 1.60* 03/22/2015 1646   CREATININE 1.55* 02/21/2015 1218      Component Value Date/Time   CALCIUM 9.2 03/22/2015 1646   ALKPHOS 58 02/03/2015 1717   AST 37 02/03/2015 1717   ALT 38 02/03/2015 1717   BILITOT 1.8* 02/03/2015 1717      ASSESSMENT / PLAN: Problem List Items Addressed This Visit    Acute on chronic  combined systolic and diastolic congestive heart failure, NYHA class 3    Restart Toprol - his current symptoms are probably related to A. Fib RVR. Add Aldactone 12.5 mg Increased Imdur to 30 mg Extra dose of Lasix tomorrow.  Check BNP, BMP   Close f/u with Dr. Jens Som or Javier Fredrickson, NP next week.      Relevant Medications   metoprolol succinate (TOPROL-XL) 100 MG 24 hr tablet   isosorbide mononitrate (IMDUR) 30 MG 24 hr tablet   spironolactone (ALDACTONE) 25 MG tablet   Other Relevant Orders   Basic metabolic panel (Completed)   B Nat Peptide (Completed)   Atrial fibrillation with RVR - Primary    I not sure what his issue is the metoprolol. I told him that the Toprol must be filled. If he has questions he must call our office to ensure that he gets this medication. He is probably in chronic atrial fibrillation, and is usually asymptomatic as long as heart rate is down. His symptoms got worse when around his beta blocker.  Remains  anticoagulated on Eliquis. No bleeding issues.      Relevant Medications   metoprolol succinate (TOPROL-XL) 100 MG 24 hr tablet   isosorbide mononitrate (IMDUR) 30 MG 24 hr tablet   spironolactone (ALDACTONE) 25 MG tablet   Other Relevant Orders   Basic metabolic panel (Completed)   B Nat Peptide (Completed)   Chronic combined systolic and diastolic CHF (congestive heart failure)   Relevant Medications   metoprolol succinate (TOPROL-XL) 100 MG 24 hr tablet   isosorbide mononitrate (IMDUR) 30 MG 24 hr tablet   spironolactone (ALDACTONE) 25 MG tablet   Other Relevant Orders   Basic metabolic panel (Completed)   B Nat Peptide (Completed)   Congestive dilated cardiomyopathy (Chronic)    He had an echocardiogram in February with a preserved EF but severely reduced EF while in A. Fib. Also of early unusual readout on his Myoview is read as "high risk" but NORMAL. Likely because of reduced EF of 30-44%.there is no suggestion of ischemia.  Interestingly his discharge summary indicated negative Myoview only.  Likely nonischemic, cannot exclude multivessel disease. Most likely related to A. Fib as a change from February to May. He is currently on hydralazine which can be titrated up if his blood pressure will tolerate, however is not high and now. He is on Imdur which we are increasing to full 30 mg. He is now restarted on his Toprol 1 mg daily. Also is supposedly taking Aldactone.      Relevant Medications   metoprolol succinate (TOPROL-XL) 100 MG 24 hr tablet   isosorbide mononitrate (IMDUR) 30 MG 24 hr tablet   spironolactone (ALDACTONE) 25 MG tablet   Essential hypertension (Chronic)    In April he was changed from combination of amlodipine, losartan and HCTZ to simply losartan/HCTZ. This was in the setting of increasing metoprolol. I think he got confused with this change in dose. He also  Did not seem clear as to refill this medication. He was contacting his primary physician's office,  but should simply be contacting our office to have this refilled.  Blood pressure was actually pretty good today. I don't have room to titrate up on the medications.      Relevant Medications   metoprolol succinate (TOPROL-XL) 100 MG 24 hr tablet   isosorbide mononitrate (IMDUR) 30 MG 24 hr tablet   spironolactone (ALDACTONE) 25 MG tablet      Current medicines are reviewed at length  with the patient today. (+/- concerns) He apparently did not get his Toprol refilled, despite having just discussed this with Lawson Fiscal. The following changes have been made:  INCREASE ISOSORBIDE MN TO 30 MG DAILY  TAKE METOPROLOL ER (TOPROL ER) TONIGHT  TAKE EXTRA DOSE OF FUROSEMIDE FOR THE NEXT 2 DAYS.  KEEPING TAKING THE ZANTAC.  DO LABS TODAY OR TOMORROW--BMP,BNP  labs/ tests ordered today include:   Orders Placed This Encounter  Procedures  . Basic metabolic panel  . B Nat Peptide   Meds ordered this encounter  Medications  . metoprolol succinate (TOPROL-XL) 100 MG 24 hr tablet    Sig: Take 1 tablet (100 mg total) by mouth daily. Take with or immediately following a meal.    Dispense:  90 tablet    Refill:  3  . isosorbide mononitrate (IMDUR) 30 MG 24 hr tablet    Sig: Take 1 tablet (30 mg total) by mouth daily.    Dispense:  90 tablet    Refill:  3  . spironolactone (ALDACTONE) 25 MG tablet    Sig: Take 1 tablet (25 mg total) by mouth daily.    Dispense:  90 tablet    Refill:  3    Your physician recommends that you schedule a follow-up appointment WITH DR Jens Som OR LORI GERHARDT NEXT WEEK.    Marykay Lex, M.D., M.S. Interventional Cardiologist   Pager # [phone]

## 2015-03-23 LAB — BRAIN NATRIURETIC PEPTIDE: BRAIN NATRIURETIC PEPTIDE: 378.1 pg/mL — AB (ref 0.0–100.0)

## 2015-03-24 DIAGNOSIS — I420 Dilated cardiomyopathy: Secondary | ICD-10-CM | POA: Insufficient documentation

## 2015-03-24 NOTE — Assessment & Plan Note (Signed)
He had an echocardiogram in February with a preserved EF but severely reduced EF while in A. Fib. Also of early unusual readout on his Myoview is read as "high risk" but NORMAL. Likely because of reduced EF of 30-44%.there is no suggestion of ischemia.  Interestingly his discharge summary indicated negative Myoview only.  Likely nonischemic, cannot exclude multivessel disease. Most likely related to A. Fib as a change from February to May. He is currently on hydralazine which can be titrated up if his blood pressure will tolerate, however is not high and now. He is on Imdur which we are increasing to full 30 mg. He is now restarted on his Toprol 1 mg daily. Also is supposedly taking Aldactone.

## 2015-03-24 NOTE — Assessment & Plan Note (Signed)
I not sure what his issue is the metoprolol. I told him that the Toprol must be filled. If he has questions he must call our office to ensure that he gets this medication. He is probably in chronic atrial fibrillation, and is usually asymptomatic as long as heart rate is down. His symptoms got worse when around his beta blocker.  Remains anticoagulated on Eliquis. No bleeding issues.

## 2015-03-24 NOTE — Assessment & Plan Note (Signed)
In April he was changed from combination of amlodipine, losartan and HCTZ to simply losartan/HCTZ. This was in the setting of increasing metoprolol. I think he got confused with this change in dose. He also  Did not seem clear as to refill this medication. He was contacting his primary physician's office, but should simply be contacting our office to have this refilled.  Blood pressure was actually pretty good today. I don't have room to titrate up on the medications.

## 2015-03-27 ENCOUNTER — Ambulatory Visit (INDEPENDENT_AMBULATORY_CARE_PROVIDER_SITE_OTHER): Payer: BC Managed Care – PPO | Admitting: Nurse Practitioner

## 2015-03-27 ENCOUNTER — Encounter: Payer: Self-pay | Admitting: Nurse Practitioner

## 2015-03-27 VITALS — BP 138/100 | HR 63 | Resp 18 | Ht 72.0 in | Wt 220.0 lb

## 2015-03-27 DIAGNOSIS — Z7901 Long term (current) use of anticoagulants: Secondary | ICD-10-CM

## 2015-03-27 DIAGNOSIS — I482 Chronic atrial fibrillation, unspecified: Secondary | ICD-10-CM

## 2015-03-27 LAB — BASIC METABOLIC PANEL
BUN: 21 mg/dL (ref 6–23)
CO2: 29 mEq/L (ref 19–32)
Calcium: 9.4 mg/dL (ref 8.4–10.5)
Chloride: 109 mEq/L (ref 96–112)
Creatinine, Ser: 1.54 mg/dL — ABNORMAL HIGH (ref 0.40–1.50)
GFR: 60.5 mL/min (ref >60.00)
Glucose, Bld: 101 mg/dL — ABNORMAL HIGH (ref 70–99)
Potassium: 4.1 mEq/L (ref 3.5–5.1)
Sodium: 144 mEq/L (ref 135–145)

## 2015-03-27 LAB — CBC
HCT: 46.5 % (ref 39.0–52.0)
Hemoglobin: 15.5 g/dL (ref 13.0–17.0)
MCHC: 33.4 g/dL (ref 30.0–36.0)
MCV: 95.5 fl (ref 78.0–100.0)
Platelets: 154 10*3/uL (ref 150.0–400.0)
RBC: 4.87 Mil/uL (ref 4.22–5.81)
RDW: 15 % (ref 11.5–15.5)
WBC: 6.5 10*3/uL (ref 4.0–10.5)

## 2015-03-27 MED ORDER — HYDRALAZINE HCL 50 MG PO TABS: 50.0000 mg | ORAL_TABLET | Freq: Three times a day (TID) | ORAL | Status: DC

## 2015-03-27 NOTE — Progress Notes (Signed)
CARDIOLOGY OFFICE NOTE  Date:  03/27/2015    Javier Berry Date of Birth: 01-27-1961 Medical Record #[id number]  PCP:  Erlinda Hong, MD  Cardiologist:  Jens Som    Chief Complaint  Patient presents with  . Atrial Fibrillation    1 week check - seen for Dr. Jens Som.   . Labs Only    History of Present Illness: Javier Berry is a 54 y.o. male who presents today for a one week check. He is seen for Dr. Jens Som. He has a past medical history of atrial fibrillation status post failed cardioversion in March 2015.   He was last seen by Dr. Jens Som in April 2015 when he was recommended to increase his metoprolol. The patient thought that he was asked to discontinue his metoprolol and so he stopped.   He presented to the hospital earlier in May with shortness of breath for at least one week and pedal edema and was found to have A. fib with RVR, blood pressure of 204/149 and mild infiltrates on chest x-ray. He had probably had too much salt as well. Placed on Lasix. Had his HCTZ stopped from his ARV. Aldactone was added and metoprolol was reintroduced/increased. Hydralazine and Imdur was added as well.   I saw him back after that admission - only 2 days after discharge. Lots of salt - family cooks with soy sauce. He was improved clinically.   Saw Dr. Herbie Baltimore last week - due to progressively worsening shortness of breath and irregular heartbeats. He had ran out of his metoprolol. This was restarted along with  increasing the Imdur and had him take an extra dose of his diuretic.    Comes back today. Here alone. He says he is feeling better. Breathing has improved. Weight is stable. Watching his salt. Says "everything is now better". 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 He has not had his medicines yet today but has otherwise been taking.    Past Medical History  Diagnosis Date  . Gout   . Essential hypertension   . PUD (peptic  ulcer disease)   . Atrial fibrillation     Past Surgical History  Procedure Laterality Date  . Toe surgery    . Cardioversion N/A 12/20/2013    Procedure: CARDIOVERSION;  Surgeon: Lewayne Bunting, MD;  Location: [facility] ENDOSCOPY;  Service: Cardiovascular;  Laterality: N/A;     Medications: Current Outpatient Prescriptions  Medication Sig Dispense Refill  . allopurinol (ZYLOPRIM) 100 MG tablet Take 100 mg by mouth daily as needed (gout attacks).     [name] ELIQUIS 5 MG TABS tablet Take 1 tablet by mouth twice daily 60 tablet 6  . ENSURE (ENSURE) Take 237 mLs by mouth 2 (two) times daily between meals.    . furosemide (LASIX) 40 MG tablet Take 1 tablet (40 mg total) by mouth daily. 30 tablet 6  . HYDROcodone-acetaminophen (NORCO) 7.5-325 MG per tablet Take 1 tablet by mouth every 4 (four) hours as needed. (Patient taking differently: Take 1 tablet by mouth every 4 (four) hours as needed (pain). ) 20 tablet 0  . isosorbide mononitrate (IMDUR) 30 MG 24 hr tablet Take 1 tablet (30 mg total) by mouth daily. 90 tablet 3  . losartan (COZAAR) 100 MG tablet Take 1 tablet (100 mg total) by mouth daily. 30 tablet 0  . metoprolol succinate (TOPROL-XL) 100 MG 24 hr tablet Take 1 tablet (100 mg total) by mouth daily. Take  with or immediately following a meal. 90 tablet 3  . ranitidine (ZANTAC) 150 MG capsule Take once or twice a day as needed for your stomach (Patient taking differently: Take 150 mg by mouth daily as needed (stomach pain/ulcers). Take once or twice a day as needed for your stomach) 60 capsule 3  . spironolactone (ALDACTONE) 25 MG tablet Take 1 tablet (25 mg total) by mouth daily. 90 tablet 3  . hydrALAZINE (APRESOLINE) 50 MG tablet Take 1 tablet (50 mg total) by mouth 3 (three) times daily. 270 tablet 3   No current facility-administered medications for this visit.    Allergies: No Known Allergies  Social History: The patient  reports that he has never smoked. He does not have any  smokeless tobacco history on file. He reports that he drinks alcohol. He reports that he does not use illicit drugs.   Family History: The patient's family history includes Diabetes in his mother; Drug abuse in his brother; Heart disease in his brother and father; Hypertension in his father; Kidney disease in his mother and sister; Stroke in his sister.   Review of Systems: Please see the history of present illness.   Otherwise, the review of systems is positive for none.   All other systems are reviewed and negative.   Physical Exam: VS:  BP 138/100 mmHg  Pulse 63  Resp 18  Ht 6' (1.829 m)  Wt 220 lb (99.791 kg)  BMI 29.83 kg/m2  SpO2 99% .  BMI Body mass index is 29.83 kg/(m^2).  Wt Readings from Last 3 Encounters:  03/27/15 220 lb (99.791 kg)  03/22/15 220 lb (99.791 kg)  02/21/15 216 lb 12.8 oz (98.34 kg)    General: Pleasant. Well developed, well nourished and in no acute distress. His weight is unchanged.  HEENT: Normal. Neck: Supple, no JVD, carotid bruits, or masses noted.  Cardiac: Irregular irregular rhythm. Rate is up a little today (no medicines yet today). No murmurs, rubs, or gallops. No edema.  Respiratory:  Lungs are clear to auscultation bilaterally with normal work of breathing.  GI: Soft and nontender.  MS: No deformity or atrophy. Gait and ROM intact. Skin: Warm and dry. Color is normal.  Neuro:  Strength and sensation are intact and no gross focal deficits noted.  Psych: Alert, appropriate and with normal affect.   LABORATORY DATA:  EKG:  EKG is ordered today. This demonstrates atrial fib - his HR is 109 today.  Lab Results  Component Value Date   WBC 6.1 02/04/2015   HGB 13.2 02/04/2015   HCT 38.5* 02/04/2015   PLT 138* 02/04/2015   GLUCOSE 96 03/22/2015   ALT 38 02/03/2015   AST 37 02/03/2015   NA 141 03/22/2015   K 4.2 03/22/2015   CL 107 03/22/2015   CREATININE 1.60* 03/22/2015   BUN 25* 03/22/2015   CO2 27 03/22/2015   TSH 1.55  10/28/2013    BNP (last 3 results)  Recent Labs  02/03/15 1717  BNP 257.9*    ProBNP (last 3 results) No results for input(s): PROBNP in the last 8760 hours.   Other Studies Reviewed Today:  Echo Study Conclusions from 01/2015  - Left ventricle: The cavity size was normal. Wall thickness was increased in a pattern of mild LVH. Systolic function was moderately reduced. The estimated ejection fraction was in the range of 35% to 40%. Diffuse hypokinesis. The study is not technically sufficient to allow evaluation of LV diastolic function. - Mitral valve: There was  mild regurgitation. - Left atrium: The atrium was severely dilated. - Right ventricle: Systolic function was mildly to moderately reduced. - Right atrium: The atrium was moderately to severely dilated. Central venous pressure (est): 8 mm Hg. - Tricuspid valve: There was mild regurgitation. - Pulmonary arteries: PA peak pressure: 42 mm Hg (S). - Pericardium, extracardiac: There was no pericardial effusion.  Impressions:  - Mild LVH with LVEF 35-40%, diffuse hypokinesis with some septal dyssynergy. There has been reduction in LVEF compared to prior study February 2015. Indeterminate diastolic function - suspect at least grade 2. Severe left atrial enlargement. Mildly to moderately reduced RV contraction. Mild tricuspid regurgitation with PASP 42 mmHg.  Myoview Study Impression The study is normal. This is a high risk study. LV cavity size is mildly enlarged. The left ventricular ejection fraction is moderately decreased (30-44%). There is no prior study for comparison.Global Hypokinesis  Assessment/Plan: 1. HTN -  BP not at goal. I have increased his Hydralazine to 50 mg TID.   2. AF - had RVR - now his rate is more controlled but not ideal however he has not had his medicines yet today. AF is felt to be chronic. Remains on Eliquis - I have left him on his current regimen of rate control  for now.   3. Chronic systolic and diastolic CHF - weight is stable. Looks better. He looks better clinically. 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 He is considering applying for disability.   4. CKD 3 - he is on ARB and full dose aldactone - needs labs rechecked. May not be able to continue long term.   5. Chronic anticoagulation  Current medicines are reviewed with the patient today.  The patient does not have concerns regarding medicines other than what has been noted above.  The following changes have been made:  See above.  Labs/ tests ordered today include:    Orders Placed This Encounter  Procedures  . Basic metabolic panel  . CBC  . EKG 12-Lead     Disposition:   FU with Dr. Jens Som as planned in July.    Patient is agreeable to this plan and will call if any problems develop in the interim.   Signed: Rosalio Macadamia, RN, ANP-C 03/27/2015 12:02 PM  [facility] [address] [place], [place]  [address] Phone: [phone] Fax: [phone]

## 2015-03-27 NOTE — Patient Instructions (Signed)
We will be checking the following labs today - BMET   Medication Instructions:    Continue with your current medicines but  I am increasing the Hydralazine to 50 mg three times a day - you can take 2 of your 25 mg tabs three times a day to use up - the RX for the 50 mg is at your drug store   Testing/Procedures To Be Arranged:  N/A  Follow-Up:   See Dr. Jens Som in July as planned    Other Special Instructions:   Keep restricting the salt  Call the [facility] office at [phone] if you have any questions, problems or concerns.

## 2015-03-28 ENCOUNTER — Telehealth: Payer: Self-pay | Admitting: *Deleted

## 2015-03-28 NOTE — Telephone Encounter (Signed)
-----   Message from Marykay Lex, MD sent at 03/23/2015 11:20 AM EDT ----- As I suspected, the BNP level is elevated. Hopefully 2 days of Lasix will help his breathing. If he still has dyspnea and difficulty breathing and would like for him to take additional Lasix for another 2 days.  Renal function is essentially stable  HARDING, Piedad Climes, M.D., M.S. Interventional Cardiologist   Pager # [phone]  Please forward note to PCP:  Erlinda Hong, MD

## 2015-03-28 NOTE — Telephone Encounter (Signed)
Left message on wife's cell [phone], unable to leave message on home phone to call back

## 2015-03-29 NOTE — Telephone Encounter (Signed)
Returning your call from yesterday. °

## 2015-03-29 NOTE — Telephone Encounter (Signed)
Spoke to patient. Result given . Verbalized understanding TAKING EXTRA FUROSEMIDE ADDITIONAL 2 DAY  KEEP APPOINTMENT WITH DR CRENSHAW.  PATIENT STATES HE IS ABLE TO LAY DOWN AT NIGHT NOT S.O.B.

## 2015-03-30 ENCOUNTER — Other Ambulatory Visit: Payer: Self-pay

## 2015-03-30 MED ORDER — LOSARTAN POTASSIUM 100 MG PO TABS: 100.0000 mg | ORAL_TABLET | Freq: Every day | ORAL | Status: DC

## 2015-04-12 NOTE — Progress Notes (Signed)
HPI: FU atrial fibrillation. Echocardiogram in February 2015 showed normal LV function and mild to moderate biatrial enlargement. TSH January 2015 normal. The patient underwent cardioversion on 12/20/2013. Developed recurrent atrial fibrillation. Admitted 5/15 with afib with RVR; lasix added and metoprolol increased; echo 5/16 showed EF 35-40, mild MR, severe LAE, moderate to severe RAE; reduced RV function. Nuclear study 5/16 showed diaphragmatic attenuation and no ischemia. Also with severe HTN and meds have been recently adjusted. Since last seen,   Current Outpatient Prescriptions  Medication Sig Dispense Refill  . allopurinol (ZYLOPRIM) 100 MG tablet Take 100 mg by mouth daily as needed (gout attacks).     [name] ELIQUIS 5 MG TABS tablet Take 1 tablet by mouth twice daily 60 tablet 6  . ENSURE (ENSURE) Take 237 mLs by mouth 2 (two) times daily between meals.    . furosemide (LASIX) 40 MG tablet Take 1 tablet (40 mg total) by mouth daily. 30 tablet 6  . hydrALAZINE (APRESOLINE) 50 MG tablet Take 1 tablet (50 mg total) by mouth 3 (three) times daily. 270 tablet 3  . HYDROcodone-acetaminophen (NORCO) 7.5-325 MG per tablet Take 1 tablet by mouth every 4 (four) hours as needed. (Patient taking differently: Take 1 tablet by mouth every 4 (four) hours as needed (pain). ) 20 tablet 0  . isosorbide mononitrate (IMDUR) 30 MG 24 hr tablet Take 1 tablet (30 mg total) by mouth daily. 90 tablet 3  . losartan (COZAAR) 100 MG tablet Take 1 tablet (100 mg total) by mouth daily. 30 tablet 11  . metoprolol succinate (TOPROL-XL) 100 MG 24 hr tablet Take 1 tablet (100 mg total) by mouth daily. Take with or immediately following a meal. 90 tablet 3  . ranitidine (ZANTAC) 150 MG capsule Take once or twice a day as needed for your stomach (Patient taking differently: Take 150 mg by mouth daily as needed (stomach pain/ulcers). Take once or twice a day as needed for your stomach) 60 capsule 3  . spironolactone  (ALDACTONE) 25 MG tablet Take 1 tablet (25 mg total) by mouth daily. 90 tablet 3   No current facility-administered medications for this visit.     Past Medical History  Diagnosis Date  . Gout   . Essential hypertension   . PUD (peptic ulcer disease)   . Atrial fibrillation     Past Surgical History  Procedure Laterality Date  . Toe surgery    . Cardioversion N/A 12/20/2013    Procedure: CARDIOVERSION;  Surgeon: Lewayne Bunting, MD;  Location: [facility] ENDOSCOPY;  Service: Cardiovascular;  Laterality: N/A;    History   Social History  . Marital Status: Married    Spouse Name: N/A  . Number of Children: 3  . Years of Education: N/A   Occupational History  .  [name]   Social History Main Topics  . Smoking status: Never Smoker   . Smokeless tobacco: Not on file  . Alcohol Use: Yes     Comment: Occasional  . Drug Use: No  . Sexual Activity: Not on file   Other Topics Concern  . Not on file   Social History Narrative    ROS: no fevers or chills, productive cough, hemoptysis, dysphasia, odynophagia, melena, hematochezia, dysuria, hematuria, rash, seizure activity, orthopnea, PND, pedal edema, claudication. Remaining systems are negative.  Physical Exam: Well-developed well-nourished in no acute distress.  Skin is warm and dry.  HEENT is normal.  Neck is supple.  Chest is clear  to auscultation with normal expansion.  Cardiovascular exam is regular rate and rhythm.  Abdominal exam nontender or distended. No masses palpated. Extremities show no edema. neuro grossly intact  ECG     This encounter was created in error - please disregard.

## 2015-04-16 ENCOUNTER — Encounter: Payer: BC Managed Care – PPO | Admitting: Cardiology

## 2015-04-25 ENCOUNTER — Encounter: Payer: Self-pay | Admitting: Cardiology

## 2015-05-17 ENCOUNTER — Encounter: Payer: Self-pay | Admitting: Cardiology

## 2015-11-27 ENCOUNTER — Other Ambulatory Visit: Payer: Self-pay | Admitting: Nurse Practitioner

## 2016-03-21 ENCOUNTER — Other Ambulatory Visit: Payer: Self-pay | Admitting: Nurse Practitioner

## 2016-03-24 NOTE — Telephone Encounter (Signed)
Rx has been sent to the pharmacy electronically. ° °

## 2016-04-21 ENCOUNTER — Other Ambulatory Visit: Payer: Self-pay | Admitting: Nurse Practitioner

## 2016-05-03 ENCOUNTER — Other Ambulatory Visit: Payer: Self-pay | Admitting: Cardiology

## 2016-05-05 NOTE — Telephone Encounter (Signed)
Rx(s) sent to pharmacy electronically.  

## 2016-05-31 ENCOUNTER — Other Ambulatory Visit: Payer: Self-pay | Admitting: Nurse Practitioner

## 2016-07-09 ENCOUNTER — Other Ambulatory Visit: Payer: Self-pay | Admitting: Cardiology

## 2016-07-09 ENCOUNTER — Other Ambulatory Visit: Payer: Self-pay | Admitting: Nurse Practitioner

## 2016-07-09 MED ORDER — HYDRALAZINE HCL 50 MG PO TABS
50.0000 mg | ORAL_TABLET | Freq: Three times a day (TID) | ORAL | 0 refills | Status: DC
Start: 2016-07-09 — End: 2016-11-01

## 2016-07-09 MED ORDER — LOSARTAN POTASSIUM 100 MG PO TABS: 100.0000 mg | ORAL_TABLET | Freq: Every day | ORAL | 0 refills | Status: DC

## 2016-08-09 ENCOUNTER — Other Ambulatory Visit: Payer: Self-pay | Admitting: Cardiology

## 2016-09-11 ENCOUNTER — Other Ambulatory Visit: Payer: Self-pay | Admitting: Cardiology

## 2016-10-10 ENCOUNTER — Other Ambulatory Visit: Payer: Self-pay | Admitting: Cardiology

## 2016-10-13 NOTE — Telephone Encounter (Signed)
Rx(s) sent to pharmacy electronically.  

## 2016-10-31 ENCOUNTER — Inpatient Hospital Stay (HOSPITAL_COMMUNITY)
Admission: EM | Admit: 2016-10-31 | Discharge: 2016-11-08 | DRG: 389 | Disposition: A | Discharge status: Home / Self Care | Payer: BC Managed Care – PPO | Attending: Internal Medicine | Admitting: Internal Medicine

## 2016-10-31 ENCOUNTER — Encounter (HOSPITAL_COMMUNITY): Payer: Self-pay | Admitting: Emergency Medicine

## 2016-10-31 ENCOUNTER — Emergency Department (HOSPITAL_COMMUNITY): Payer: BC Managed Care – PPO

## 2016-10-31 DIAGNOSIS — E860 Dehydration: Secondary | ICD-10-CM | POA: Diagnosis present

## 2016-10-31 DIAGNOSIS — R0602 Shortness of breath: Secondary | ICD-10-CM

## 2016-10-31 DIAGNOSIS — K56609 Unspecified intestinal obstruction, unspecified as to partial versus complete obstruction: Principal | ICD-10-CM | POA: Diagnosis present

## 2016-10-31 DIAGNOSIS — M109 Gout, unspecified: Secondary | ICD-10-CM | POA: Diagnosis present

## 2016-10-31 DIAGNOSIS — N183 Chronic kidney disease, stage 3 unspecified: Secondary | ICD-10-CM | POA: Diagnosis present

## 2016-10-31 DIAGNOSIS — Z841 Family history of disorders of kidney and ureter: Secondary | ICD-10-CM

## 2016-10-31 DIAGNOSIS — Z8719 Personal history of other diseases of the digestive system: Secondary | ICD-10-CM | POA: Diagnosis present

## 2016-10-31 DIAGNOSIS — I480 Paroxysmal atrial fibrillation: Secondary | ICD-10-CM | POA: Diagnosis present

## 2016-10-31 DIAGNOSIS — Z8249 Family history of ischemic heart disease and other diseases of the circulatory system: Secondary | ICD-10-CM

## 2016-10-31 DIAGNOSIS — N184 Chronic kidney disease, stage 4 (severe): Secondary | ICD-10-CM | POA: Diagnosis present

## 2016-10-31 DIAGNOSIS — Z7901 Long term (current) use of anticoagulants: Secondary | ICD-10-CM

## 2016-10-31 DIAGNOSIS — Z833 Family history of diabetes mellitus: Secondary | ICD-10-CM

## 2016-10-31 DIAGNOSIS — I130 Hypertensive heart and chronic kidney disease with heart failure and stage 1 through stage 4 chronic kidney disease, or unspecified chronic kidney disease: Secondary | ICD-10-CM | POA: Diagnosis present

## 2016-10-31 DIAGNOSIS — I482 Chronic atrial fibrillation: Secondary | ICD-10-CM | POA: Diagnosis present

## 2016-10-31 DIAGNOSIS — K5900 Constipation, unspecified: Secondary | ICD-10-CM | POA: Diagnosis not present

## 2016-10-31 DIAGNOSIS — Z823 Family history of stroke: Secondary | ICD-10-CM

## 2016-10-31 DIAGNOSIS — I10 Essential (primary) hypertension: Secondary | ICD-10-CM | POA: Diagnosis present

## 2016-10-31 DIAGNOSIS — Z8711 Personal history of peptic ulcer disease: Secondary | ICD-10-CM

## 2016-10-31 DIAGNOSIS — I5042 Chronic combined systolic (congestive) and diastolic (congestive) heart failure: Secondary | ICD-10-CM | POA: Diagnosis present

## 2016-10-31 LAB — COMPREHENSIVE METABOLIC PANEL
ALBUMIN: 3.9 g/dL (ref 3.5–5.0)
ALT: 22 U/L (ref 17–63)
ANION GAP: 11 (ref 5–15)
AST: 33 U/L (ref 15–41)
Alkaline Phosphatase: 42 U/L (ref 38–126)
BUN: 10 mg/dL (ref 6–20)
CHLORIDE: 102 mmol/L (ref 101–111)
CO2: 26 mmol/L (ref 22–32)
Calcium: 10.1 mg/dL (ref 8.9–10.3)
Creatinine, Ser: 1.38 mg/dL — ABNORMAL HIGH (ref 0.61–1.24)
GFR calc Af Amer: >60 mL/min (ref >60)
GFR calc non Af Amer: 55 mL/min — ABNORMAL LOW (ref >60)
GLUCOSE: 152 mg/dL — AB (ref 65–99)
POTASSIUM: 4.8 mmol/L (ref 3.5–5.1)
Sodium: 139 mmol/L (ref 135–145)
Total Bilirubin: 2.7 mg/dL — ABNORMAL HIGH (ref 0.3–1.2)
Total Protein: 6.8 g/dL (ref 6.5–8.1)

## 2016-10-31 LAB — URINALYSIS, ROUTINE W REFLEX MICROSCOPIC
BACTERIA UA: NONE SEEN
BILIRUBIN URINE: NEGATIVE
Glucose, UA: NEGATIVE mg/dL
KETONES UR: NEGATIVE mg/dL
LEUKOCYTES UA: NEGATIVE
Nitrite: NEGATIVE
PROTEIN: 30 mg/dL — AB
SPECIFIC GRAVITY, URINE: 1.021 (ref 1.005–1.030)
pH: 5 (ref 5.0–8.0)

## 2016-10-31 LAB — CBC
HEMATOCRIT: 50.1 % (ref 39.0–52.0)
HEMOGLOBIN: 17.6 g/dL — AB (ref 13.0–17.0)
MCH: 32.1 pg (ref 26.0–34.0)
MCHC: 35.1 g/dL (ref 30.0–36.0)
MCV: 91.4 fL (ref 78.0–100.0)
Platelets: 196 10*3/uL (ref 150–400)
RBC: 5.48 MIL/uL (ref 4.22–5.81)
RDW: 13.7 % (ref 11.5–15.5)
WBC: 12.3 10*3/uL — AB (ref 4.0–10.5)

## 2016-10-31 LAB — LIPASE, BLOOD: LIPASE: 14 U/L (ref 11–51)

## 2016-10-31 MED ORDER — ONDANSETRON 4 MG PO TBDP
4.0000 mg | ORAL_TABLET | Freq: Once | ORAL | Status: AC | PRN
  Administered 2016-10-31: 4 mg via ORAL

## 2016-10-31 MED ORDER — IOPAMIDOL (ISOVUE-300) INJECTION 61%
INTRAVENOUS | Status: AC
  Administered 2016-10-31: 100 mL
  Filled 2016-10-31: qty 100

## 2016-10-31 MED ORDER — ONDANSETRON 4 MG PO TBDP
ORAL_TABLET | ORAL | Status: AC
  Filled 2016-10-31: qty 1

## 2016-10-31 NOTE — ED Provider Notes (Signed)
[facility]-EMERGENCY DEPT Provider Note   CSN: [id number] Arrival date & time: 10/31/16  2017     History   Chief Complaint Chief Complaint  Patient presents with  . Constipation  . Abdominal Pain  . Emesis    HPI Javier Berry is a 56 y.o. male.  Patient with no past abdominal surgery history, cardiomyopathy/CHF on lasix, afib on Eliquis, CKD, HTN - presents with complaint of abdominal pain, vomiting, constipation for the past 3 days. Pain is in the mid abdomen and does not radiate. Patient states that he had to give himself an enema. He has been unable to keep down his medications. No fevers, chest pain, shortness of breath. He blames his current symptoms on eating corn and feels this may be stuck in his system. Denies heavy NSAIDs, alcohol, recent abx. Treated at home with an enema. 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 The onset of this condition was acute. The course is constant. Aggravating factors: none. Alleviating factors: none.        Past Medical History:  Diagnosis Date  . Atrial fibrillation (HCC)   . Essential hypertension   . Gout   . PUD (peptic ulcer disease)     Patient Active Problem List   Diagnosis Date Noted  . Congestive dilated cardiomyopathy (HCC) 03/24/2015  . Acute on chronic combined systolic and diastolic congestive heart failure, NYHA class 3 (HCC) 03/22/2015  . Chronic combined systolic and diastolic CHF (congestive heart failure) (HCC) 02/05/2015  . CKD (chronic kidney disease) stage 3, GFR 30-59 ml/min 02/04/2015  . Atrial fibrillation with RVR (HCC) 02/03/2015  . Hypertensive urgency 02/03/2015  . Chest pain 10/28/2013  . Essential hypertension 10/17/2013  . Gout 10/17/2013  . Atrial fibrillation (HCC) 10/17/2013    Past Surgical History:  Procedure Laterality Date  . CARDIOVERSION N/A 12/20/2013   Procedure: CARDIOVERSION;  Surgeon: Lewayne Bunting, MD;  Location: [facility]  ENDOSCOPY;  Service: Cardiovascular;  Laterality: N/A;  . TOE SURGERY         Home Medications    Prior to Admission medications   Medication Sig Start Date End Date Taking? Authorizing Provider  allopurinol (ZYLOPRIM) 100 MG tablet Take 100 mg by mouth daily as needed (gout attacks).     Historical Provider, MD  ELIQUIS 5 MG TABS tablet TAKE 1 TABLET BY MOUTH TWICE DAILY 03/21/16   Marykay Lex, MD  ENSURE (ENSURE) Take 237 mLs by mouth 2 (two) times daily between meals.    Historical Provider, MD  furosemide (LASIX) 40 MG tablet Take 1 tablet (40 mg total) by mouth daily. Need appointment before anymore refills 03/24/16   Lewayne Bunting, MD  hydrALAZINE (APRESOLINE) 50 MG tablet Take 1 tablet (50 mg total) by mouth 3 (three) times daily. 07/09/16   Marykay Lex, MD  HYDROcodone-acetaminophen (NORCO) 7.5-325 MG per tablet Take 1 tablet by mouth every 4 (four) hours as needed. Patient taking differently: Take 1 tablet by mouth every 4 (four) hours as needed (pain).  12/08/14   Charm Rings, MD  isosorbide mononitrate (IMDUR) 30 MG 24 hr tablet TAKE 1 TABLET (30 MG TOTAL) BY MOUTH DAILY FOR HEART 05/05/16   Lewayne Bunting, MD  losartan (COZAAR) 100 MG tablet Take 1 tablet (100 mg total) by mouth daily. 07/09/16   Marykay Lex, MD  metoprolol succinate (TOPROL-XL) 100 MG 24 hr tablet Take 1 tablet (100 mg total) by  mouth daily. Take with or immediately following a meal. 03/22/15   Marykay Lex, MD  ranitidine (ZANTAC) 150 MG capsule Take once or twice a day as needed for your stomach Patient taking differently: Take 150 mg by mouth daily as needed (stomach pain/ulcers). Take once or twice a day as needed for your stomach 10/17/13   Pearline Cables, MD  spironolactone (ALDACTONE) 25 MG tablet Take 1 tablet (25 mg total) by mouth daily. <PLEASE MAKE APPOINTMENT FOR REFILLS> 10/13/16   Marykay Lex, MD    Family History Family History  Problem Relation Age of Onset  . Kidney  disease Mother   . Diabetes Mother   . Hypertension Father   . Heart disease Father     CHF  . Kidney disease Sister   . Stroke Sister   . Drug abuse Brother   . Heart disease Brother     Murmur    Social History Social History  Substance Use Topics  . Smoking status: Never Smoker  . Smokeless tobacco: Never Used  . Alcohol use No     Comment: Occasional     Allergies   Patient has no known allergies.   Review of Systems Review of Systems  Constitutional: Negative for fever.  HENT: Negative for rhinorrhea and sore throat.   Eyes: Negative for redness.  Respiratory: Negative for cough.   Cardiovascular: Negative for chest pain.  Gastrointestinal: Positive for abdominal distention, constipation, nausea and vomiting. Negative for abdominal pain, blood in stool and diarrhea.  Genitourinary: Negative for dysuria.  Musculoskeletal: Negative for myalgias.  Skin: Negative for rash.  Neurological: Negative for headaches.     Physical Exam Updated Vital Signs BP (!) 183/134 (BP Location: Right Arm)   Pulse 101   Temp 98.3 F (36.8 C) (Oral)   Resp 20   Ht 6' (1.829 m)   Wt 93 kg   SpO2 98%   BMI 27.80 kg/m   Physical Exam  Constitutional: He appears well-developed and well-nourished.  HENT:  Head: Normocephalic and atraumatic.  Mouth/Throat: Oropharynx is clear and moist.  Eyes: Conjunctivae are normal. Right eye exhibits no discharge. Left eye exhibits no discharge.  Neck: Normal range of motion. Neck supple.  Cardiovascular: Normal rate, regular rhythm and normal heart sounds.   Pulmonary/Chest: Effort normal and breath sounds normal.  Abdominal: Soft. He exhibits distension. He exhibits no mass. There is tenderness (periumbilical, mild-moderate). There is no rebound and no guarding.  Neurological: He is alert.  Skin: Skin is warm and dry.  Psychiatric: He has a normal mood and affect.  Nursing note and vitals reviewed.    ED Treatments / Results   Labs (all labs ordered are listed, but only abnormal results are displayed) Labs Reviewed  COMPREHENSIVE METABOLIC PANEL - Abnormal; Notable for the following:       Result Value   Glucose, Bld 152 (*)    Creatinine, Ser 1.38 (*)    Total Bilirubin 2.7 (*)    GFR calc non Af Amer 55 (*)    All other components within normal limits  CBC - Abnormal; Notable for the following:    WBC 12.3 (*)    Hemoglobin 17.6 (*)    All other components within normal limits  URINALYSIS, ROUTINE W REFLEX MICROSCOPIC - Abnormal; Notable for the following:    Hgb urine dipstick MODERATE (*)    Protein, ur 30 (*)    Squamous Epithelial / LPF 0-5 (*)    All other  components within normal limits  LIPASE, BLOOD     Radiology Ct Abdomen Pelvis W Contrast  Result Date: 11/01/2016 CLINICAL DATA:  Periumbilical pain with episodes of emesis for 2 days. EXAM: CT ABDOMEN AND PELVIS WITH CONTRAST TECHNIQUE: Multidetector CT imaging of the abdomen and pelvis was performed using the standard protocol following bolus administration of intravenous contrast. CONTRAST:  ISOVUE-300 IOPAMIDOL (ISOVUE-300) INJECTION 61% COMPARISON:  None. FINDINGS: Lower chest: No acute abnormality. Hepatobiliary: No focal liver abnormality is seen. No gallstones, gallbladder wall thickening, or biliary dilatation. Pancreas: Unremarkable. No pancreatic ductal dilatation or surrounding inflammatory changes. Spleen: Normal in size without focal abnormality. Adrenals/Urinary Tract: Adrenal glands are unremarkable. Kidneys are normal, without renal calculi, focal lesion, or hydronephrosis. Bladder is unremarkable. Stomach/Bowel: Dilatation of the stomach and small bowel with abrupt transition to decompressed ileum. At the transition point in the right lower quadrant, no mass or hernia is evident. Colon is decompressed. Appendix is normal. Vascular/Lymphatic: The abdominal aorta is normal in caliber with mild atherosclerotic calcification.  Reproductive: Unremarkable Other: Small volume ascites, perihepatic and in the dependent pelvis. Musculoskeletal: No significant skeletal lesion. IMPRESSION: Dilatation of small bowel with caliber transition in the ileum, consistent with a small bowel obstruction. No mass or hernia is evident at the transition point. Small volume ascites. Electronically Signed   By: Ellery Plunk M.D.   On: 11/01/2016 00:00    Procedures Procedures (including critical care time)  Medications Ordered in ED Medications  sodium chloride 0.9 % bolus 1,000 mL (not administered)  0.9 %  sodium chloride infusion (not administered)  ondansetron (ZOFRAN-ODT) disintegrating tablet 4 mg (4 mg Oral Given 10/31/16 2041)  iopamidol (ISOVUE-300) 61 % injection (100 mLs  Contrast Given 10/31/16 2338)     Initial Impression / Assessment and Plan / ED Course  I have reviewed the triage vital signs and the nursing notes.  Pertinent labs & imaging results that were available during my care of the patient were reviewed by me and considered in my medical decision making (see chart for details).     Patient seen and examined. Work-up initiated. Medications ordered. Will obtain CT abd/pelvis.   Vital signs reviewed and are as follows: BP (!) 183/134 (BP Location: Right Arm)   Pulse 101   Temp 98.3 F (36.8 C) (Oral)   Resp 20   Ht 6' (1.829 m)   Wt 93 kg   SpO2 98%   BMI 27.80 kg/m   12:28 AM CT shows SBO with transition point in ilium. Will need NG and admission.   12:37 AM Spoke with Dr. Criselda Peaches who will see.   Final Clinical Impressions(s) / ED Diagnoses   Final diagnoses:  Small bowel obstruction   Admit SBO, transition point in ilium, unclear etiology.   New Prescriptions New Prescriptions   No medications on file     Renne Crigler, PA-C 11/01/16 0038    Gwyneth Sprout, MD 11/02/16 2244

## 2016-10-31 NOTE — ED Triage Notes (Signed)
Pt presents with abd pain with constipation since yesterday; pt reports 7 episodes of vomiting today- unable to keep BP medication down; hypertensive in triage (183/134); pt reports this happens when he eats corn, and ate it [place]

## 2016-11-01 ENCOUNTER — Inpatient Hospital Stay (HOSPITAL_COMMUNITY): Payer: BC Managed Care – PPO

## 2016-11-01 ENCOUNTER — Encounter (HOSPITAL_COMMUNITY): Payer: Self-pay | Admitting: Internal Medicine

## 2016-11-01 DIAGNOSIS — I10 Essential (primary) hypertension: Secondary | ICD-10-CM | POA: Diagnosis not present

## 2016-11-01 DIAGNOSIS — N183 Chronic kidney disease, stage 3 (moderate): Secondary | ICD-10-CM | POA: Diagnosis not present

## 2016-11-01 DIAGNOSIS — I4891 Unspecified atrial fibrillation: Secondary | ICD-10-CM

## 2016-11-01 DIAGNOSIS — N184 Chronic kidney disease, stage 4 (severe): Secondary | ICD-10-CM | POA: Diagnosis not present

## 2016-11-01 DIAGNOSIS — Z841 Family history of disorders of kidney and ureter: Secondary | ICD-10-CM | POA: Diagnosis not present

## 2016-11-01 DIAGNOSIS — K5900 Constipation, unspecified: Secondary | ICD-10-CM | POA: Diagnosis present

## 2016-11-01 DIAGNOSIS — E860 Dehydration: Secondary | ICD-10-CM | POA: Diagnosis not present

## 2016-11-01 DIAGNOSIS — Z8719 Personal history of other diseases of the digestive system: Secondary | ICD-10-CM | POA: Diagnosis present

## 2016-11-01 DIAGNOSIS — Z8249 Family history of ischemic heart disease and other diseases of the circulatory system: Secondary | ICD-10-CM | POA: Diagnosis not present

## 2016-11-01 DIAGNOSIS — Z823 Family history of stroke: Secondary | ICD-10-CM | POA: Diagnosis not present

## 2016-11-01 DIAGNOSIS — I130 Hypertensive heart and chronic kidney disease with heart failure and stage 1 through stage 4 chronic kidney disease, or unspecified chronic kidney disease: Secondary | ICD-10-CM | POA: Diagnosis not present

## 2016-11-01 DIAGNOSIS — M109 Gout, unspecified: Secondary | ICD-10-CM | POA: Diagnosis not present

## 2016-11-01 DIAGNOSIS — I482 Chronic atrial fibrillation: Secondary | ICD-10-CM | POA: Diagnosis not present

## 2016-11-01 DIAGNOSIS — Z7901 Long term (current) use of anticoagulants: Secondary | ICD-10-CM | POA: Diagnosis not present

## 2016-11-01 DIAGNOSIS — Z833 Family history of diabetes mellitus: Secondary | ICD-10-CM | POA: Diagnosis not present

## 2016-11-01 DIAGNOSIS — K56609 Unspecified intestinal obstruction, unspecified as to partial versus complete obstruction: Secondary | ICD-10-CM | POA: Diagnosis not present

## 2016-11-01 DIAGNOSIS — Z8711 Personal history of peptic ulcer disease: Secondary | ICD-10-CM | POA: Diagnosis not present

## 2016-11-01 DIAGNOSIS — Z01810 Encounter for preprocedural cardiovascular examination: Secondary | ICD-10-CM | POA: Diagnosis not present

## 2016-11-01 DIAGNOSIS — I5042 Chronic combined systolic (congestive) and diastolic (congestive) heart failure: Secondary | ICD-10-CM | POA: Diagnosis not present

## 2016-11-01 HISTORY — DX: Personal history of other diseases of the digestive system: Z87.19

## 2016-11-01 LAB — COMPREHENSIVE METABOLIC PANEL
ALK PHOS: 34 U/L — AB (ref 38–126)
ALT: 19 U/L (ref 17–63)
AST: 25 U/L (ref 15–41)
Albumin: 3.1 g/dL — ABNORMAL LOW (ref 3.5–5.0)
Anion gap: 8 (ref 5–15)
BILIRUBIN TOTAL: 2.3 mg/dL — AB (ref 0.3–1.2)
BUN: 9 mg/dL (ref 6–20)
CALCIUM: 9.2 mg/dL (ref 8.9–10.3)
CO2: 26 mmol/L (ref 22–32)
CREATININE: 1.33 mg/dL — AB (ref 0.61–1.24)
Chloride: 106 mmol/L (ref 101–111)
GFR calc Af Amer: >60 mL/min (ref >60)
GFR, EST NON AFRICAN AMERICAN: 58 mL/min — AB (ref >60)
GLUCOSE: 122 mg/dL — AB (ref 65–99)
POTASSIUM: 4.2 mmol/L (ref 3.5–5.1)
Sodium: 140 mmol/L (ref 135–145)
TOTAL PROTEIN: 5.7 g/dL — AB (ref 6.5–8.1)

## 2016-11-01 LAB — CBC
HCT: 44.1 % (ref 39.0–52.0)
Hemoglobin: 15.6 g/dL (ref 13.0–17.0)
MCH: 32.4 pg (ref 26.0–34.0)
MCHC: 35.4 g/dL (ref 30.0–36.0)
MCV: 91.5 fL (ref 78.0–100.0)
PLATELETS: 177 10*3/uL (ref 150–400)
RBC: 4.82 MIL/uL (ref 4.22–5.81)
RDW: 13.8 % (ref 11.5–15.5)
WBC: 11.1 10*3/uL — AB (ref 4.0–10.5)

## 2016-11-01 LAB — LACTATE DEHYDROGENASE: LDH: 184 U/L (ref 98–192)

## 2016-11-01 MED ORDER — HYDRALAZINE HCL 20 MG/ML IJ SOLN
10.0000 mg | Freq: Four times a day (QID) | INTRAMUSCULAR | Status: DC | PRN
  Administered 2016-11-02 – 2016-11-04 (×2): 10 mg via INTRAVENOUS
  Filled 2016-11-01 (×2): qty 1

## 2016-11-01 MED ORDER — SODIUM CHLORIDE 0.9 % IV BOLUS (SEPSIS)
1000.0000 mL | Freq: Once | INTRAVENOUS | Status: AC
  Administered 2016-11-01: 1000 mL via INTRAVENOUS

## 2016-11-01 MED ORDER — DILTIAZEM HCL 25 MG/5ML IV SOLN
10.0000 mg | Freq: Four times a day (QID) | INTRAVENOUS | Status: DC | PRN
Start: 2016-11-01 — End: 2016-11-01
  Administered 2016-11-01: 10 mg via INTRAVENOUS
  Filled 2016-11-01 (×2): qty 5

## 2016-11-01 MED ORDER — NITROGLYCERIN 2 % TD OINT
0.5000 [in_us] | TOPICAL_OINTMENT | Freq: Four times a day (QID) | TRANSDERMAL | Status: DC
  Administered 2016-11-01 – 2016-11-05 (×14): 0.5 [in_us] via TOPICAL
  Filled 2016-11-01 (×2): qty 30

## 2016-11-01 MED ORDER — SODIUM CHLORIDE 0.9% FLUSH
3.0000 mL | Freq: Two times a day (BID) | INTRAVENOUS | Status: DC
  Administered 2016-11-01 – 2016-11-02 (×2): 3 mL via INTRAVENOUS

## 2016-11-01 MED ORDER — MORPHINE SULFATE (PF) 4 MG/ML IV SOLN
4.0000 mg | Freq: Once | INTRAVENOUS | Status: AC
  Administered 2016-11-01: 4 mg via INTRAVENOUS
  Filled 2016-11-01: qty 1

## 2016-11-01 MED ORDER — ONDANSETRON HCL 4 MG/2ML IJ SOLN: 4.0000 mg | Freq: Four times a day (QID) | INTRAMUSCULAR | Status: DC | PRN

## 2016-11-01 MED ORDER — ORAL CARE MOUTH RINSE
15.0000 mL | Freq: Two times a day (BID) | OROMUCOSAL | Status: DC
  Administered 2016-11-01 – 2016-11-04 (×6): 15 mL via OROMUCOSAL

## 2016-11-01 MED ORDER — DILTIAZEM HCL 25 MG/5ML IV SOLN
10.0000 mg | Freq: Four times a day (QID) | INTRAVENOUS | Status: DC
  Administered 2016-11-01 – 2016-11-02 (×3): 10 mg via INTRAVENOUS
  Filled 2016-11-01 (×6): qty 5

## 2016-11-01 MED ORDER — ALBUTEROL SULFATE (2.5 MG/3ML) 0.083% IN NEBU: 3.0000 mL | INHALATION_SOLUTION | Freq: Four times a day (QID) | RESPIRATORY_TRACT | Status: DC | PRN

## 2016-11-01 MED ORDER — SODIUM CHLORIDE 0.9 % IV SOLN
INTRAVENOUS | Status: DC
  Administered 2016-11-01 (×2): via INTRAVENOUS

## 2016-11-01 MED ORDER — MORPHINE SULFATE (PF) 2 MG/ML IV SOLN
1.0000 mg | INTRAVENOUS | Status: DC | PRN
  Administered 2016-11-01: 2 mg via INTRAVENOUS
  Administered 2016-11-02: 1 mg via INTRAVENOUS
  Administered 2016-11-03: 2 mg via INTRAVENOUS
  Filled 2016-11-01 (×3): qty 1

## 2016-11-01 MED ORDER — DILTIAZEM HCL 25 MG/5ML IV SOLN
10.0000 mg | Freq: Once | INTRAVENOUS | Status: AC
Start: 2016-11-01 — End: 2016-11-01
  Administered 2016-11-01: 10 mg via INTRAVENOUS
  Filled 2016-11-01: qty 5

## 2016-11-01 MED ORDER — METOPROLOL TARTRATE 5 MG/5ML IV SOLN
5.0000 mg | Freq: Three times a day (TID) | INTRAVENOUS | Status: DC
  Administered 2016-11-01 – 2016-11-03 (×8): 5 mg via INTRAVENOUS
  Filled 2016-11-01 (×9): qty 5

## 2016-11-01 MED ORDER — DIATRIZOATE MEGLUMINE & SODIUM 66-10 % PO SOLN
90.0000 mL | Freq: Once | ORAL | Status: AC
  Administered 2016-11-01: 90 mL via NASOGASTRIC
  Filled 2016-11-01: qty 90

## 2016-11-01 MED ORDER — SODIUM CHLORIDE 0.9 % IV SOLN: INTRAVENOUS | Status: DC

## 2016-11-01 MED ORDER — ENOXAPARIN SODIUM 100 MG/ML ~~LOC~~ SOLN
90.0000 mg | Freq: Two times a day (BID) | SUBCUTANEOUS | Status: DC
  Administered 2016-11-01 – 2016-11-05 (×9): 90 mg via SUBCUTANEOUS
  Filled 2016-11-01 (×9): qty 1

## 2016-11-01 MED ORDER — ONDANSETRON HCL 4 MG/2ML IJ SOLN
4.0000 mg | Freq: Once | INTRAMUSCULAR | Status: AC
  Administered 2016-11-01: 4 mg via INTRAVENOUS
  Filled 2016-11-01: qty 2

## 2016-11-01 MED ORDER — POTASSIUM CHLORIDE IN NACL 40-0.9 MEQ/L-% IV SOLN
INTRAVENOUS | Status: DC
Start: 2016-11-01 — End: 2016-11-06
  Administered 2016-11-01 – 2016-11-06 (×5): 50 mL/h via INTRAVENOUS
  Filled 2016-11-01 (×8): qty 1000

## 2016-11-01 MED ORDER — HYDRALAZINE HCL 20 MG/ML IJ SOLN: 5.0000 mg | INTRAMUSCULAR | Status: DC | PRN

## 2016-11-01 NOTE — Plan of Care (Signed)
HR begin to climb. 110-low 140s.  Still asymptomatic. Will continue to monitor.

## 2016-11-01 NOTE — ED Notes (Signed)
Failed NG attempt x2. Unable to advance in either nare.

## 2016-11-01 NOTE — Plan of Care (Signed)
NG tube clamped for meds

## 2016-11-01 NOTE — H&P (Signed)
History and Physical    EUDELL MCPHEE AVW:[id number] DOB: [birth year] DOA: 10/31/2016  PCP: Erlinda Hong, MD  Patient coming from: Home  Chief Complaint: abdominal pain  HPI: Javier Berry is a 56 y.o. male with medical history significant of Atrial fibrillation, HTN, PUD who presents with abdominal pain, nausea and vomiting for 3 days.  The abdominal pain is diffuse, squeezing and 10/10.  Nothing has really made it better.   This was preceded by constipation for which he attempted to give himself an enema today with little success and only a small amount of solid material.  He reports that he thinks this started when he ate corn, as he feels that this food does not digest well and may be stopping him up.  He reports last BM 3 days ago, no flatus.  He has had 5-6 episodes of emesis which are non bloody and non bilious.  He has not been able to eat or keep down any medications in this time.  He reports getting hot and cold occasionally, but no true fevers.  He will occasionally be SOB in the morning and this gets better as the day progresses.  He reports leg cramping at night and having to move his legs around to feel better.  He is 56 yo and reports never having a colonoscopy.  He does not have a family history of IBD or colon cancer.  He does take pain medication, but reports not taking the narcotic daily.   ED Course: In the ED he had a CT abdomen and pelvis which showed an SBO.  NG tube was placed.  Cr was found to be 1.38, but this was within his baseline.  T bili was found to be 2.7.  WBC was found to be 12.3.  Urine + for moderate hemoglobin and 6-30 RBC.  Review of Systems: As per HPI otherwise 10 point review of systems negative.   Past Medical History:  Diagnosis Date  . Atrial fibrillation (HCC)   . Essential hypertension   . Gout   . PUD (peptic ulcer disease)     Past Surgical History:  Procedure Laterality Date  . CARDIOVERSION N/A 12/20/2013   Procedure: CARDIOVERSION;   Surgeon: Lewayne Bunting, MD;  Location: [facility] ENDOSCOPY;  Service: Cardiovascular;  Laterality: N/A;  . TOE SURGERY     Confirmed with patient  reports that he has never smoked. He has never used smokeless tobacco. He reports that he does not drink alcohol or use drugs.  No Known Allergies  Confirmed with patient.  Family History  Problem Relation Age of Onset  . Kidney disease Mother   . Diabetes Mother   . Hypertension Father   . Heart disease Father     CHF  . Kidney disease Sister   . Stroke Sister   . Drug abuse Brother   . Heart disease Brother     Murmur  . Colon cancer Neg Hx     Prior to Admission medications   Medication Sig Start Date End Date Taking? Authorizing Provider  allopurinol (ZYLOPRIM) 100 MG tablet Take 100 mg by mouth daily as needed (gout attacks).     Historical Provider, MD  ELIQUIS 5 MG TABS tablet TAKE 1 TABLET BY MOUTH TWICE DAILY 03/21/16   Marykay Lex, MD  ENSURE (ENSURE) Take 237 mLs by mouth 2 (two) times daily between meals.    Historical Provider, MD  furosemide (LASIX) 40 MG tablet Take 1 tablet (40 mg total) by  mouth daily. Need appointment before anymore refills 03/24/16   Lewayne Bunting, MD  hydrALAZINE (APRESOLINE) 50 MG tablet Take 1 tablet (50 mg total) by mouth 3 (three) times daily. 07/09/16   Marykay Lex, MD  HYDROcodone-acetaminophen (NORCO) 7.5-325 MG per tablet Take 1 tablet by mouth every 4 (four) hours as needed. Patient taking differently: Take 1 tablet by mouth every 4 (four) hours as needed (pain).  12/08/14   Charm Rings, MD  isosorbide mononitrate (IMDUR) 30 MG 24 hr tablet TAKE 1 TABLET (30 MG TOTAL) BY MOUTH DAILY FOR HEART 05/05/16   Lewayne Bunting, MD  losartan (COZAAR) 100 MG tablet Take 1 tablet (100 mg total) by mouth daily. 07/09/16   Marykay Lex, MD  metoprolol succinate (TOPROL-XL) 100 MG 24 hr tablet Take 1 tablet (100 mg total) by mouth daily. Take with or immediately following a meal. 03/22/15   Marykay Lex, MD  ranitidine (ZANTAC) 150 MG capsule Take once or twice a day as needed for your stomach Patient taking differently: Take 150 mg by mouth daily as needed (stomach pain/ulcers). Take once or twice a day as needed for your stomach 10/17/13   Pearline Cables, MD  spironolactone (ALDACTONE) 25 MG tablet Take 1 tablet (25 mg total) by mouth daily. <PLEASE MAKE APPOINTMENT FOR REFILLS> 10/13/16   Marykay Lex, MD    Physical Exam: Vitals:   10/31/16 2026 10/31/16 2029  BP: (!) 183/134   Pulse: 101   Resp: 20   Temp: 98.3 F (36.8 C)   TempSrc: Oral   SpO2: 98%   Weight:  205 lb (93 kg)  Height:  6' (1.829 m)    Constitutional: Sitting in bed, looks somewhat uncomfortable.  Vitals:   10/31/16 2026 10/31/16 2029  BP: (!) 183/134   Pulse: 101   Resp: 20   Temp: 98.3 F (36.8 C)   TempSrc: Oral   SpO2: 98%   Weight:  205 lb (93 kg)  Height:  6' (1.829 m)   Eyes: PERRL, Icteric sclerae, + conjunctival injection ENMT: Mucous membranes are moist. Oropharynx without erythema Respiratory: clear to auscultation bilaterally, no wheezing, no crackles. Normal respiratory effort.   Cardiovascular: Tachycardic, irreg irreg, no murmurs / rubs / gallops. No extremity edema.   Abdomen: Mild tenderness throughout, absent bowel sounds except some high pitched sounds briefly heard on the right.  Mild distention but still soft  Musculoskeletal: no clubbing / cyanosis. No joint deformity upper and lower extremities.  Normal muscle tone.  Skin: no rashes, lesions, ulcers.  Neurologic:  Sensation intact to light touch, Moving all extremities.  Psychiatric: Normal judgment and insight. Alert and oriented x 3.    Labs on Admission: I have personally reviewed following labs and imaging studies  CBC:  Recent Labs Lab 10/31/16 2036  WBC 12.3*  HGB 17.6*  HCT 50.1  MCV 91.4  PLT 196   Basic Metabolic Panel:  Recent Labs Lab 10/31/16 2036  NA 139  K 4.8  CL 102  CO2 26    GLUCOSE 152*  BUN 10  CREATININE 1.38*  CALCIUM 10.1   GFR: Estimated Creatinine Clearance: 64.8 mL/min (by C-G formula based on SCr of 1.38 mg/dL (H)). Liver Function Tests:  Recent Labs Lab 10/31/16 2036  AST 33  ALT 22  ALKPHOS 42  BILITOT 2.7*  PROT 6.8  ALBUMIN 3.9    Recent Labs Lab 10/31/16 2036  LIPASE 14   No results for input(s):  AMMONIA in the last 168 hours. Coagulation Profile: No results for input(s): INR, PROTIME in the last 168 hours. Cardiac Enzymes: No results for input(s): CKTOTAL, CKMB, CKMBINDEX, TROPONINI in the last 168 hours. BNP (last 3 results) No results for input(s): PROBNP in the last 8760 hours. HbA1C: No results for input(s): HGBA1C in the last 72 hours. CBG: No results for input(s): GLUCAP in the last 168 hours. Lipid Profile: No results for input(s): CHOL, HDL, LDLCALC, TRIG, CHOLHDL, LDLDIRECT in the last 72 hours. Thyroid Function Tests: No results for input(s): TSH, T4TOTAL, FREET4, T3FREE, THYROIDAB in the last 72 hours. Anemia Panel: No results for input(s): VITAMINB12, FOLATE, FERRITIN, TIBC, IRON, RETICCTPCT in the last 72 hours. Urine analysis:    Component Value Date/Time   COLORURINE YELLOW 10/31/2016 2040   APPEARANCEUR CLEAR 10/31/2016 2040   LABSPEC 1.021 10/31/2016 2040   PHURINE 5.0 10/31/2016 2040   GLUCOSEU NEGATIVE 10/31/2016 2040   HGBUR MODERATE (A) 10/31/2016 2040   BILIRUBINUR NEGATIVE 10/31/2016 2040   KETONESUR NEGATIVE 10/31/2016 2040   PROTEINUR 30 (A) 10/31/2016 2040   NITRITE NEGATIVE 10/31/2016 2040   LEUKOCYTESUR NEGATIVE 10/31/2016 2040     Radiological Exams on Admission: Ct Abdomen Pelvis W Contrast  Result Date: 11/01/2016 CLINICAL DATA:  Periumbilical pain with episodes of emesis for 2 days. EXAM: CT ABDOMEN AND PELVIS WITH CONTRAST TECHNIQUE: Multidetector CT imaging of the abdomen and pelvis was performed using the standard protocol following bolus administration of intravenous  contrast. CONTRAST:  ISOVUE-300 IOPAMIDOL (ISOVUE-300) INJECTION 61% COMPARISON:  None. FINDINGS: Lower chest: No acute abnormality. Hepatobiliary: No focal liver abnormality is seen. No gallstones, gallbladder wall thickening, or biliary dilatation. Pancreas: Unremarkable. No pancreatic ductal dilatation or surrounding inflammatory changes. Spleen: Normal in size without focal abnormality. Adrenals/Urinary Tract: Adrenal glands are unremarkable. Kidneys are normal, without renal calculi, focal lesion, or hydronephrosis. Bladder is unremarkable. Stomach/Bowel: Dilatation of the stomach and small bowel with abrupt transition to decompressed ileum. At the transition point in the right lower quadrant, no mass or hernia is evident. Colon is decompressed. Appendix is normal. Vascular/Lymphatic: The abdominal aorta is normal in caliber with mild atherosclerotic calcification. Reproductive: Unremarkable Other: Small volume ascites, perihepatic and in the dependent pelvis. Musculoskeletal: No significant skeletal lesion. IMPRESSION: Dilatation of small bowel with caliber transition in the ileum, consistent with a small bowel obstruction. No mass or hernia is evident at the transition point. Small volume ascites. Electronically Signed   By: Ellery Plunk M.D.   On: 11/01/2016 00:00      Assessment/Plan SBO (small bowel obstruction) - Unclear cause, possibly related to use of pain medication causing constipation (he reports not taking his pain medication often or daily)  - Place NGT in the ED to [facility] - Daily KUB - ordered for tomorrow morning - Frequent abdominal exams - NPO - IVF with NS at 100cc/hr    Essential hypertension - He is not able to tolerate PO medications at this time - IV hydralazine PRN for SBP > 180    Atrial fibrillation  - HR ranging from 100s-140s - IV metoprolol 5mg  q8h to start while NPO, monitor for HR changes and need to increase/decrease - Telemetry - Hold eliquis,  ordered full dose lovenox per pharmacy  Leukocytosis - Based on elevated Hgb as well, I think this is related to dehydration - IVF as above - Repeat CBC in the AM    CKD (chronic kidney disease) stage 3, GFR 30-59 ml/min - Cr is at baseline,  monitor with daily BMET    Chronic combined systolic and diastolic CHF (congestive heart failure)  - He is somewhat dehydrated - Hold lasix - Monitor strict I/O, daily weight  - May need to hold fluids if becoming volume overloaded - Can add back IV lasix if needed.    Hyperbilirubinemia - Given normal ALP and AST/ALT - most likely related to hemolysis (less likely, no anemia) or an inherited disorder such as Gilbert's disease - Check CMET in the AM - Check LDH  Hematuria - Noted in UA, repeat UA to see if this resolves or is persistent.      DVT prophylaxis: Lovenox  Code Status: Full  Disposition Plan: Pending resolution of SBO  Admission status: Inpatient, telemetry   Debe Coder MD [facility] Pager [phone]  If 7PM-7AM, please contact night-coverage [email or web address] Password [email or web address]  11/01/2016, 1:54 AM

## 2016-11-01 NOTE — ED Notes (Signed)
MD informed of failed attempts for NG tube insertion. MD gives verbal order to attempt to insert smaller tube.

## 2016-11-01 NOTE — Progress Notes (Addendum)
PROGRESS NOTE                                                                                                                                                                                                             Patient Demographics:    Javier Berry, is a 56 y.o. male, DOB - 1961-09-21, OTR:[id number]  Admit date - 10/31/2016   Admitting Physician Inez Catalina, MD  Outpatient Primary MD for the patient is [facility], MD  LOS - 0  Chief Complaint  Patient presents with  . Constipation  . Abdominal Pain  . Emesis       Brief Narrative   Javier Berry is a 56 y.o. male with medical history significant of Atrial fibrillation, HTN, PUD who presents with abdominal pain, nausea and vomiting for 3 days, he was diagnosed with small bowel obstruction admitted on 11/01/2016.   Subjective:    Javier Berry today has, No headache, No chest pain, No abdominal pain - mild Nausea, No new weakness tingling or numbness, No Cough - SOB.    Assessment  & Plan :     1. SBO obstruction. No previous abdominal surgeries, ever had surveillance colonoscopy. Currently has NG tube, nothing by mouth, IV fluids, pain-free with mild nausea, surgery consulted.  2. Chronic A. fib currently in RVR. [place] vasc 2 score of 2 -  Eliquis on hold, is being placed on scheduled Lopressor and Cardizem IV, monitor on telemetry, if rate not well controlled will place on Cardizem drip. Follows with Dr. Jens Som.  3. Chronic Combined systolic and diastolic CHF EF 83%. Currently compensated. IV fluids with caution and monitor.  4. Hypertension. Lopressor and Cardizem as above and monitor.    Family Communication  :  None  Code Status :  Full  Diet : Diet NPO time specified   Disposition Plan  :  Tele  Consults  :  CCS  Procedures  :  CT scan abdomen and pelvis showing small bowel obstruction with transition point around the ileum.  DVT  Prophylaxis  :  Lovenox    Lab Results  Component Value Date   PLT 177 11/01/2016    Inpatient Medications  Scheduled Meds: . diatrizoate meglumine-sodium  90 mL Per NG tube Once  . diltiazem  10 mg Intravenous Q6H  . enoxaparin (LOVENOX) injection  90 mg Subcutaneous BID  . mouth rinse  15 mL Mouth Rinse BID  . metoprolol  5 mg Intravenous Q8H  . sodium chloride flush  3 mL Intravenous Q12H   Continuous Infusions: . 0.9 % NaCl with KCl 40 mEq / L     PRN Meds:.albuterol, hydrALAZINE, morphine injection, ondansetron (ZOFRAN) IV  Antibiotics  :    Anti-infectives    None         Objective:   Vitals:   10/31/16 2029 11/01/16 0213 11/01/16 0332 11/01/16 0442  BP:  (!) 142/119 131/90 (!) 152/111  Pulse:  66  83  Resp:  17  18  Temp:  97.6 F (36.4 C)  98.2 F (36.8 C)  TempSrc:  Oral  Oral  SpO2:  98%  94%  Weight: 93 kg (205 lb) 91.6 kg (202 lb)    Height: 6' (1.829 m) 6' (1.829 m)      Wt Readings from Last 3 Encounters:  11/01/16 91.6 kg (202 lb)  03/27/15 99.8 kg (220 lb)  03/22/15 99.8 kg (220 lb)     Intake/Output Summary (Last 24 hours) at 11/01/16 1021 Last data filed at 11/01/16 0600  Gross per 24 hour  Intake                0 ml  Output              650 ml  Net             -650 ml     Physical Exam  Awake Alert, Oriented X 3, No new F.N deficits, Normal affect Javier Berry,PERRAL Supple Neck,No JVD, No cervical lymphadenopathy appriciated.  Symmetrical Chest wall movement, Good air movement bilaterally, CTAB RRR,No Gallops,Rubs or new Murmurs, No Parasternal Heave Hypoactive B.Sounds, NG in place, Abd Soft, No tenderness, No organomegaly appriciated, No rebound - guarding or rigidity. No Cyanosis, Clubbing or edema, No new Rash or bruise       Data Review:    CBC  Recent Labs Lab 10/31/16 2036 11/01/16 0632  WBC 12.3* 11.1*  HGB 17.6* 15.6  HCT 50.1 44.1  PLT 196 177  MCV 91.4 91.5  MCH 32.1 32.4  MCHC 35.1 35.4  RDW 13.7 13.8     Chemistries   Recent Labs Lab 10/31/16 2036 11/01/16 0632  NA 139 140  K 4.8 4.2  CL 102 106  CO2 26 26  GLUCOSE 152* 122*  BUN 10 9  CREATININE 1.38* 1.33*  CALCIUM 10.1 9.2  AST 33 25  ALT 22 19  ALKPHOS 42 34*  BILITOT 2.7* 2.3*   ------------------------------------------------------------------------------------------------------------------ No results for input(s): CHOL, HDL, LDLCALC, TRIG, CHOLHDL, LDLDIRECT in the last 72 hours.  No results found for: HGBA1C ------------------------------------------------------------------------------------------------------------------ No results for input(s): TSH, T4TOTAL, T3FREE, THYROIDAB in the last 72 hours.  Invalid input(s): FREET3 ------------------------------------------------------------------------------------------------------------------ No results for input(s): VITAMINB12, FOLATE, FERRITIN, TIBC, IRON, RETICCTPCT in the last 72 hours.  Coagulation profile No results for input(s): INR, PROTIME in the last 168 hours.  No results for input(s): DDIMER in the last 72 hours.  Cardiac Enzymes No results for input(s): CKMB, TROPONINI, MYOGLOBIN in the last 168 hours.  Invalid input(s): CK ------------------------------------------------------------------------------------------------------------------    Component Value Date/Time   BNP 378.1 (H) 03/22/2015 1646    Micro Results No results found for this or any previous visit (from the past 240 hour(s)).  Radiology Reports Ct Abdomen Pelvis W Contrast  Result Date: 11/01/2016 CLINICAL DATA:  Periumbilical pain with episodes  of emesis for 2 days. EXAM: CT ABDOMEN AND PELVIS WITH CONTRAST TECHNIQUE: Multidetector CT imaging of the abdomen and pelvis was performed using the standard protocol following bolus administration of intravenous contrast. CONTRAST:  ISOVUE-300 IOPAMIDOL (ISOVUE-300) INJECTION 61% COMPARISON:  None. FINDINGS: Lower chest: No acute  abnormality. Hepatobiliary: No focal liver abnormality is seen. No gallstones, gallbladder wall thickening, or biliary dilatation. Pancreas: Unremarkable. No pancreatic ductal dilatation or surrounding inflammatory changes. Spleen: Normal in size without focal abnormality. Adrenals/Urinary Tract: Adrenal glands are unremarkable. Kidneys are normal, without renal calculi, focal lesion, or hydronephrosis. Bladder is unremarkable. Stomach/Bowel: Dilatation of the stomach and small bowel with abrupt transition to decompressed ileum. At the transition point in the right lower quadrant, no mass or hernia is evident. Colon is decompressed. Appendix is normal. Vascular/Lymphatic: The abdominal aorta is normal in caliber with mild atherosclerotic calcification. Reproductive: Unremarkable Other: Small volume ascites, perihepatic and in the dependent pelvis. Musculoskeletal: No significant skeletal lesion. IMPRESSION: Dilatation of small bowel with caliber transition in the ileum, consistent with a small bowel obstruction. No mass or hernia is evident at the transition point. Small volume ascites. Electronically Signed   By: Ellery Plunk M.D.   On: 11/01/2016 00:00   Dg Abd Portable 1 View  Result Date: 11/01/2016 CLINICAL DATA:  Nasogastric tube placement. Follow-up small bowel obstruction. EXAM: PORTABLE ABDOMEN - 1 VIEW COMPARISON:  CT abdomen and pelvis October 31, 2016 FINDINGS: Nasogastric tube tip and side-port project in proximal stomach. Multiple loops of gas distended small bowel measuring to 4.6 cm. No intra-abdominal mass effect or pathologic calcifications. IMPRESSION: Nasogastric tube tip projects in proximal stomach. Electronically Signed   By: Awilda Metro M.D.   On: 11/01/2016 03:03    Time Spent in minutes  30   Chaselyn Nanney K M.D on 11/01/2016 at 10:21 AM  Between 7am to 7pm - Pager - [phone]  After 7pm go to [email or web address] - password [facility]  [facility] -  Office   [phone]    '

## 2016-11-01 NOTE — Consult Note (Signed)
Reason for Consult: SBO Referring Physician: Dr Astrid Divine is an 56 y.o. male.  HPI: Pt presented to ED last night with abdominal pain.  States he hasn't had a BM in 3 days, no flatus either.  He tried an enema but this didn't help much.  He developed nausea and vomiting yesterday.  He has never had an abdominal surgeries in the past.  He has a h/o CAD and Afib and is on Eliquis.    Past Medical History:  Diagnosis Date  . Atrial fibrillation ([place])   . Essential hypertension   . Gout   . PUD (peptic ulcer disease)     Past Surgical History:  Procedure Laterality Date  . CARDIOVERSION N/A 12/20/2013   Procedure: CARDIOVERSION;  Surgeon: Lelon Perla, MD;  Location: [facility] ENDOSCOPY;  Service: Cardiovascular;  Laterality: N/A;  . TOE SURGERY      Family History  Problem Relation Age of Onset  . Kidney disease Mother   . Diabetes Mother   . Hypertension Father   . Heart disease Father     CHF  . Kidney disease Sister   . Stroke Sister   . Drug abuse Brother   . Heart disease Brother     Murmur  . Colon cancer Neg Hx     Social History:  reports that he has never smoked. He has never used smokeless tobacco. He reports that he does not drink alcohol or use drugs.  Allergies: No Known Allergies  Medications: I have reviewed the patient's current medications.  Results for orders placed or performed during the hospital encounter of 10/31/16 (from the past 48 hour(s))  Lipase, blood     Status: None   Collection Time: 10/31/16  8:36 PM  Result Value Ref Range   Lipase 14 11 - 51 U/L  Comprehensive metabolic panel     Status: Abnormal   Collection Time: 10/31/16  8:36 PM  Result Value Ref Range   Sodium 139 135 - 145 mmol/L   Potassium 4.8 3.5 - 5.1 mmol/L   Chloride 102 101 - 111 mmol/L   CO2 26 22 - 32 mmol/L   Glucose, Bld 152 (H) 65 - 99 mg/dL   BUN 10 6 - 20 mg/dL   Creatinine, Ser 1.38 (H) 0.61 - 1.24 mg/dL   Calcium 10.1 8.9 - 10.3 mg/dL   Total  Protein 6.8 6.5 - 8.1 g/dL   Albumin 3.9 3.5 - 5.0 g/dL   AST 33 15 - 41 U/L   ALT 22 17 - 63 U/L   Alkaline Phosphatase 42 38 - 126 U/L   Total Bilirubin 2.7 (H) 0.3 - 1.2 mg/dL   GFR calc non Af Amer 55 (L) >60 mL/min   GFR calc Af Amer >60 >60 mL/min    Comment: (NOTE) The eGFR has been calculated using the CKD EPI equation. This calculation has not been validated in all clinical situations. eGFR's persistently <60 mL/min signify possible Chronic Kidney Disease.    Anion gap 11 5 - 15  CBC     Status: Abnormal   Collection Time: 10/31/16  8:36 PM  Result Value Ref Range   WBC 12.3 (H) 4.0 - 10.5 K/uL   RBC 5.48 4.22 - 5.81 MIL/uL   Hemoglobin 17.6 (H) 13.0 - 17.0 g/dL   HCT 50.1 39.0 - 52.0 %   MCV 91.4 78.0 - 100.0 fL   MCH 32.1 26.0 - 34.0 pg   MCHC 35.1 30.0 - 36.0  g/dL   RDW 13.7 11.5 - 15.5 %   Platelets 196 150 - 400 K/uL  Urinalysis, Routine w reflex microscopic     Status: Abnormal   Collection Time: 10/31/16  8:40 PM  Result Value Ref Range   Color, Urine YELLOW YELLOW   APPearance CLEAR CLEAR   Specific Gravity, Urine 1.021 1.005 - 1.030   pH 5.0 5.0 - 8.0   Glucose, UA NEGATIVE NEGATIVE mg/dL   Hgb urine dipstick MODERATE (A) NEGATIVE   Bilirubin Urine NEGATIVE NEGATIVE   Ketones, ur NEGATIVE NEGATIVE mg/dL   Protein, ur 30 (A) NEGATIVE mg/dL   Nitrite NEGATIVE NEGATIVE   Leukocytes, UA NEGATIVE NEGATIVE   RBC / HPF 6-30 0 - 5 RBC/hpf   WBC, UA 0-5 0 - 5 WBC/hpf   Bacteria, UA NONE SEEN NONE SEEN   Squamous Epithelial / LPF 0-5 (A) NONE SEEN   Mucous PRESENT   Comprehensive metabolic panel     Status: Abnormal   Collection Time: 11/01/16  6:32 AM  Result Value Ref Range   Sodium 140 135 - 145 mmol/L   Potassium 4.2 3.5 - 5.1 mmol/L   Chloride 106 101 - 111 mmol/L   CO2 26 22 - 32 mmol/L   Glucose, Bld 122 (H) 65 - 99 mg/dL   BUN 9 6 - 20 mg/dL   Creatinine, Ser 1.33 (H) 0.61 - 1.24 mg/dL   Calcium 9.2 8.9 - 10.3 mg/dL   Total Protein 5.7 (L) 6.5  - 8.1 g/dL   Albumin 3.1 (L) 3.5 - 5.0 g/dL   AST 25 15 - 41 U/L   ALT 19 17 - 63 U/L   Alkaline Phosphatase 34 (L) 38 - 126 U/L   Total Bilirubin 2.3 (H) 0.3 - 1.2 mg/dL   GFR calc non Af Amer 58 (L) >60 mL/min   GFR calc Af Amer >60 >60 mL/min    Comment: (NOTE) The eGFR has been calculated using the CKD EPI equation. This calculation has not been validated in all clinical situations. eGFR's persistently <60 mL/min signify possible Chronic Kidney Disease.    Anion gap 8 5 - 15  CBC     Status: Abnormal   Collection Time: 11/01/16  6:32 AM  Result Value Ref Range   WBC 11.1 (H) 4.0 - 10.5 K/uL   RBC 4.82 4.22 - 5.81 MIL/uL   Hemoglobin 15.6 13.0 - 17.0 g/dL   HCT 44.1 39.0 - 52.0 %   MCV 91.5 78.0 - 100.0 fL   MCH 32.4 26.0 - 34.0 pg   MCHC 35.4 30.0 - 36.0 g/dL   RDW 13.8 11.5 - 15.5 %   Platelets 177 150 - 400 K/uL  Lactate dehydrogenase     Status: None   Collection Time: 11/01/16  6:32 AM  Result Value Ref Range   LDH 184 98 - 192 U/L    Ct Abdomen Pelvis W Contrast  Result Date: 11/01/2016 CLINICAL DATA:  Periumbilical pain with episodes of emesis for 2 days. EXAM: CT ABDOMEN AND PELVIS WITH CONTRAST TECHNIQUE: Multidetector CT imaging of the abdomen and pelvis was performed using the standard protocol following bolus administration of intravenous contrast. CONTRAST:  150m ISOVUE-300 IOPAMIDOL (ISOVUE-300) INJECTION 61% COMPARISON:  None. FINDINGS: Lower chest: No acute abnormality. Hepatobiliary: No focal liver abnormality is seen. No gallstones, gallbladder wall thickening, or biliary dilatation. Pancreas: Unremarkable. No pancreatic ductal dilatation or surrounding inflammatory changes. Spleen: Normal in size without focal abnormality. Adrenals/Urinary Tract: Adrenal glands are unremarkable. Kidneys  are normal, without renal calculi, focal lesion, or hydronephrosis. Bladder is unremarkable. Stomach/Bowel: Dilatation of the stomach and small bowel with abrupt transition to  decompressed ileum. At the transition point in the right lower quadrant, no mass or hernia is evident. Colon is decompressed. Appendix is normal. Vascular/Lymphatic: The abdominal aorta is normal in caliber with mild atherosclerotic calcification. Reproductive: Unremarkable Other: Small volume ascites, perihepatic and in the dependent pelvis. Musculoskeletal: No significant skeletal lesion. IMPRESSION: Dilatation of small bowel with caliber transition in the ileum, consistent with a small bowel obstruction. No mass or hernia is evident at the transition point. Small volume ascites. Electronically Signed   By: Andreas Newport M.D.   On: 11/01/2016 00:00   Dg Abd Portable 1 View  Result Date: 11/01/2016 CLINICAL DATA:  Nasogastric tube placement. Follow-up small bowel obstruction. EXAM: PORTABLE ABDOMEN - 1 VIEW COMPARISON:  CT abdomen and pelvis October 31, 2016 FINDINGS: Nasogastric tube tip and side-port project in proximal stomach. Multiple loops of gas distended small bowel measuring to 4.6 cm. No intra-abdominal mass effect or pathologic calcifications. IMPRESSION: Nasogastric tube tip projects in proximal stomach. Electronically Signed   By: Elon Alas M.D.   On: 11/01/2016 03:03    Review of Systems  Constitutional: Negative for chills and fever.  Eyes: Negative for blurred vision and double vision.  Respiratory: Negative for cough and shortness of breath.   Cardiovascular: Negative for chest pain and palpitations.  Gastrointestinal: Positive for abdominal pain, constipation, nausea and vomiting.  Genitourinary: Negative for dysuria, frequency and urgency.  Musculoskeletal: Negative for back pain, myalgias and neck pain.  Skin: Negative for itching and rash.  Neurological: Negative for dizziness.   Blood pressure (!) 152/111, pulse 83, temperature 98.2 F (36.8 C), temperature source Oral, resp. rate 18, height 6' (1.829 m), weight 91.6 kg (202 lb), SpO2 94 %. Physical Exam   Constitutional: He is oriented to person, place, and time. He appears well-developed and well-nourished. No distress.  HENT:  Head: Normocephalic and atraumatic.  Eyes: Conjunctivae and EOM are normal. Pupils are equal, round, and reactive to light.  Neck: Normal range of motion. Neck supple.  Cardiovascular: Normal rate.   Respiratory: Effort normal and breath sounds normal. No respiratory distress.  GI: Soft. He exhibits distension. There is no tenderness. There is no rebound and no guarding.  Musculoskeletal: Normal range of motion.  Neurological: He is alert and oriented to person, place, and time.  Skin: Skin is warm and dry. He is not diaphoretic.    Assessment/Plan: Agree with holding Elliquis.  Will start SBO protocol.  Will order CMET in AM to re-eval hyperbilirubinemia.  If still elevated, may need further workup for this.  Allaina Brotzman C. 03/30/6202, 5:59 AM

## 2016-11-01 NOTE — Plan of Care (Signed)
After 10mg  Cardizem push (0815), pat still aysymptomatic, but still HR of 100-120s.  Dr. Informed.

## 2016-11-01 NOTE — Plan of Care (Signed)
Patient HR remaining low 100 - 130s as baseline after 2nd dose 10 mg cardizem push. Patient remains aysmptomatic.  Dr. Informed.

## 2016-11-01 NOTE — Plan of Care (Signed)
Pt current HR 110- low130s. 10mg  Cardizem push completed. Asymptomatic. Dr. Eather Colas.Wil;continue to monitor

## 2016-11-01 NOTE — Progress Notes (Signed)
ANTICOAGULATION CONSULT NOTE - Initial Consult  Pharmacy Consult for Lovenox Indication: atrial fibrillation  No Known Allergies  Patient Measurements: Height: 6' (182.9 cm) Weight: 202 lb (91.6 kg) IBW/kg (Calculated) : 77.6  Vital Signs: Temp: 97.6 F (36.4 C) (02/03 0213) Temp Source: Oral (02/03 0213) BP: 142/119 (02/03 0213) Pulse Rate: 66 (02/03 0213)  Labs:  Recent Labs  10/31/16 2036  HGB 17.6*  HCT 50.1  PLT 196  CREATININE 1.38*    Estimated Creatinine Clearance: 64.8 mL/min (by C-G formula based on SCr of 1.38 mg/dL (H)).   Medical History: Past Medical History:  Diagnosis Date  . Atrial fibrillation ([facility])   . Essential hypertension   . Gout   . PUD (peptic ulcer disease)     Medications:  Prescriptions Prior to Admission  Medication Sig Dispense Refill Last Dose  . albuterol (PROVENTIL HFA;VENTOLIN HFA) 108 (90 Base) MCG/ACT inhaler Inhale 1-2 puffs into the lungs every 6 (six) hours as needed for wheezing or shortness of breath.   unk  . allopurinol (ZYLOPRIM) 100 MG tablet Take 100 mg by mouth daily as needed (gout attacks).    unk  . ELIQUIS 5 MG TABS tablet TAKE 1 TABLET BY MOUTH TWICE DAILY 60 tablet 6 Past Week at Unknown time  . furosemide (LASIX) 40 MG tablet Take 1 tablet (40 mg total) by mouth daily. Need appointment before anymore refills 30 tablet 0 Past Week at Unknown time  . HYDROcodone-acetaminophen (NORCO) 7.5-325 MG per tablet Take 1 tablet by mouth every 4 (four) hours as needed. (Patient taking differently: Take 1 tablet by mouth every 4 (four) hours as needed (pain). ) 20 tablet 0 unk  . isosorbide mononitrate (IMDUR) 30 MG 24 hr tablet TAKE 1 TABLET (30 MG TOTAL) BY MOUTH DAILY FOR HEART 90 tablet 3 Past Week at Unknown time  . losartan (COZAAR) 100 MG tablet Take 1 tablet (100 mg total) by mouth daily. 8 tablet 0 Past Week at Unknown time  . metoprolol succinate (TOPROL-XL) 100 MG 24 hr tablet Take 1 tablet (100 mg total) by mouth  daily. Take with or immediately following a meal. 90 tablet 3 Past Week at unk  . ranitidine (ZANTAC) 150 MG capsule Take once or twice a day as needed for your stomach (Patient taking differently: Take 150 mg by mouth daily as needed (stomach pain/ulcers). Take once or twice a day as needed for your stomach) 60 capsule 3 unk  . spironolactone (ALDACTONE) 25 MG tablet Take 1 tablet (25 mg total) by mouth daily. <PLEASE MAKE APPOINTMENT FOR REFILLS> 15 tablet 0 Past Week at Unknown time    Assessment: 56 y.o. male admitted with abdominal pain/SBO, h/o Afib and Eliquis on hold, for Lovenox  Goal of Therapy:  Full anticoagulation with Lovenox Monitor platelets by anticoagulation protocol: Yes   Plan:  Lovenox 90 mg SQ q12h  Izyk Marty, Gary Fleet 11/01/2016,2:37 AM

## 2016-11-01 NOTE — Plan of Care (Signed)
Patient continues to run afib 110-low 140s.  Dr. Informed. Asymptomatic.

## 2016-11-01 NOTE — Progress Notes (Signed)
Notified Javier Lynch NP re patient's heart rate 130-140 with 4 beats of Vtach folowed by 7 beats of Vtach, new order received.

## 2016-11-01 NOTE — Plan of Care (Signed)
HR running 115-145. Patient still asymptomatic.  RN will continue to monitor and give PRN meds as needed.

## 2016-11-01 NOTE — Plan of Care (Signed)
NG hooked back to LIS

## 2016-11-02 ENCOUNTER — Inpatient Hospital Stay (HOSPITAL_COMMUNITY): Payer: BC Managed Care – PPO

## 2016-11-02 LAB — COMPREHENSIVE METABOLIC PANEL
ALK PHOS: 31 U/L — AB (ref 38–126)
ALT: 16 U/L — ABNORMAL LOW (ref 17–63)
ANION GAP: 7 (ref 5–15)
AST: 24 U/L (ref 15–41)
Albumin: 3.1 g/dL — ABNORMAL LOW (ref 3.5–5.0)
BILIRUBIN TOTAL: 2.3 mg/dL — AB (ref 0.3–1.2)
BUN: 17 mg/dL (ref 6–20)
CALCIUM: 9 mg/dL (ref 8.9–10.3)
CO2: 26 mmol/L (ref 22–32)
Chloride: 107 mmol/L (ref 101–111)
Creatinine, Ser: 1.38 mg/dL — ABNORMAL HIGH (ref 0.61–1.24)
GFR calc Af Amer: >60 mL/min (ref >60)
GFR, EST NON AFRICAN AMERICAN: 55 mL/min — AB (ref >60)
Glucose, Bld: 105 mg/dL — ABNORMAL HIGH (ref 65–99)
POTASSIUM: 4.5 mmol/L (ref 3.5–5.1)
Sodium: 140 mmol/L (ref 135–145)
TOTAL PROTEIN: 5.4 g/dL — AB (ref 6.5–8.1)

## 2016-11-02 LAB — CBC
HCT: 46.4 % (ref 39.0–52.0)
Hemoglobin: 15.9 g/dL (ref 13.0–17.0)
MCH: 31.8 pg (ref 26.0–34.0)
MCHC: 34.3 g/dL (ref 30.0–36.0)
MCV: 92.8 fL (ref 78.0–100.0)
PLATELETS: 176 10*3/uL (ref 150–400)
RBC: 5 MIL/uL (ref 4.22–5.81)
RDW: 14.1 % (ref 11.5–15.5)
WBC: 9.7 10*3/uL (ref 4.0–10.5)

## 2016-11-02 LAB — MRSA PCR SCREENING: MRSA BY PCR: NEGATIVE

## 2016-11-02 LAB — MAGNESIUM: Magnesium: 1.8 mg/dL (ref 1.7–2.4)

## 2016-11-02 MED ORDER — METOPROLOL TARTRATE 5 MG/5ML IV SOLN
5.0000 mg | Freq: Once | INTRAVENOUS | Status: AC
  Administered 2016-11-02: 5 mg via INTRAVENOUS

## 2016-11-02 MED ORDER — DILTIAZEM HCL 25 MG/5ML IV SOLN
10.0000 mg | Freq: Four times a day (QID) | INTRAVENOUS | Status: DC | PRN
  Administered 2016-11-03: 10 mg via INTRAVENOUS
  Filled 2016-11-02 (×2): qty 5

## 2016-11-02 MED ORDER — DILTIAZEM HCL 25 MG/5ML IV SOLN
10.0000 mg | Freq: Once | INTRAVENOUS | Status: AC
  Administered 2016-11-02: 10 mg via INTRAVENOUS
  Filled 2016-11-02: qty 5

## 2016-11-02 MED ORDER — DILTIAZEM HCL 100 MG IV SOLR
5.0000 mg/h | INTRAVENOUS | Status: DC
  Administered 2016-11-02: 15 mg/h via INTRAVENOUS
  Administered 2016-11-02: 10 mg/h via INTRAVENOUS
  Administered 2016-11-02: 15 mg/h via INTRAVENOUS
  Administered 2016-11-02: 5 mg/h via INTRAVENOUS
  Administered 2016-11-02 – 2016-11-03 (×2): 15 mg/h via INTRAVENOUS
  Administered 2016-11-04: 10 mg/h via INTRAVENOUS
  Administered 2016-11-05: 5 mg/h via INTRAVENOUS
  Filled 2016-11-02 (×11): qty 100

## 2016-11-02 MED ORDER — DILTIAZEM HCL 100 MG IV SOLR: 5.0000 mg/h | INTRAVENOUS | Status: DC

## 2016-11-02 NOTE — Progress Notes (Signed)
Subjective: Patient reports abdominal pain much less May have passed a small amount of flatus SBO protocol not yet initiated  Objective: Vital signs in last 24 hours: Temp:  [97.6 F (36.4 C)-98.6 F (37 C)] 98.6 F (37 C) (02/04 0700) Pulse Rate:  [53-108] 106 (02/04 0700) Resp:  [17-18] 17 (02/04 0438) BP: (122-160)/(76-114) 131/82 (02/04 0700) SpO2:  [93 %-97 %] 95 % (02/04 0800) Weight:  [92.4 kg (203 lb 12.8 oz)] 92.4 kg (203 lb 12.8 oz) (02/04 0438) Last BM Date: 10/29/16  Intake/Output from previous day: 02/03 0701 - 02/04 0700 In: 1976.7 [I.V.:1976.7] Out: 1075 [Urine:525; Emesis/NG output:550] Intake/Output this shift: Total I/O In: 10 [I.V.:10] Out: -   Exam: Abdomen soft, non distended, minimally tender  Lab Results:   Recent Labs  11/01/16 0632 11/02/16 0407  WBC 11.1* 9.7  HGB 15.6 15.9  HCT 44.1 46.4  PLT 177 176   BMET  Recent Labs  11/01/16 0632 11/02/16 0407  NA 140 140  K 4.2 4.5  CL 106 107  CO2 26 26  GLUCOSE 122* 105*  BUN 9 17  CREATININE 1.33* 1.38*  CALCIUM 9.2 9.0   PT/INR No results for input(s): LABPROT, INR in the last 72 hours. ABG No results for input(s): PHART, HCO3 in the last 72 hours.  Invalid input(s): PCO2, PO2  Studies/Results: Ct Abdomen Pelvis W Contrast  Result Date: 11/01/2016 CLINICAL DATA:  Periumbilical pain with episodes of emesis for 2 days. EXAM: CT ABDOMEN AND PELVIS WITH CONTRAST TECHNIQUE: Multidetector CT imaging of the abdomen and pelvis was performed using the standard protocol following bolus administration of intravenous contrast. CONTRAST:  ISOVUE-300 IOPAMIDOL (ISOVUE-300) INJECTION 61% COMPARISON:  None. FINDINGS: Lower chest: No acute abnormality. Hepatobiliary: No focal liver abnormality is seen. No gallstones, gallbladder wall thickening, or biliary dilatation. Pancreas: Unremarkable. No pancreatic ductal dilatation or surrounding inflammatory changes. Spleen: Normal in size without  focal abnormality. Adrenals/Urinary Tract: Adrenal glands are unremarkable. Kidneys are normal, without renal calculi, focal lesion, or hydronephrosis. Bladder is unremarkable. Stomach/Bowel: Dilatation of the stomach and small bowel with abrupt transition to decompressed ileum. At the transition point in the right lower quadrant, no mass or hernia is evident. Colon is decompressed. Appendix is normal. Vascular/Lymphatic: The abdominal aorta is normal in caliber with mild atherosclerotic calcification. Reproductive: Unremarkable Other: Small volume ascites, perihepatic and in the dependent pelvis. Musculoskeletal: No significant skeletal lesion. IMPRESSION: Dilatation of small bowel with caliber transition in the ileum, consistent with a small bowel obstruction. No mass or hernia is evident at the transition point. Small volume ascites. Electronically Signed   By: Ellery Plunk M.D.   On: 11/01/2016 00:00   Dg Abd Portable 1v-small Bowel Obstruction Protocol-initial, 8 Hr Delay  Result Date: 11/01/2016 CLINICAL DATA:  Small-bowel obstruction, 8 hour delay EXAM: PORTABLE ABDOMEN - 1 VIEW COMPARISON:  11/01/2016 FINDINGS: Persistent small bowel dilatation is identified. The overall appearance is similar to that seen on the prior exam. No definitive free air is noted. Nasogastric catheter remains in the stomach. No significant contrast material is noted. Degenerative changes of the lumbar spine are noted. IMPRESSION: Nasogastric catheter within the stomach. Changes consistent with small bowel obstruction stable from the prior exam. Electronically Signed   By: Alcide Clever M.D.   On: 11/01/2016 20:54   Dg Abd Portable 1 View  Result Date: 11/01/2016 CLINICAL DATA:  Nasogastric tube placement. Follow-up small bowel obstruction. EXAM: PORTABLE ABDOMEN - 1 VIEW COMPARISON:  CT abdomen and pelvis October 31, 2016 FINDINGS: Nasogastric tube tip and side-port project in proximal stomach. Multiple loops of gas  distended small bowel measuring to 4.6 cm. No intra-abdominal mass effect or pathologic calcifications. IMPRESSION: Nasogastric tube tip projects in proximal stomach. Electronically Signed   By: Awilda Metro M.D.   On: 11/01/2016 03:03    Anti-infectives: Anti-infectives    None      Assessment/Plan:  Small bowel obstruction in patient with no prior abdominal surgery. Small bowel protocol ordered Continue NPO and NG  LOS: 1 day    Leiann Sporer A 11/02/2016

## 2016-11-02 NOTE — Progress Notes (Signed)
PROGRESS NOTE                                                                                                                                                                                                             Patient Demographics:    Javier Berry, is a 56 y.o. male, DOB - 1961-05-18, WUJ:[id number]  Admit date - 10/31/2016   Admitting Physician Inez Catalina, MD  Outpatient Primary MD for the patient is [facility], MD  LOS - 1  Chief Complaint  Patient presents with  . Constipation  . Abdominal Pain  . Emesis       Brief Narrative   Javier Berry is a 56 y.o. male with medical history significant of Atrial fibrillation, HTN, PUD who presents with abdominal pain, nausea and vomiting for 3 days, he was diagnosed with small bowel obstruction admitted on 11/01/2016.   Subjective:    [name] today has, No headache, No chest pain, No abdominal pain - mild Nausea, No new weakness tingling or numbness, No Cough - SOB.    Assessment  & Plan :     1. SBO obstruction. No previous abdominal surgeries, ever had surveillance colonoscopy. Currently has NG tube, nothing by mouth, IV fluids, pain-free with mild nausea, surgery consulted and following.  2. Chronic A. fib currently in RVR. [place] vasc 2 score of 2 -  Eliquis on hold, is being placed on scheduled Lopressor and Cardizem IV, monitor on telemetry, if rate not well controlled will place on Cardizem drip. Follows with Dr. Jens Som.  3. 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 Currently compensated. IV fluids with caution and monitor.  4. Hypertension. Lopressor and Cardizem as above and monitor.    Family Communication  :  None  Code Status :  Full  Diet : Diet NPO time specified   Disposition Plan  :  Tele  Consults  :  CCS  Procedures  :  CT scan abdomen and pelvis showing small bowel obstruction with transition point around the  ileum.  DVT Prophylaxis  :  Lovenox    Lab Results  Component Value Date   PLT 176 11/02/2016    Inpatient Medications  Scheduled Meds: . enoxaparin (LOVENOX) injection  90 mg Subcutaneous BID  . mouth rinse  15 mL Mouth Rinse BID  . metoprolol  5 mg Intravenous Q8H  . nitroGLYCERIN  0.5 inch Topical Q6H  . sodium chloride flush  3 mL Intravenous Q12H   Continuous Infusions: . 0.9 % NaCl with KCl 40 mEq / L 50 mL/hr (11/02/16 1601)  . diltiazem (CARDIZEM) infusion 15 mg/hr (11/02/16 1110)   PRN Meds:.albuterol, diltiazem, hydrALAZINE, morphine injection, ondansetron (ZOFRAN) IV  Antibiotics  :    Anti-infectives    None         Objective:   Vitals:   11/02/16 0930 11/02/16 1000 11/02/16 1030 11/02/16 1100  BP: (!) 139/116 127/78 128/84 115/82  Pulse: 89 (!) 161 69 94  Resp:      Temp:      TempSrc:      SpO2: 96% 95% (!) 89% 95%  Weight:      Height:        Wt Readings from Last 3 Encounters:  11/02/16 92.4 kg (203 lb 12.8 oz)  03/27/15 99.8 kg (220 lb)  03/22/15 99.8 kg (220 lb)     Intake/Output Summary (Last 24 hours) at 11/02/16 1137 Last data filed at 11/02/16 1100  Gross per 24 hour  Intake          1281.67 ml  Output             1275 ml  Net             6.67 ml     Physical Exam  Awake Alert, Oriented X 3, No new F.N deficits, Normal affect Elmer.AT,PERRAL Supple Neck,No JVD, No cervical lymphadenopathy appriciated.  Symmetrical Chest wall movement, Good air movement bilaterally, CTAB RRR,No Gallops,Rubs or new Murmurs, No Parasternal Heave Hypoactive B.Sounds, NG in place, Abd Soft, No tenderness, No organomegaly appriciated, No rebound - guarding or rigidity. No Cyanosis, Clubbing or edema, No new Rash or bruise       Data Review:    CBC  Recent Labs Lab 10/31/16 2036 11/01/16 0632 11/02/16 0407  WBC 12.3* 11.1* 9.7  HGB 17.6* 15.6 15.9  HCT 50.1 44.1 46.4  PLT 196 177 176  MCV 91.4 91.5 92.8  MCH 32.1 32.4 31.8  MCHC 35.1  35.4 34.3  RDW 13.7 13.8 14.1    Chemistries   Recent Labs Lab 10/31/16 2036 11/01/16 0632 11/02/16 0407  NA 139 140 140  K 4.8 4.2 4.5  CL 102 106 107  CO2 26 26 26   GLUCOSE 152* 122* 105*  BUN 10 9 17   CREATININE 1.38* 1.33* 1.38*  CALCIUM 10.1 9.2 9.0  MG  --   --  1.8  AST 33 25 24  ALT 22 19 16*  ALKPHOS 42 34* 31*  BILITOT 2.7* 2.3* 2.3*   ------------------------------------------------------------------------------------------------------------------ No results for input(s): CHOL, HDL, LDLCALC, TRIG, CHOLHDL, LDLDIRECT in the last 72 hours.  No results found for: HGBA1C ------------------------------------------------------------------------------------------------------------------ No results for input(s): TSH, T4TOTAL, T3FREE, THYROIDAB in the last 72 hours.  Invalid input(s): FREET3 ------------------------------------------------------------------------------------------------------------------ No results for input(s): VITAMINB12, FOLATE, FERRITIN, TIBC, IRON, RETICCTPCT in the last 72 hours.  Coagulation profile No results for input(s): INR, PROTIME in the last 168 hours.  No results for input(s): DDIMER in the last 72 hours.  Cardiac Enzymes No results for input(s): CKMB, TROPONINI, MYOGLOBIN in the last 168 hours.  Invalid input(s): CK ------------------------------------------------------------------------------------------------------------------    Component Value Date/Time   BNP 378.1 (H) 03/22/2015 1646    Micro Results Recent Results (from the past 240 hour(s))  MRSA PCR Screening     Status: None  Collection Time: 11/02/16  5:15 AM  Result Value Ref Range Status   MRSA by PCR NEGATIVE NEGATIVE Final    Comment:        The GeneXpert MRSA Assay (FDA approved for NASAL specimens only), is one component of a comprehensive MRSA colonization surveillance program. It is not intended to diagnose MRSA infection nor to guide or monitor  treatment for MRSA infections.     Radiology Reports Ct Abdomen Pelvis W Contrast  Result Date: 11/01/2016 CLINICAL DATA:  Periumbilical pain with episodes of emesis for 2 days. EXAM: CT ABDOMEN AND PELVIS WITH CONTRAST TECHNIQUE: Multidetector CT imaging of the abdomen and pelvis was performed using the standard protocol following bolus administration of intravenous contrast. CONTRAST:  ISOVUE-300 IOPAMIDOL (ISOVUE-300) INJECTION 61% COMPARISON:  None. FINDINGS: Lower chest: No acute abnormality. Hepatobiliary: No focal liver abnormality is seen. No gallstones, gallbladder wall thickening, or biliary dilatation. Pancreas: Unremarkable. No pancreatic ductal dilatation or surrounding inflammatory changes. Spleen: Normal in size without focal abnormality. Adrenals/Urinary Tract: Adrenal glands are unremarkable. Kidneys are normal, without renal calculi, focal lesion, or hydronephrosis. Bladder is unremarkable. Stomach/Bowel: Dilatation of the stomach and small bowel with abrupt transition to decompressed ileum. At the transition point in the right lower quadrant, no mass or hernia is evident. Colon is decompressed. Appendix is normal. Vascular/Lymphatic: The abdominal aorta is normal in caliber with mild atherosclerotic calcification. Reproductive: Unremarkable Other: Small volume ascites, perihepatic and in the dependent pelvis. Musculoskeletal: No significant skeletal lesion. IMPRESSION: Dilatation of small bowel with caliber transition in the ileum, consistent with a small bowel obstruction. No mass or hernia is evident at the transition point. Small volume ascites. Electronically Signed   By: Ellery Plunk M.D.   On: 11/01/2016 00:00   Dg Abd Portable 1v  Result Date: 11/02/2016 CLINICAL DATA:  Small bowel obstruction. EXAM: PORTABLE ABDOMEN - 1 VIEW COMPARISON:  Abdominal plain film dated 11/01/2016. FINDINGS: Persistent small bowel dilatation within the upper abdomen and central abdomen. Air  and stool noted within the large bowel. No evidence of soft tissue mass or abnormal fluid collection. No evidence of free intraperitoneal air seen. Mild degenerative change within the lower lumbar spine. No acute or suspicious osseous finding. IMPRESSION: No significant interval change. Distended air-filled small bowel loops within the upper abdomen and central abdomen, compatible with given history of small bowel obstruction. Electronically Signed   By: Bary Richard M.D.   On: 11/02/2016 09:51   Dg Abd Portable 1v-small Bowel Obstruction Protocol-initial, 8 Hr Delay  Result Date: 11/01/2016 CLINICAL DATA:  Small-bowel obstruction, 8 hour delay EXAM: PORTABLE ABDOMEN - 1 VIEW COMPARISON:  11/01/2016 FINDINGS: Persistent small bowel dilatation is identified. The overall appearance is similar to that seen on the prior exam. No definitive free air is noted. Nasogastric catheter remains in the stomach. No significant contrast material is noted. Degenerative changes of the lumbar spine are noted. IMPRESSION: Nasogastric catheter within the stomach. Changes consistent with small bowel obstruction stable from the prior exam. Electronically Signed   By: Alcide Clever M.D.   On: 11/01/2016 20:54   Dg Abd Portable 1 View  Result Date: 11/01/2016 CLINICAL DATA:  Nasogastric tube placement. Follow-up small bowel obstruction. EXAM: PORTABLE ABDOMEN - 1 VIEW COMPARISON:  CT abdomen and pelvis October 31, 2016 FINDINGS: Nasogastric tube tip and side-port project in proximal stomach. Multiple loops of gas distended small bowel measuring to 4.6 cm. No intra-abdominal mass effect or pathologic calcifications. IMPRESSION: Nasogastric tube tip projects in proximal  stomach. Electronically Signed   By: Awilda Metro M.D.   On: 11/01/2016 03:03    Time Spent in minutes  30   Johara Lodwick K M.D on 11/02/2016 at 11:37 AM  Between 7am to 7pm - Pager - [phone]  After 7pm go to [email or web address] - password [facility]  [facility] -  Office  [phone]    '

## 2016-11-03 ENCOUNTER — Inpatient Hospital Stay (HOSPITAL_COMMUNITY): Payer: BC Managed Care – PPO

## 2016-11-03 DIAGNOSIS — Z01818 Encounter for other preprocedural examination: Secondary | ICD-10-CM

## 2016-11-03 DIAGNOSIS — I5042 Chronic combined systolic (congestive) and diastolic (congestive) heart failure: Secondary | ICD-10-CM

## 2016-11-03 DIAGNOSIS — I10 Essential (primary) hypertension: Secondary | ICD-10-CM

## 2016-11-03 DIAGNOSIS — I482 Chronic atrial fibrillation: Secondary | ICD-10-CM

## 2016-11-03 DIAGNOSIS — K56609 Unspecified intestinal obstruction, unspecified as to partial versus complete obstruction: Principal | ICD-10-CM

## 2016-11-03 LAB — CBC
HEMATOCRIT: 44.8 % (ref 39.0–52.0)
HEMOGLOBIN: 15.2 g/dL (ref 13.0–17.0)
MCH: 31.7 pg (ref 26.0–34.0)
MCHC: 33.9 g/dL (ref 30.0–36.0)
MCV: 93.3 fL (ref 78.0–100.0)
Platelets: 181 10*3/uL (ref 150–400)
RBC: 4.8 MIL/uL (ref 4.22–5.81)
RDW: 14 % (ref 11.5–15.5)
WBC: 8.2 10*3/uL (ref 4.0–10.5)

## 2016-11-03 LAB — BASIC METABOLIC PANEL
Anion gap: 9 (ref 5–15)
BUN: 17 mg/dL (ref 6–20)
CHLORIDE: 105 mmol/L (ref 101–111)
CO2: 25 mmol/L (ref 22–32)
Calcium: 9 mg/dL (ref 8.9–10.3)
Creatinine, Ser: 1.37 mg/dL — ABNORMAL HIGH (ref 0.61–1.24)
GFR calc Af Amer: >60 mL/min (ref >60)
GFR calc non Af Amer: 56 mL/min — ABNORMAL LOW (ref >60)
GLUCOSE: 99 mg/dL (ref 65–99)
POTASSIUM: 4.2 mmol/L (ref 3.5–5.1)
Sodium: 139 mmol/L (ref 135–145)

## 2016-11-03 MED ORDER — DILTIAZEM LOAD VIA INFUSION
10.0000 mg | Freq: Four times a day (QID) | INTRAVENOUS | Status: DC | PRN
  Administered 2016-11-03 – 2016-11-04 (×3): 10 mg via INTRAVENOUS
  Filled 2016-11-03: qty 10

## 2016-11-03 MED ORDER — AMIODARONE HCL IN DEXTROSE 360-4.14 MG/200ML-% IV SOLN
60.0000 mg/h | INTRAVENOUS | Status: AC
  Filled 2016-11-03 (×2): qty 200

## 2016-11-03 MED ORDER — MAGNESIUM SULFATE IN D5W 1-5 GM/100ML-% IV SOLN
1.0000 g | Freq: Once | INTRAVENOUS | Status: AC
  Administered 2016-11-03: 1 g via INTRAVENOUS
  Filled 2016-11-03: qty 100

## 2016-11-03 MED ORDER — SODIUM CHLORIDE 0.9 % IV BOLUS (SEPSIS)
500.0000 mL | Freq: Once | INTRAVENOUS | Status: AC
  Administered 2016-11-03: 500 mL via INTRAVENOUS

## 2016-11-03 MED ORDER — AMIODARONE LOAD VIA INFUSION
150.0000 mg | Freq: Once | INTRAVENOUS | Status: AC
  Administered 2016-11-03: 150 mg via INTRAVENOUS
  Filled 2016-11-03: qty 83.34

## 2016-11-03 MED ORDER — AMIODARONE HCL IN DEXTROSE 360-4.14 MG/200ML-% IV SOLN
30.0000 mg/h | INTRAVENOUS | Status: DC
  Administered 2016-11-03 – 2016-11-07 (×9): 30 mg/h via INTRAVENOUS
  Filled 2016-11-03 (×7): qty 200

## 2016-11-03 MED ORDER — METOPROLOL TARTRATE 5 MG/5ML IV SOLN
5.0000 mg | INTRAVENOUS | Status: DC
  Administered 2016-11-03 – 2016-11-04 (×6): 5 mg via INTRAVENOUS
  Filled 2016-11-03 (×6): qty 5

## 2016-11-03 MED ORDER — DEXTROSE 5 % IV SOLN
2.0000 g | INTRAVENOUS | Status: AC
  Filled 2016-11-03: qty 2

## 2016-11-03 NOTE — Progress Notes (Signed)
[facility] Progress Note     Subjective: Reports one episode of flatus yesterday, denies flatus or BM today. Denies abdominal pain.  Currently in a.fib with RVR - HR 134 bpm; denies CP/SOB Objective: Vital signs in last 24 hours: Temp:  [97.6 F (36.4 C)-98.5 F (36.9 C)] 98 F (36.7 C) (02/05 0807) Pulse Rate:  [26-136] 44 (02/05 1100) Resp:  [16-20] 20 (02/05 0807) BP: (107-133)/(76-102) 111/92 (02/05 1100) SpO2:  [91 %-97 %] 96 % (02/05 1100) Weight:  [92.4 kg (203 lb 9.6 oz)] 92.4 kg (203 lb 9.6 oz) (02/05 0359) Last BM Date: 10/29/16  Intake/Output from previous day: 02/04 0701 - 02/05 0700 In: 1360 [I.V.:1360] Out: 1350 [Urine:1350] Intake/Output this shift: Total I/O In: 58 [I.V.:58] Out: 500 [Urine:350; Emesis/NG output:150]  PE: Gen:  Alert, NAD, pleasant HEENT: NGT in place Card:  Irregularly irregular Abd: Soft, generalized tenderness to deep palpation - no peritonitis, mid distention, bowel sounds present Ext:  No erythema, edema, or tenderness   Lab Results:   Recent Labs  11/02/16 0407 11/03/16 0229  WBC 9.7 8.2  HGB 15.9 15.2  HCT 46.4 44.8  PLT 176 181   BMET  Recent Labs  11/02/16 0407 11/03/16 0229  NA 140 139  K 4.5 4.2  CL 107 105  CO2 26 25  GLUCOSE 105* 99  BUN 17 17  CREATININE 1.38* 1.37*  CALCIUM 9.0 9.0   PT/INR No results for input(s): LABPROT, INR in the last 72 hours. CMP     Component Value Date/Time   NA 139 11/03/2016 0229   K 4.2 11/03/2016 0229   CL 105 11/03/2016 0229   CO2 25 11/03/2016 0229   GLUCOSE 99 11/03/2016 0229   BUN 17 11/03/2016 0229   CREATININE 1.37 (H) 11/03/2016 0229   CREATININE 1.60 (H) 03/22/2015 1646   CALCIUM 9.0 11/03/2016 0229   PROT 5.4 (L) 11/02/2016 0407   ALBUMIN 3.1 (L) 11/02/2016 0407   AST 24 11/02/2016 0407   ALT 16 (L) 11/02/2016 0407   ALKPHOS 31 (L) 11/02/2016 0407   BILITOT 2.3 (H) 11/02/2016 0407   GFRNONAA 56 (L) 11/03/2016 0229   GFRAA >60  11/03/2016 0229   Lipase     Component Value Date/Time   LIPASE 14 10/31/2016 2036       Studies/Results: Dg Abd Portable 1v  Result Date: 11/03/2016 CLINICAL DATA:  Small bowel obstruction.  Nausea. EXAM: PORTABLE ABDOMEN - 1 VIEW COMPARISON:  11/02/2016. FINDINGS: Marked distention of small bowel. Nasogastric tube coiled in the fundus of the stomach. Worsening bowel distention compared with yesterday's radiograph, with increased size and number of distended small bowel loops. No abnormal calcifications. IMPRESSION: Worsening small bowel obstruction. Electronically Signed   By: Elsie Stain M.D.   On: 11/03/2016 07:59   Dg Abd Portable 1v  Result Date: 11/02/2016 CLINICAL DATA:  Small bowel obstruction. EXAM: PORTABLE ABDOMEN - 1 VIEW COMPARISON:  Abdominal plain film dated 11/01/2016. FINDINGS: Persistent small bowel dilatation within the upper abdomen and central abdomen. Air and stool noted within the large bowel. No evidence of soft tissue mass or abnormal fluid collection. No evidence of free intraperitoneal air seen. Mild degenerative change within the lower lumbar spine. No acute or suspicious osseous finding. IMPRESSION: No significant interval change. Distended air-filled small bowel loops within the upper abdomen and central abdomen, compatible with given history of small bowel obstruction. Electronically Signed   By: Bary Richard M.D.   On: 11/02/2016 09:51   Dg  Abd Portable 1v-small Bowel Obstruction Protocol-initial, 8 Hr Delay  Result Date: 11/01/2016 CLINICAL DATA:  Small-bowel obstruction, 8 hour delay EXAM: PORTABLE ABDOMEN - 1 VIEW COMPARISON:  11/01/2016 FINDINGS: Persistent small bowel dilatation is identified. The overall appearance is similar to that seen on the prior exam. No definitive free air is noted. Nasogastric catheter remains in the stomach. No significant contrast material is noted. Degenerative changes of the lumbar spine are noted. IMPRESSION: Nasogastric  catheter within the stomach. Changes consistent with small bowel obstruction stable from the prior exam. Electronically Signed   By: Alcide Clever M.D.   On: 11/01/2016 20:54    Anti-infectives: Anti-infectives    None     Assessment/Plan SBO - No history of abdominal surgery  - DG abdomen shows worsening small bowel distention - strict documentation of NGT output  Plan: not improving after >48 h of NGT decompression; no contrast appreciated in small bowel or colon on small bowel films, just worsening small bowel dilatation. Recommend exploratory laparotomy, possible bowel resection. Will discuss further with MD.   Patient needs better rate control and cardiac clearance prior to surgery. I will call cardiology.    LOS: 2 days    Adam Phenix , [facility] 11/03/2016, 11:07 AM Pager: [phone] Consults: [phone] Mon-Fri 7:00 am-4:30 pm Sat-Sun 7:00 am-11:30 am

## 2016-11-03 NOTE — Progress Notes (Signed)
ANTICOAGULATION CONSULT NOTE  Pharmacy Consult for Lovenox Indication: atrial fibrillation  No Known Allergies  Patient Measurements: Height: 6' (182.9 cm) Weight: 203 lb 9.6 oz (92.4 kg) IBW/kg (Calculated) : 77.6  Vital Signs: Temp: 98.1 F (36.7 C) (02/05 1219) Temp Source: Oral (02/05 1219) BP: 126/81 (02/05 1219) Pulse Rate: 127 (02/05 1219)  Labs:  Recent Labs  11/01/16 0947 11/02/16 0407 11/03/16 0229  HGB 15.6 15.9 15.2  HCT 44.1 46.4 44.8  PLT 177 176 181  CREATININE 1.33* 1.38* 1.37*    Estimated Creatinine Clearance: 65.3 mL/min (by C-G formula based on SCr of 1.37 mg/dL (H)).   Medical History: Past Medical History:  Diagnosis Date  . Atrial fibrillation (HCC)   . Essential hypertension   . Gout   . PUD (peptic ulcer disease)     Assessment: 56 y.o. male admitted with abdominal pain/SBO, h/o Afib and Eliquis on hold, on Lovenox currently.  CBC stable.  Scr stable, CrCl ~ 65 ml/min  Goal of Therapy:  Full anticoagulation with Lovenox Monitor platelets by anticoagulation protocol: Yes   Plan:  Continue Lovenox 90 mg SQ q12h Will need to hold prior to OR. CBC at least q 72 hrs.  Tad Moore, BCPS  Clinical Pharmacist Pager [phone]  11/03/2016 12:46 PM

## 2016-11-03 NOTE — Progress Notes (Signed)
Pt remains in irregular heart rate in 120-140s with amiodarone and cardiazem. Pt continues to deny pain throughout shift. NAD noted.

## 2016-11-03 NOTE — Progress Notes (Signed)
PROGRESS NOTE                                                                                                                                                                                                             Patient Demographics:    Javier Berry, is a 56 y.o. male, DOB - 1961/03/14, XLK:[id number]  Admit date - 10/31/2016   Admitting Physician Inez Catalina, MD  Outpatient Primary MD for the patient is [facility], MD  LOS - 2  Chief Complaint  Patient presents with  . Constipation  . Abdominal Pain  . Emesis       Brief Narrative   Javier Berry is a 56 y.o. male with medical history significant of Atrial fibrillation, HTN, PUD who presents with abdominal pain, nausea and vomiting for 3 days, he was diagnosed with small bowel obstruction admitted on 11/01/2016.   Subjective:    Javier Berry today has, No headache, No chest pain, No abdominal pain - mild Nausea, No new weakness tingling or numbness, No Cough - SOB.    Assessment  & Plan :     1. SBO obstruction. No previous abdominal surgeries, ever had surveillance colonoscopy. Currently has NG tube, nothing by mouth, IV fluids, pain-free with mild nausea, surgery consulted and following, He likely will require surgical intervention.  2. Chronic A. fib currently in RVR. [place] vasc 2 score of 2 -  Eliquis on hold, continues to be in RVR will initiate amiodarone bolus with infusion, we'll try to minimize Cardizem exposure due to depressed EF, as needed IV Lopressor and try to titrate off Cardizem drip as soon as possible. Follows with Dr. Jens Som, will consult cardiology as well.  3. 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 Currently compensated. IV fluids with caution and monitor. No rales or shortness of breath, pulse ox stable will monitor.  4. Hypertension. Lopressor and Cardizem as above and monitor.    Family Communication  :   None  Code Status :  Full  Diet : Diet NPO time specified   Disposition Plan  :  Tele  Consults  :  CCS  Procedures  :  CT scan abdomen and pelvis showing small bowel obstruction with transition point around the ileum.  DVT Prophylaxis  :  Lovenox    Lab Results  Component Value  Date   PLT 181 11/03/2016    Inpatient Medications  Scheduled Meds: . amiodarone  150 mg Intravenous Once  . enoxaparin (LOVENOX) injection  90 mg Subcutaneous BID  . mouth rinse  15 mL Mouth Rinse BID  . metoprolol  5 mg Intravenous Q8H  . nitroGLYCERIN  0.5 inch Topical Q6H   Continuous Infusions: . 0.9 % NaCl with KCl 40 mEq / L 50 mL/hr at 11/03/16 0400  . amiodarone     Followed by  . amiodarone    . diltiazem (CARDIZEM) infusion 15 mg (11/03/16 0942)   PRN Meds:.albuterol, diltiazem, hydrALAZINE, morphine injection, ondansetron (ZOFRAN) IV  Antibiotics  :    Anti-infectives    None         Objective:   Vitals:   11/03/16 0359 11/03/16 0400 11/03/16 0700 11/03/16 0807  BP: 125/89     Pulse:  77 (!) 34 (!) 109  Resp: 20   20  Temp: 98.2 F (36.8 C)   98 F (36.7 C)  TempSrc: Oral   Oral  SpO2: 95% 93% 94% 97%  Weight: 92.4 kg (203 lb 9.6 oz)     Height:        Wt Readings from Last 3 Encounters:  11/03/16 92.4 kg (203 lb 9.6 oz)  03/27/15 99.8 kg (220 lb)  03/22/15 99.8 kg (220 lb)     Intake/Output Summary (Last 24 hours) at 11/03/16 1040 Last data filed at 11/03/16 0942  Gross per 24 hour  Intake             1228 ml  Output             1650 ml  Net             -422 ml     Physical Exam  Awake Alert, Oriented X 3, No new F.N deficits, Normal affect [place].AT,PERRAL Supple Neck,No JVD, No cervical lymphadenopathy appriciated.  Symmetrical Chest wall movement, Good air movement bilaterally, CTAB RRR,No Gallops,Rubs or new Murmurs, No Parasternal Heave Hypoactive B.Sounds, NG in place, Abd Soft, No tenderness, No organomegaly appriciated, No rebound - guarding  or rigidity. No Cyanosis, Clubbing or edema, No new Rash or bruise       Data Review:    CBC  Recent Labs Lab 10/31/16 2036 11/01/16 0632 11/02/16 0407 11/03/16 0229  WBC 12.3* 11.1* 9.7 8.2  HGB 17.6* 15.6 15.9 15.2  HCT 50.1 44.1 46.4 44.8  PLT 196 177 176 181  MCV 91.4 91.5 92.8 93.3  MCH 32.1 32.4 31.8 31.7  MCHC 35.1 35.4 34.3 33.9  RDW 13.7 13.8 14.1 14.0    Chemistries   Recent Labs Lab 10/31/16 2036 11/01/16 0632 11/02/16 0407 11/03/16 0229  NA 139 140 140 139  K 4.8 4.2 4.5 4.2  CL 102 106 107 105  CO2 26 26 26 25   GLUCOSE 152* 122* 105* 99  BUN 10 9 17 17   CREATININE 1.38* 1.33* 1.38* 1.37*  CALCIUM 10.1 9.2 9.0 9.0  MG  --   --  1.8  --   AST 33 25 24  --   ALT 22 19 16*  --   ALKPHOS 42 34* 31*  --   BILITOT 2.7* 2.3* 2.3*  --    ------------------------------------------------------------------------------------------------------------------ No results for input(s): CHOL, HDL, LDLCALC, TRIG, CHOLHDL, LDLDIRECT in the last 72 hours.  No results found for: HGBA1C ------------------------------------------------------------------------------------------------------------------ No results for input(s): TSH, T4TOTAL, T3FREE, THYROIDAB in the last 72 hours.  Invalid input(s):  FREET3 ------------------------------------------------------------------------------------------------------------------ No results for input(s): VITAMINB12, FOLATE, FERRITIN, TIBC, IRON, RETICCTPCT in the last 72 hours.  Coagulation profile No results for input(s): INR, PROTIME in the last 168 hours.  No results for input(s): DDIMER in the last 72 hours.  Cardiac Enzymes No results for input(s): CKMB, TROPONINI, MYOGLOBIN in the last 168 hours.  Invalid input(s): CK ------------------------------------------------------------------------------------------------------------------    Component Value Date/Time   BNP 378.1 (H) 03/22/2015 1646    Micro Results Recent  Results (from the past 240 hour(s))  MRSA PCR Screening     Status: None   Collection Time: 11/02/16  5:15 AM  Result Value Ref Range Status   MRSA by PCR NEGATIVE NEGATIVE Final    Comment:        The GeneXpert MRSA Assay (FDA approved for NASAL specimens only), is one component of a comprehensive MRSA colonization surveillance program. It is not intended to diagnose MRSA infection nor to guide or monitor treatment for MRSA infections.     Radiology Reports Ct Abdomen Pelvis W Contrast  Result Date: 11/01/2016 CLINICAL DATA:  Periumbilical pain with episodes of emesis for 2 days. EXAM: CT ABDOMEN AND PELVIS WITH CONTRAST TECHNIQUE: Multidetector CT imaging of the abdomen and pelvis was performed using the standard protocol following bolus administration of intravenous contrast. CONTRAST:  ISOVUE-300 IOPAMIDOL (ISOVUE-300) INJECTION 61% COMPARISON:  None. FINDINGS: Lower chest: No acute abnormality. Hepatobiliary: No focal liver abnormality is seen. No gallstones, gallbladder wall thickening, or biliary dilatation. Pancreas: Unremarkable. No pancreatic ductal dilatation or surrounding inflammatory changes. Spleen: Normal in size without focal abnormality. Adrenals/Urinary Tract: Adrenal glands are unremarkable. Kidneys are normal, without renal calculi, focal lesion, or hydronephrosis. Bladder is unremarkable. Stomach/Bowel: Dilatation of the stomach and small bowel with abrupt transition to decompressed ileum. At the transition point in the right lower quadrant, no mass or hernia is evident. Colon is decompressed. Appendix is normal. Vascular/Lymphatic: The abdominal aorta is normal in caliber with mild atherosclerotic calcification. Reproductive: Unremarkable Other: Small volume ascites, perihepatic and in the dependent pelvis. Musculoskeletal: No significant skeletal lesion. IMPRESSION: Dilatation of small bowel with caliber transition in the ileum, consistent with a small bowel  obstruction. No mass or hernia is evident at the transition point. Small volume ascites. Electronically Signed   By: Ellery Plunk M.D.   On: 11/01/2016 00:00   Dg Abd Portable 1v  Result Date: 11/03/2016 CLINICAL DATA:  Small bowel obstruction.  Nausea. EXAM: PORTABLE ABDOMEN - 1 VIEW COMPARISON:  11/02/2016. FINDINGS: Marked distention of small bowel. Nasogastric tube coiled in the fundus of the stomach. Worsening bowel distention compared with yesterday's radiograph, with increased size and number of distended small bowel loops. No abnormal calcifications. IMPRESSION: Worsening small bowel obstruction. Electronically Signed   By: Elsie Stain M.D.   On: 11/03/2016 07:59   Dg Abd Portable 1v  Result Date: 11/02/2016 CLINICAL DATA:  Small bowel obstruction. EXAM: PORTABLE ABDOMEN - 1 VIEW COMPARISON:  Abdominal plain film dated 11/01/2016. FINDINGS: Persistent small bowel dilatation within the upper abdomen and central abdomen. Air and stool noted within the large bowel. No evidence of soft tissue mass or abnormal fluid collection. No evidence of free intraperitoneal air seen. Mild degenerative change within the lower lumbar spine. No acute or suspicious osseous finding. IMPRESSION: No significant interval change. Distended air-filled small bowel loops within the upper abdomen and central abdomen, compatible with given history of small bowel obstruction. Electronically Signed   By: Bary Richard M.D.   On: 11/02/2016 09:51  Dg Abd Portable 1v-small Bowel Obstruction Protocol-initial, 8 Hr Delay  Result Date: 11/01/2016 CLINICAL DATA:  Small-bowel obstruction, 8 hour delay EXAM: PORTABLE ABDOMEN - 1 VIEW COMPARISON:  11/01/2016 FINDINGS: Persistent small bowel dilatation is identified. The overall appearance is similar to that seen on the prior exam. No definitive free air is noted. Nasogastric catheter remains in the stomach. No significant contrast material is noted. Degenerative changes of the  lumbar spine are noted. IMPRESSION: Nasogastric catheter within the stomach. Changes consistent with small bowel obstruction stable from the prior exam. Electronically Signed   By: Alcide Clever M.D.   On: 11/01/2016 20:54   Dg Abd Portable 1 View  Result Date: 11/01/2016 CLINICAL DATA:  Nasogastric tube placement. Follow-up small bowel obstruction. EXAM: PORTABLE ABDOMEN - 1 VIEW COMPARISON:  CT abdomen and pelvis October 31, 2016 FINDINGS: Nasogastric tube tip and side-port project in proximal stomach. Multiple loops of gas distended small bowel measuring to 4.6 cm. No intra-abdominal mass effect or pathologic calcifications. IMPRESSION: Nasogastric tube tip projects in proximal stomach. Electronically Signed   By: Awilda Metro M.D.   On: 11/01/2016 03:03    Time Spent in minutes  30   Sharlette Jansma K M.D on 11/03/2016 at 10:40 AM  Between 7am to 7pm - Pager - [phone]  After 7pm go to [email or web address] - password [facility]  [facility] -  Office  [phone]    '

## 2016-11-03 NOTE — Consult Note (Addendum)
Cardiology Consult    Patient ID: Javier Berry MRN: [id number], DOB/AGE: Sep 10, 1961   Admit date: 10/31/2016 Date of Consult: 11/03/2016  Primary Physician: Erlinda Hong, MD Primary Cardiologist: Dr. Jens Som Requesting Provider: Dr. Thedore Mins Reason for Consultation: AF/Preop evaluation  Patient Profile    56 yo male with PMH of AF, HTN, Chronic combined HF, CKD 3 who presented with abd pain, n/v and diagnosed with SBO. Cardiology asked to see for preop evaluation.   Past Medical History   Past Medical History:  Diagnosis Date  . Atrial fibrillation (HCC)   . Essential hypertension   . Gout   . PUD (peptic ulcer disease)     Past Surgical History:  Procedure Laterality Date  . CARDIOVERSION N/A 12/20/2013   Procedure: CARDIOVERSION;  Surgeon: Lewayne Bunting, MD;  Location: [facility] [address] ENDOSCOPY;  Service: Cardiovascular;  Laterality: N/A;  . TOE SURGERY       Allergies  No Known Allergies  History of Present Illness    Javier Berry is a 56 yo male with PMH of  AF, HTN, Chronic combined HF, CKD 3. He is followed by Dr. Jens Som, and underwent failed DCCV in 3/15. He was seen in the office in 4/15 and told to increase his metoprolol, but thought he was asked to stop. Presented back to the hospital in 5/16 with dyspnea, found to have an elevated HR, and hypertensive. He was placed on lasix at that time, continued on ARB with aldactone added.   Echo 5/16 EF 35-40%, with LVH, with severely dilated LA, PA pressure 42.   He was last seen in the office on 6/16 by Norma Fredrickson in follow up. Stated his breathing improved. Weight was stable at 220lbs. Bp was noted not at goal, and hydralazine was increased to 50mg  TID. AF noted better rate control. Did undergo myoview in 6/16 that was normal but noted his decreased EF with no signs of ischemia.   Reports he has been doing well and in his usual state of health up until about 3 days ago. 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 Does not normally have any anginal symptoms. Does report that when he walks up a significant flight of stairs he gets dyspneic. Presented to the ED with abd pain with n/v for 3 days. Had constipation, and attempted to use and enema without much success.   In the ED CT abd/pelvis showed SBO, with NG tube placed. Labs showed stable electrolytes, Cr 1.3 (which appears around baseline), Hgb stable, WBC 12.3, total bili 2.7. EKG showed AF with rates 130s, with lateral TWI noted on previous tracings.  His Eliquis was held on admission with anticipation for possible surgical intervention. He was placed on IV lopressor for HR control. IC cardizem was added on 11/02/16 but rate remained uncontrolled. He was switched to IV amiodarone with a bolus on 11/03/16 for attempts at better rate control. Surgery has been consulted and following for possible abd exploration.   Inpatient Medications    . enoxaparin (LOVENOX) injection  90 mg Subcutaneous BID  . mouth rinse  15 mL Mouth Rinse BID  . metoprolol  5 mg Intravenous Q8H  . nitroGLYCERIN  0.5 inch Topical Q6H    Family History    Family History  Problem Relation Age of Onset  . Kidney disease Mother   . Diabetes Mother   . Hypertension Father   . Heart disease Father  CHF  . Kidney disease Sister   . Stroke Sister   . Drug abuse Brother   . Heart disease Brother     Murmur  . Colon cancer Neg Hx     Social History    Social History   Social History  . Marital status: Married    Spouse name: N/A  . Number of children: 3  . Years of education: N/A   Occupational History  .  [name]   Social History Main Topics  . Smoking status: Never Smoker  . Smokeless tobacco: Never Used  . Alcohol use No     Comment: Occasional  . Drug use: No  . Sexual activity: Not on file   Other Topics Concern  . Not on file   Social History Narrative  . No narrative on file     Review  of Systems    General:  No chills, fever, night sweats or weight changes.  Cardiovascular:  No chest pain, dyspnea on exertion, edema, orthopnea, palpitations, paroxysmal nocturnal dyspnea. Dermatological: No rash, lesions/masses Respiratory: No cough, dyspnea Urologic: No hematuria, dysuria Abdominal:   See HPI Neurologic:  No visual changes, wkns, changes in mental status. All other systems reviewed and are otherwise negative except as noted above.  Physical Exam    Blood pressure (!) 129/106, pulse (!) 45, temperature 98.1 F (36.7 C), temperature source Oral, resp. rate 20, height 6' (1.829 m), weight 203 lb 9.6 oz (92.4 kg), SpO2 96 %.  General: [name], NAD Psych: Normal affect. Neuro: Alert and oriented X 3. Moves all extremities spontaneously. HEENT: Normal  Neck: Supple without bruits or JVD. Lungs:  Resp regular and unlabored, CTA. Heart: RRR no s3, s4, or murmurs. Abdomen: Soft, non-tender, non-distended, BS + x 4.  Extremities: No clubbing, cyanosis or edema. DP/PT/Radials 2+ and equal bilaterally.  Labs    Troponin (Point of Care Test) No results for input(s): TROPIPOC in the last 72 hours. No results for input(s): CKTOTAL, CKMB, TROPONINI in the last 72 hours. Lab Results  Component Value Date   WBC 8.2 11/03/2016   HGB 15.2 11/03/2016   HCT 44.8 11/03/2016   MCV 93.3 11/03/2016   PLT 181 11/03/2016    Recent Labs Lab 11/02/16 0407 11/03/16 0229  NA 140 139  K 4.5 4.2  CL 107 105  CO2 26 25  BUN 17 17  CREATININE 1.38* 1.37*  CALCIUM 9.0 9.0  PROT 5.4*  --   BILITOT 2.3*  --   ALKPHOS 31*  --   ALT 16*  --   AST 24  --   GLUCOSE 105* 99   No results found for: CHOL, HDL, LDLCALC, TRIG No results found for: [facility]   Radiology Studies    Ct Abdomen Pelvis W Contrast  Result Date: 11/01/2016 CLINICAL DATA:  Periumbilical pain with episodes of emesis for 2 days. EXAM: CT ABDOMEN AND PELVIS WITH CONTRAST TECHNIQUE: Multidetector CT imaging  of the abdomen and pelvis was performed using the standard protocol following bolus administration of intravenous contrast. CONTRAST:  ISOVUE-300 IOPAMIDOL (ISOVUE-300) INJECTION 61% COMPARISON:  None. FINDINGS: Lower chest: No acute abnormality. Hepatobiliary: No focal liver abnormality is seen. No gallstones, gallbladder wall thickening, or biliary dilatation. Pancreas: Unremarkable. No pancreatic ductal dilatation or surrounding inflammatory changes. Spleen: Normal in size without focal abnormality. Adrenals/Urinary Tract: Adrenal glands are unremarkable. Kidneys are normal, without renal calculi, focal lesion, or hydronephrosis. Bladder is unremarkable. Stomach/Bowel: Dilatation of the stomach and small  bowel with abrupt transition to decompressed ileum. At the transition point in the right lower quadrant, no mass or hernia is evident. Colon is decompressed. Appendix is normal. Vascular/Lymphatic: The abdominal aorta is normal in caliber with mild atherosclerotic calcification. Reproductive: Unremarkable Other: Small volume ascites, perihepatic and in the dependent pelvis. Musculoskeletal: No significant skeletal lesion. IMPRESSION: Dilatation of small bowel with caliber transition in the ileum, consistent with a small bowel obstruction. No mass or hernia is evident at the transition point. Small volume ascites. Electronically Signed   By: Ellery Plunk M.D.   On: 11/01/2016 00:00   Dg Abd Portable 1v  Result Date: 11/03/2016 CLINICAL DATA:  Small bowel obstruction.  Nausea. EXAM: PORTABLE ABDOMEN - 1 VIEW COMPARISON:  11/02/2016. FINDINGS: Marked distention of small bowel. Nasogastric tube coiled in the fundus of the stomach. Worsening bowel distention compared with yesterday's radiograph, with increased size and number of distended small bowel loops. No abnormal calcifications. IMPRESSION: Worsening small bowel obstruction. Electronically Signed   By: Elsie Stain M.D.   On: 11/03/2016 07:59    Dg Abd Portable 1v  Result Date: 11/02/2016 CLINICAL DATA:  Small bowel obstruction. EXAM: PORTABLE ABDOMEN - 1 VIEW COMPARISON:  Abdominal plain film dated 11/01/2016. FINDINGS: Persistent small bowel dilatation within the upper abdomen and central abdomen. Air and stool noted within the large bowel. No evidence of soft tissue mass or abnormal fluid collection. No evidence of free intraperitoneal air seen. Mild degenerative change within the lower lumbar spine. No acute or suspicious osseous finding. IMPRESSION: No significant interval change. Distended air-filled small bowel loops within the upper abdomen and central abdomen, compatible with given history of small bowel obstruction. Electronically Signed   By: Bary Richard M.D.   On: 11/02/2016 09:51   Dg Abd Portable 1v-small Bowel Obstruction Protocol-initial, 8 Hr Delay  Result Date: 11/01/2016 CLINICAL DATA:  Small-bowel obstruction, 8 hour delay EXAM: PORTABLE ABDOMEN - 1 VIEW COMPARISON:  11/01/2016 FINDINGS: Persistent small bowel dilatation is identified. The overall appearance is similar to that seen on the prior exam. No definitive free air is noted. Nasogastric catheter remains in the stomach. No significant contrast material is noted. Degenerative changes of the lumbar spine are noted. IMPRESSION: Nasogastric catheter within the stomach. Changes consistent with small bowel obstruction stable from the prior exam. Electronically Signed   By: Alcide Clever M.D.   On: 11/01/2016 20:54   Dg Abd Portable 1 View  Result Date: 11/01/2016 CLINICAL DATA:  Nasogastric tube placement. Follow-up small bowel obstruction. EXAM: PORTABLE ABDOMEN - 1 VIEW COMPARISON:  CT abdomen and pelvis October 31, 2016 FINDINGS: Nasogastric tube tip and side-port project in proximal stomach. Multiple loops of gas distended small bowel measuring to 4.6 cm. No intra-abdominal mass effect or pathologic calcifications. IMPRESSION: Nasogastric tube tip projects in proximal  stomach. Electronically Signed   By: Awilda Metro M.D.   On: 11/01/2016 03:03    ECG & Cardiac Imaging    EKG: AF rates 130 with TWI in lateral leads, noted on previous tracings  Echo: 5/16  Study Conclusions  - Left ventricle: The cavity size was normal. Wall thickness was   increased in a pattern of mild LVH. Systolic function was   moderately reduced. The estimated ejection fraction was in the   range of 35% to 40%. Diffuse hypokinesis. The study is not   technically sufficient to allow evaluation of LV diastolic   function. - Mitral valve: There was mild regurgitation. - Left atrium: The atrium  was severely dilated. - Right ventricle: Systolic function was mildly to moderately   reduced. - Right atrium: The atrium was moderately to severely dilated.   Central venous pressure (est): 8 mm Hg. - Tricuspid valve: There was mild regurgitation. - Pulmonary arteries: PA peak pressure: 42 mm Hg (S). - Pericardium, extracardiac: There was no pericardial effusion.  Impressions:  - Mild LVH with LVEF 35-40%, diffuse hypokinesis with some septal   dyssynergy. There has been reduction in LVEF compared to prior   study February 2015. Indeterminate diastolic function - suspect   at least grade 2. Severe left atrial enlargement. Mildly to   moderately reduced RV contraction. Mild tricuspid regurgitation   with PASP 42 mmHg.  Assessment & Plan    56 yo male with PMH of AF, HTN, Chronic combined HF, CKD 3 who presented with abd pain, n/v and diagnosed with SBO. Cardiology asked to see for preop evaluation.  1. AF RVR: Has failed DCCV in the past, and last office visit in 2/16 noted better control with BB and on Eliquis for OAC. Noted difficulty controlling rate this admission likely 2/2 acute illness. Currently on Dilt and Amio drip, with Dilt being weaned. Eliquis held in anticipation of surgical intervention, on SQ lovenox.  -- In review of telemetry rate remains elevated. Agree  with weaning Dilt given known decreased EF. Would continue with IV amiodarone, increase metoprolol to 5mg  q4hrs to attempt better rate control.  -- on SQ Lovenox for AC.   2. Chronic combined HF: Last echo 2/16 noted mild LVH 35-40% with diffuse hypokinesis, with questionable G2DD. Lasix currently held. Does not appear to be volume overloaded on exam, able to lay flat in the bed. Would monitor IV fluids closely. Given his cardiac hx would place him at moderate risk for perioperative complications. Currently without anginal symptoms, with METS>4.  -- repeat echo  3. HTN: Elevated diastolic pressure, but currently NPO with NG tube in place. On IV lopressor. Recommendations as above   4. CKD III: Cr stable at baseline  5. SBO: surgery following  Signed, Laverda Page, NP-C Pager [phone] 11/03/2016, 2:44 PM   Patient seen and examined. Agree with assessment and plan.  Mr. Studley is a 56 year old African-American male who has a history of permanent atrial fibrillation for which she's been on chronic anticoagulation.  He has a history of hypertension, chronic combined systolic and diastolic heart failure in a developed several days of abdominal pain with nausea and vomiting and was found to have a small bowel obstruction.  Presently, his obstruction has not cleared and he may ultimately require surgery.  He has been demonstrated have atrial fibrillation with rapid ventricular rate in the 130s.  He has previously been found to have mild T-wave abnormalities anterolaterally and these are slightly more prominent with his increased rate.  His eloquence has been held in anticipation for possible surgical intervention.  He has been on IV Lopressor at 5 mg every 8 hours which may be inadequate frequency due to the half-life of intravenous therapy.  His diltiazem has been weaned and he has been started on IV amiodarone bolus plus infusion.  His last echo Doppler study in May 2016 showed an EF of 35-40% with  left ventricular hypertrophy, significant biatrial enlargement, and mild pulmonary hypertension.  He has remained very active at work and denies any anginal type symptomatology.  On exam, he appears euvolemic.  Heart rate is in the 120s and irregularly irregular consistent with atrial fibrillation.  He  has diastolic hypertension.  JVD approximate 7-8 cm.  His lungs were clear without rales or wheezes.  Rhythm was irregularly irregular with a 1/6 systolic murmur.  Abdomen was soft with mild tenderness to deep palpation.  Pulses were adequate.  There was no edema.  Homans sign was negative.  Neurologic exam is grossly nonfocal.  He is now been started on IV amiodarone.  I will change the frequency of IV Lopressor from 5 mg every 8 hours to 5 mg every 4 hours and once his ventricular rate slows down, particularly once amiodarone level, start to build we may be able to further decrease Lopressor to 2.5 mg every 4 hours.  We will schedule him for echo Doppler study to reassess systolic and diastolic function.  I personally reviewed his myocardial perfusion images from the study of May 2016 and no ischemia was demonstrated.  If surgery is necessary  once ventricular rate is controlled okay from a cardiac standpoint to proceed but will need postoperative ECG and telemetry   Lennette Bihari, MD, [facility] 11/03/2016 4:49 PM

## 2016-11-04 ENCOUNTER — Encounter (HOSPITAL_COMMUNITY): Payer: Self-pay | Admitting: Anesthesiology

## 2016-11-04 ENCOUNTER — Inpatient Hospital Stay (HOSPITAL_COMMUNITY): Payer: BC Managed Care – PPO

## 2016-11-04 ENCOUNTER — Encounter (HOSPITAL_COMMUNITY)
Admission: EM | Disposition: A | Discharge status: Home / Self Care | Payer: Self-pay | Source: Home / Self Care | Attending: Internal Medicine

## 2016-11-04 DIAGNOSIS — Z01810 Encounter for preprocedural cardiovascular examination: Secondary | ICD-10-CM

## 2016-11-04 DIAGNOSIS — N183 Chronic kidney disease, stage 3 (moderate): Secondary | ICD-10-CM

## 2016-11-04 LAB — ECHOCARDIOGRAM COMPLETE
Height: 72 in
Weight: 3184 oz

## 2016-11-04 LAB — BASIC METABOLIC PANEL
ANION GAP: 9 (ref 5–15)
BUN: 16 mg/dL (ref 6–20)
CALCIUM: 8.9 mg/dL (ref 8.9–10.3)
CO2: 26 mmol/L (ref 22–32)
CREATININE: 1.36 mg/dL — AB (ref 0.61–1.24)
Chloride: 106 mmol/L (ref 101–111)
GFR, EST NON AFRICAN AMERICAN: 56 mL/min — AB (ref >60)
Glucose, Bld: 104 mg/dL — ABNORMAL HIGH (ref 65–99)
Potassium: 4.4 mmol/L (ref 3.5–5.1)
SODIUM: 141 mmol/L (ref 135–145)

## 2016-11-04 LAB — MAGNESIUM: MAGNESIUM: 2.1 mg/dL (ref 1.7–2.4)

## 2016-11-04 LAB — CBC
HEMATOCRIT: 43.5 % (ref 39.0–52.0)
Hemoglobin: 14.9 g/dL (ref 13.0–17.0)
MCH: 31.8 pg (ref 26.0–34.0)
MCHC: 34.3 g/dL (ref 30.0–36.0)
MCV: 92.9 fL (ref 78.0–100.0)
Platelets: 173 10*3/uL (ref 150–400)
RBC: 4.68 MIL/uL (ref 4.22–5.81)
RDW: 14.2 % (ref 11.5–15.5)
WBC: 6.1 10*3/uL (ref 4.0–10.5)

## 2016-11-04 SURGERY — LAPAROTOMY, EXPLORATORY
Anesthesia: General

## 2016-11-04 MED ORDER — METOPROLOL TARTRATE 50 MG PO TABS
100.0000 mg | ORAL_TABLET | Freq: Two times a day (BID) | ORAL | Status: DC
  Administered 2016-11-04: 100 mg via ORAL
  Filled 2016-11-04: qty 2

## 2016-11-04 MED ORDER — DIGOXIN 0.25 MG/ML IJ SOLN
0.1250 mg | Freq: Four times a day (QID) | INTRAMUSCULAR | Status: AC
Start: 2016-11-04 — End: 2016-11-04
  Administered 2016-11-04 (×2): 0.125 mg via INTRAVENOUS
  Filled 2016-11-04 (×2): qty 2

## 2016-11-04 MED ORDER — METOPROLOL TARTRATE 5 MG/5ML IV SOLN
5.0000 mg | INTRAVENOUS | Status: DC | PRN
  Administered 2016-11-04 – 2016-11-05 (×3): 5 mg via INTRAVENOUS
  Filled 2016-11-04 (×2): qty 5

## 2016-11-04 MED ORDER — DIGOXIN 125 MCG PO TABS
0.1250 mg | ORAL_TABLET | Freq: Every day | ORAL | Status: DC
  Administered 2016-11-04 – 2016-11-08 (×5): 0.125 mg via ORAL
  Filled 2016-11-04 (×5): qty 1

## 2016-11-04 MED ORDER — METOPROLOL TARTRATE 5 MG/5ML IV SOLN
2.5000 mg | INTRAVENOUS | Status: DC | PRN
  Filled 2016-11-04 (×2): qty 5

## 2016-11-04 MED ORDER — WHITE PETROLATUM GEL
Status: AC
  Filled 2016-11-04: qty 1

## 2016-11-04 NOTE — Progress Notes (Signed)
Progress Note  Patient Name: Javier Berry Date of Encounter: 11/04/2016  Primary Cardiologist: Dr. Jens Som  Subjective   Feels much better today; had BM x2.   Inpatient Medications    Scheduled Meds: . cefOXitin  2 g Intravenous To SS-Surg  . digoxin  0.125 mg Intravenous Q6H  . enoxaparin (LOVENOX) injection  90 mg Subcutaneous BID  . mouth rinse  15 mL Mouth Rinse BID  . metoprolol  5 mg Intravenous Q4H  . nitroGLYCERIN  0.5 inch Topical Q6H  . white petrolatum       Continuous Infusions: . 0.9 % NaCl with KCl 40 mEq / L 50 mL/hr (11/03/16 2330)  . amiodarone 30 mg/hr (11/04/16 0400)  . diltiazem (CARDIZEM) infusion 10 mg/hr (11/04/16 0120)   PRN Meds: albuterol, diltiazem, hydrALAZINE, morphine injection, ondansetron (ZOFRAN) IV   Vital Signs    Vitals:   11/04/16 0400 11/04/16 0500 11/04/16 0600 11/04/16 0806  BP: (!) 125/113 (!) 132/105  (!) 152/108  Pulse: (!) 132 (!) 115 (!) 130 (!) 134  Resp:      Temp: 97.8 F (36.6 C)   97.4 F (36.3 C)  TempSrc: Oral   Oral  SpO2: 97% 96% 98% 95%  Weight:      Height:        Intake/Output Summary (Last 24 hours) at 11/04/16 0839 Last data filed at 11/04/16 0600  Gross per 24 hour  Intake          2090.18 ml  Output             1600 ml  Net           490.18 ml    I/O since admission:   Filed Weights   11/02/16 0438 11/03/16 0359 11/04/16 0337  Weight: 203 lb 12.8 oz (92.4 kg) 203 lb 9.6 oz (92.4 kg) 199 lb (90.3 kg)    Telemetry    AF 114 - 125 - Personally Reviewed  ECG    11/01/2016 ECG (independently read by me): AF at 131; LAD, T wave inversion I, aVL, V3-6  Will repeat ECG today  Physical Exam   BP (!) 152/108 (BP Location: Right Arm)   Pulse (!) 134   Temp 97.4 F (36.3 C) (Oral)   Resp 15   Ht 6' (1.829 m)   Wt 199 lb (90.3 kg)   SpO2 95%   BMI 26.99 kg/m  General: Alert, oriented, no distress.  Skin: normal turgor, no rashes, warm and dry HEENT: Normocephalic, atraumatic. Pupils  equal round and reactive to light; sclera anicteric; extraocular muscles intact;  Nose without nasal septal hypertrophy Mouth/Parynx benign; Mallinpatti scale 3 Neck: No JVD, no carotid bruits; normal carotid upstroke Lungs: clear to ausculatation and percussion; no wheezing or rales Chest wall: without tenderness to palpitation Heart: PMI not displaced, RRR, s1 s2 normal, 1/6 systolic murmur, no diastolic murmur, no rubs, gallops, thrills, or heaves Abdomen: resolution of prior tenderness;  soft; no hepatosplenomehaly, BS+; abdominal aorta nontender and not dilated by palpation. Back: no CVA tenderness Pulses 2+ Musculoskeletal: full range of motion, normal strength, no joint deformities Extremities: no clubbing cyanosis or edema, Homan's sign negative  Neurologic: grossly nonfocal; Cranial nerves grossly wnl Psychologic: Normal mood and affect    Labs    Chemistry Recent Labs Lab 10/31/16 2036 11/01/16 0632 11/02/16 0407 11/03/16 0229 11/04/16 0230  NA 139 140 140 139 141  K 4.8 4.2 4.5 4.2 4.4  CL 102 106 107 105 106  CO2 26 26 26 25 26   GLUCOSE 152* 122* 105* 99 104*  BUN 10 9 17 17 16   CREATININE 1.38* 1.33* 1.38* 1.37* 1.36*  CALCIUM 10.1 9.2 9.0 9.0 8.9  PROT 6.8 5.7* 5.4*  --   --   ALBUMIN 3.9 3.1* 3.1*  --   --   AST 33 25 24  --   --   ALT 22 19 16*  --   --   ALKPHOS 42 34* 31*  --   --   BILITOT 2.7* 2.3* 2.3*  --   --   GFRNONAA 55* 58* 55* 56* 56*  GFRAA >60 >60 >60 >60 >60  ANIONGAP 11 8 7 9 9      Hematology Recent Labs Lab 11/02/16 0407 11/03/16 0229 11/04/16 0230  WBC 9.7 8.2 6.1  RBC 5.00 4.80 4.68  HGB 15.9 15.2 14.9  HCT 46.4 44.8 43.5  MCV 92.8 93.3 92.9  MCH 31.8 31.7 31.8  MCHC 34.3 33.9 34.3  RDW 14.1 14.0 14.2  PLT 176 181 173    Cardiac EnzymesNo results for input(s): TROPONINI in the last 168 hours. No results for input(s): TROPIPOC in the last 168 hours.   BNPNo results for input(s): BNP, PROBNP in the last 168 hours.    DDimer No results for input(s): DDIMER in the last 168 hours.  Lipid Panel  No results found for: CHOL, TRIG, HDL, CHOLHDL, VLDL, LDLCALC, LDLDIRECT   Radiology    Dg Abd Portable 1v  Result Date: 11/03/2016 CLINICAL DATA:  Small bowel obstruction.  Nausea. EXAM: PORTABLE ABDOMEN - 1 VIEW COMPARISON:  11/02/2016. FINDINGS: Marked distention of small bowel. Nasogastric tube coiled in the fundus of the stomach. Worsening bowel distention compared with yesterday's radiograph, with increased size and number of distended small bowel loops. No abnormal calcifications. IMPRESSION: Worsening small bowel obstruction. Electronically Signed   By: Elsie Stain M.D.   On: 11/03/2016 07:59   Dg Abd Portable 1v  Result Date: 11/02/2016 CLINICAL DATA:  Small bowel obstruction. EXAM: PORTABLE ABDOMEN - 1 VIEW COMPARISON:  Abdominal plain film dated 11/01/2016. FINDINGS: Persistent small bowel dilatation within the upper abdomen and central abdomen. Air and stool noted within the large bowel. No evidence of soft tissue mass or abnormal fluid collection. No evidence of free intraperitoneal air seen. Mild degenerative change within the lower lumbar spine. No acute or suspicious osseous finding. IMPRESSION: No significant interval change. Distended air-filled small bowel loops within the upper abdomen and central abdomen, compatible with given history of small bowel obstruction. Electronically Signed   By: Bary Richard M.D.   On: 11/02/2016 09:51    Cardiac Studies   Echo to be done this am  Patient Profile     56 yo male with PMH of AF, HTN, Chronic combined HF, CKD 3 who presented with abd pain, n/v and diagnosed with SBO. Cardiology asked to see for preop evaluation.   Assessment & Plan    1. AF with RVR:  Rate better but still increased.  Now on amiodarone infusion; Lopressor 5 mg iv q 4 hrs.  Will check echo after next iv lopressor dose. Will give an extra Lopressor 2.5 mg iv PRN if HR > 130.  On  lovenox sq; eliquis on hold.  If surgery is not necessary since pt had 2 BMs today will need to resume anticoagulation when ok by surgery.  2. Chronic systolic/diastolic heart failure.  To re-assess echo today.  Appears euvolemic on exam.   3  HTN: Once patient is able to take po meds will need to titrate meds and add new therapy.  4. SBO:  Appears to be improved with BM today;  For KUB this am.  5. 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

## 2016-11-04 NOTE — Care Management Note (Signed)
Case Management Note  Patient Details  Name: HANIF YANNUZZI MRN: [id number] Date of Birth: Jan 03, 1961  Subjective/Objective:    Adm w sbo, ng tube                Action/Plan: lives w wife, pcp dr Thurmond Butts   Expected Discharge Date:                  Expected Discharge Plan:  Home/Self Care  In-House Referral:     Discharge planning Services     Post Acute Care Choice:    Choice offered to:     DME Arranged:    DME Agency:     HH Arranged:    HH Agency:     Status of Service:  In process, will continue to follow  If discussed at Long Length of Stay Meetings, dates discussed:    Additional Comments: will moniter for dc planning as pt progresses.  Hanley Hays, RN 11/04/2016, 9:45 AM

## 2016-11-04 NOTE — Progress Notes (Signed)
PROGRESS NOTE                                                                                                                                                                                                             Patient Demographics:    Javier Berry, is a 56 y.o. male, DOB - [birth year], NWG:[id number]  Admit date - 10/31/2016   Admitting Physician Inez Catalina, MD  Outpatient Primary MD for the patient is [facility], MD  LOS - 3  Chief Complaint  Patient presents with  . Constipation  . Abdominal Pain  . Emesis       Brief Narrative   Javier Berry is a 56 y.o. male with medical history significant of Atrial fibrillation, HTN, PUD who presents with abdominal pain, nausea and vomiting for 3 days, he was diagnosed with small bowel obstruction admitted on 11/01/2016.   Subjective:    Javier Berry today has, No headache, No chest pain, No abdominal pain - had 2 BMs 11/04/16, No new weakness tingling or numbness, No Cough - SOB.    Assessment  & Plan :     1. SBO obstruction. No previous abdominal surgeries, never had surveillance colonoscopy. Currently has NG tube, nothing by mouth, IV fluids, pain-free with mild nausea, surgery consulted and following, He had 2 bowel movements on 11/04/2016, KUB still shows small bowel obstruction, question if he has now partial small bowel obstruction, surgery has been informed they will evaluate again this evening.  2. Chronic A. fib currently in RVR. [place] vasc 2 score of 2 -  Eliquis on hold, continues to be in RVR will initiate amiodarone bolus with infusion, have given IV digoxin load on 11/04/2016, scheduled IV Lopressor, we'll try to minimize Cardizem exposure due to depressed EF, as needed IV Lopressor and try to titrate off Cardizem drip as soon as possible. Follows with Dr. Jens Som, have consulted cardiology as well.  3. 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 Currently compensated. IV fluids with caution and monitor. No rales or shortness of breath, pulse ox stable will monitor. Cardiology on board  4. Hypertension. Lopressor and Cardizem as above and monitor.    Family Communication  :  None  Code Status :  Full  Diet : Diet NPO time specified   Disposition Plan  :  Tele  Consults  :  [facility] cardiology  Procedures  :    TTE - Left ventricle: The cavity size was normal. Wall thickness was increased in a pattern of mild LVH. Systolic function was moderately to severely reduced. The estimated ejection fraction was in the range of 30% to 35%. Diffuse hypokinesis. The study is not technically sufficient to allow evaluation of LV diastolic function.  -  Mitral valve: There was mild regurgitation. - Left atrium: The atrium was severely dilated. - Right atrium: The atrium was severely dilated. - Tricuspid valve: There was moderate regurgitation. - Pulmonary arteries: Systolic pressure was mildly increased. PA peak pressure: 41 mm Hg (S). - Pericardium, extracardiac: A trivial pericardial effusion was  identified posterior to the heart.   CT scan abdomen and pelvis -  showing small bowel obstruction with transition point around the ileum.  DVT Prophylaxis  :  Lovenox    Lab Results  Component Value Date   PLT 173 11/04/2016    Inpatient Medications  Scheduled Meds: . cefOXitin  2 g Intravenous To SS-Surg  . enoxaparin (LOVENOX) injection  90 mg Subcutaneous BID  . mouth rinse  15 mL Mouth Rinse BID  . metoprolol  5 mg Intravenous Q4H  . nitroGLYCERIN  0.5 inch Topical Q6H  . white petrolatum       Continuous Infusions: . 0.9 % NaCl with KCl 40 mEq / L 50 mL/hr at 11/04/16 0800  . amiodarone 30 mg/hr (11/04/16 1128)  . diltiazem (CARDIZEM) infusion 10 mg/hr (11/04/16 0800)   PRN Meds:.albuterol, diltiazem, hydrALAZINE, metoprolol, morphine injection, ondansetron (ZOFRAN) IV  Antibiotics  :    Anti-infectives    Start      Dose/Rate Route Frequency Ordered Stop   11/04/16 1400  cefOXitin (MEFOXIN) 2 g in dextrose 5 % 50 mL IVPB     2 g 100 mL/hr over 30 Minutes Intravenous To ShortStay Surgical 11/03/16 1623 11/05/16 1400         Objective:   Vitals:   11/04/16 0900 11/04/16 1000 11/04/16 1100 11/04/16 1140  BP: (!) 135/110 (!) 154/107 (!) 142/119 (!) 140/103  Pulse: (!) 128 (!) 44 64 (!) 129  Resp:      Temp:    97.8 F (36.6 C)  TempSrc:    Oral  SpO2: 93% 95% 96% 96%  Weight:      Height:        Wt Readings from Last 3 Encounters:  11/04/16 90.3 kg (199 lb)  03/27/15 99.8 kg (220 lb)  03/22/15 99.8 kg (220 lb)     Intake/Output Summary (Last 24 hours) at 11/04/16 1141 Last data filed at 11/04/16 0800  Gross per 24 hour  Intake          2243.58 ml  Output             1600 ml  Net           643.58 ml     Physical Exam  Awake Alert, Oriented X 3, No new F.N deficits, Normal affect [place].AT,PERRAL Supple Neck,No JVD, No cervical lymphadenopathy appriciated.  Symmetrical Chest wall movement, Good air movement bilaterally, CTAB RRR,No Gallops,Rubs or new Murmurs, No Parasternal Heave Hypoactive B.Sounds, NG in place, Abd Soft, No tenderness, No organomegaly appriciated, No rebound - guarding or rigidity. No Cyanosis, Clubbing or edema, No new Rash or bruise       Data Review:    CBC  Recent Labs Lab 10/31/16 2036 11/01/16 4098 11/02/16 0407 11/03/16 0229  11/04/16 0230  WBC 12.3* 11.1* 9.7 8.2 6.1  HGB 17.6* 15.6 15.9 15.2 14.9  HCT 50.1 44.1 46.4 44.8 43.5  PLT 196 177 176 181 173  MCV 91.4 91.5 92.8 93.3 92.9  MCH 32.1 32.4 31.8 31.7 31.8  MCHC 35.1 35.4 34.3 33.9 34.3  RDW 13.7 13.8 14.1 14.0 14.2    Chemistries   Recent Labs Lab 10/31/16 2036 11/01/16 0632 11/02/16 0407 11/03/16 0229 11/04/16 0230  NA 139 140 140 139 141  K 4.8 4.2 4.5 4.2 4.4  CL 102 106 107 105 106  CO2 26 26 26 25 26   GLUCOSE 152* 122* 105* 99 104*  BUN 10 9 17 17 16   CREATININE  1.38* 1.33* 1.38* 1.37* 1.36*  CALCIUM 10.1 9.2 9.0 9.0 8.9  MG  --   --  1.8  --  2.1  AST 33 25 24  --   --   ALT 22 19 16*  --   --   ALKPHOS 42 34* 31*  --   --   BILITOT 2.7* 2.3* 2.3*  --   --    ------------------------------------------------------------------------------------------------------------------ No results for input(s): CHOL, HDL, LDLCALC, TRIG, CHOLHDL, LDLDIRECT in the last 72 hours.  No results found for: HGBA1C ------------------------------------------------------------------------------------------------------------------ No results for input(s): TSH, T4TOTAL, T3FREE, THYROIDAB in the last 72 hours.  Invalid input(s): FREET3 ------------------------------------------------------------------------------------------------------------------ No results for input(s): VITAMINB12, FOLATE, FERRITIN, TIBC, IRON, RETICCTPCT in the last 72 hours.  Coagulation profile No results for input(s): INR, PROTIME in the last 168 hours.  No results for input(s): DDIMER in the last 72 hours.  Cardiac Enzymes No results for input(s): CKMB, TROPONINI, MYOGLOBIN in the last 168 hours.  Invalid input(s): CK ------------------------------------------------------------------------------------------------------------------    Component Value Date/Time   BNP 378.1 (H) 03/22/2015 1646    Micro Results Recent Results (from the past 240 hour(s))  MRSA PCR Screening     Status: None   Collection Time: 11/02/16  5:15 AM  Result Value Ref Range Status   MRSA by PCR NEGATIVE NEGATIVE Final    Comment:        The GeneXpert MRSA Assay (FDA approved for NASAL specimens only), is one component of a comprehensive MRSA colonization surveillance program. It is not intended to diagnose MRSA infection nor to guide or monitor treatment for MRSA infections.     Radiology Reports Ct Abdomen Pelvis W Contrast  Result Date: 11/01/2016 CLINICAL DATA:  Periumbilical pain with episodes of  emesis for 2 days. EXAM: CT ABDOMEN AND PELVIS WITH CONTRAST TECHNIQUE: Multidetector CT imaging of the abdomen and pelvis was performed using the standard protocol following bolus administration of intravenous contrast. CONTRAST:  ISOVUE-300 IOPAMIDOL (ISOVUE-300) INJECTION 61% COMPARISON:  None. FINDINGS: Lower chest: No acute abnormality. Hepatobiliary: No focal liver abnormality is seen. No gallstones, gallbladder wall thickening, or biliary dilatation. Pancreas: Unremarkable. No pancreatic ductal dilatation or surrounding inflammatory changes. Spleen: Normal in size without focal abnormality. Adrenals/Urinary Tract: Adrenal glands are unremarkable. Kidneys are normal, without renal calculi, focal lesion, or hydronephrosis. Bladder is unremarkable. Stomach/Bowel: Dilatation of the stomach and small bowel with abrupt transition to decompressed ileum. At the transition point in the right lower quadrant, no mass or hernia is evident. Colon is decompressed. Appendix is normal. Vascular/Lymphatic: The abdominal aorta is normal in caliber with mild atherosclerotic calcification. Reproductive: Unremarkable Other: Small volume ascites, perihepatic and in the dependent pelvis. Musculoskeletal: No significant skeletal lesion. IMPRESSION: Dilatation of small bowel with caliber transition in the ileum, consistent with  a small bowel obstruction. No mass or hernia is evident at the transition point. Small volume ascites. Electronically Signed   By: Ellery Plunk M.D.   On: 11/01/2016 00:00   Dg Abd Portable 1v  Result Date: 11/04/2016 CLINICAL DATA:  Followup small bowel obstruction. EXAM: PORTABLE ABDOMEN - 1 VIEW COMPARISON:  Yesterday FINDINGS: Nasogastric tube remains coiled in the proximal stomach. Persistent small bowel distension out of proportion to colonic gas. The degree of small bowel distension is borderline improved, with loops measuring up to 5 cm in the left abdomen. There is increased gas in the  right and transverse colon. No concerning mass effect or calcification. IMPRESSION: Small bowel obstruction. Small bowel distention persists but there is favorable increase in colonic gas. Electronically Signed   By: Marnee Spring M.D.   On: 11/04/2016 09:12   Dg Abd Portable 1v  Result Date: 11/03/2016 CLINICAL DATA:  Small bowel obstruction.  Nausea. EXAM: PORTABLE ABDOMEN - 1 VIEW COMPARISON:  11/02/2016. FINDINGS: Marked distention of small bowel. Nasogastric tube coiled in the fundus of the stomach. Worsening bowel distention compared with yesterday's radiograph, with increased size and number of distended small bowel loops. No abnormal calcifications. IMPRESSION: Worsening small bowel obstruction. Electronically Signed   By: Elsie Stain M.D.   On: 11/03/2016 07:59   Dg Abd Portable 1v  Result Date: 11/02/2016 CLINICAL DATA:  Small bowel obstruction. EXAM: PORTABLE ABDOMEN - 1 VIEW COMPARISON:  Abdominal plain film dated 11/01/2016. FINDINGS: Persistent small bowel dilatation within the upper abdomen and central abdomen. Air and stool noted within the large bowel. No evidence of soft tissue mass or abnormal fluid collection. No evidence of free intraperitoneal air seen. Mild degenerative change within the lower lumbar spine. No acute or suspicious osseous finding. IMPRESSION: No significant interval change. Distended air-filled small bowel loops within the upper abdomen and central abdomen, compatible with given history of small bowel obstruction. Electronically Signed   By: Bary Richard M.D.   On: 11/02/2016 09:51   Dg Abd Portable 1v-small Bowel Obstruction Protocol-initial, 8 Hr Delay  Result Date: 11/01/2016 CLINICAL DATA:  Small-bowel obstruction, 8 hour delay EXAM: PORTABLE ABDOMEN - 1 VIEW COMPARISON:  11/01/2016 FINDINGS: Persistent small bowel dilatation is identified. The overall appearance is similar to that seen on the prior exam. No definitive free air is noted. Nasogastric catheter  remains in the stomach. No significant contrast material is noted. Degenerative changes of the lumbar spine are noted. IMPRESSION: Nasogastric catheter within the stomach. Changes consistent with small bowel obstruction stable from the prior exam. Electronically Signed   By: Alcide Clever M.D.   On: 11/01/2016 20:54   Dg Abd Portable 1 View  Result Date: 11/01/2016 CLINICAL DATA:  Nasogastric tube placement. Follow-up small bowel obstruction. EXAM: PORTABLE ABDOMEN - 1 VIEW COMPARISON:  CT abdomen and pelvis October 31, 2016 FINDINGS: Nasogastric tube tip and side-port project in proximal stomach. Multiple loops of gas distended small bowel measuring to 4.6 cm. No intra-abdominal mass effect or pathologic calcifications. IMPRESSION: Nasogastric tube tip projects in proximal stomach. Electronically Signed   By: Awilda Metro M.D.   On: 11/01/2016 03:03    Time Spent in minutes  30   SINGH,PRASHANT K M.D on 11/04/2016 at 11:41 AM  Between 7am to 7pm - Pager - [phone]  After 7pm go to [email or web address] - password [facility]  [facility] -  Office  [phone]    '

## 2016-11-04 NOTE — Progress Notes (Signed)
  Echocardiogram 2D Echocardiogram has been performed.  Arvil Chaco 11/04/2016, 10:10 AM

## 2016-11-04 NOTE — Progress Notes (Signed)
[facility] Progress Note  Day of Surgery  Subjective: Pt feeling good. Had 2 BM's second one large. Having flatus. No nausea. Been eating ice chips and could be the source of his high NGT output. Fluid in canister is clear today.  Objective: Vital signs in last 24 hours: Temp:  [97.4 F (36.3 C)-98 F (36.7 C)] 97.8 F (36.6 C) (02/06 1140) Pulse Rate:  [28-150] 129 (02/06 1140) Resp:  [15] 15 (02/06 0000) BP: (114-154)/(84-119) 140/103 (02/06 1140) SpO2:  [93 %-98 %] 96 % (02/06 1140) Weight:  [199 lb (90.3 kg)] 199 lb (90.3 kg) (02/06 0337) Last BM Date: 10/29/16  Intake/Output from previous day: 02/05 0701 - 02/06 0700 In: 2224.9 [I.V.:2224.9] Out: 1950 [Urine:950; Emesis/NG output:1000] Intake/Output this shift: Total I/O In: 76.7 [I.V.:76.7] Out: 150 [Emesis/NG output:150]  PE: Gen:  Alert, NAD, pleasant, cooperative, lying in bed Card: tachycardia, irregular rhythm Pulm:  Rate and effort normal Abd: Soft, nondistended, nontender, +BS, no abdominal scars noted Skin: not diaphoretic, warm and dry  Lab Results:   Recent Labs  11/03/16 0229 11/04/16 0230  WBC 8.2 6.1  HGB 15.2 14.9  HCT 44.8 43.5  PLT 181 173   BMET  Recent Labs  11/03/16 0229 11/04/16 0230  NA 139 141  K 4.2 4.4  CL 105 106  CO2 25 26  GLUCOSE 99 104*  BUN 17 16  CREATININE 1.37* 1.36*  CALCIUM 9.0 8.9   PT/INR No results for input(s): LABPROT, INR in the last 72 hours. CMP     Component Value Date/Time   NA 141 11/04/2016 0230   K 4.4 11/04/2016 0230   CL 106 11/04/2016 0230   CO2 26 11/04/2016 0230   GLUCOSE 104 (H) 11/04/2016 0230   BUN 16 11/04/2016 0230   CREATININE 1.36 (H) 11/04/2016 0230   CREATININE 1.60 (H) 03/22/2015 1646   CALCIUM 8.9 11/04/2016 0230   PROT 5.4 (L) 11/02/2016 0407   ALBUMIN 3.1 (L) 11/02/2016 0407   AST 24 11/02/2016 0407   ALT 16 (L) 11/02/2016 0407   ALKPHOS 31 (L) 11/02/2016 0407   BILITOT 2.3 (H) 11/02/2016 0407   GFRNONAA 56 (L) 11/04/2016 0230   GFRAA >60 11/04/2016 0230   Lipase     Component Value Date/Time   LIPASE 14 10/31/2016 2036       Studies/Results: Dg Abd Portable 1v  Result Date: 11/04/2016 CLINICAL DATA:  Followup small bowel obstruction. EXAM: PORTABLE ABDOMEN - 1 VIEW COMPARISON:  Yesterday FINDINGS: Nasogastric tube remains coiled in the proximal stomach. Persistent small bowel distension out of proportion to colonic gas. The degree of small bowel distension is borderline improved, with loops measuring up to 5 cm in the left abdomen. There is increased gas in the right and transverse colon. No concerning mass effect or calcification. IMPRESSION: Small bowel obstruction. Small bowel distention persists but there is favorable increase in colonic gas. Electronically Signed   By: Marnee Spring M.D.   On: 11/04/2016 09:12   Dg Abd Portable 1v  Result Date: 11/03/2016 CLINICAL DATA:  Small bowel obstruction.  Nausea. EXAM: PORTABLE ABDOMEN - 1 VIEW COMPARISON:  11/02/2016. FINDINGS: Marked distention of small bowel. Nasogastric tube coiled in the fundus of the stomach. Worsening bowel distention compared with yesterday's radiograph, with increased size and number of distended small bowel loops. No abnormal calcifications. IMPRESSION: Worsening small bowel obstruction. Electronically Signed   By: Elsie Stain M.D.   On: 11/03/2016 07:59    Anti-infectives: Anti-infectives  Start     Dose/Rate Route Frequency Ordered Stop   11/04/16 1400  cefOXitin (MEFOXIN) 2 g in dextrose 5 % 50 mL IVPB     2 g 100 mL/hr over 30 Minutes Intravenous To ShortStay Surgical 11/03/16 1623 11/05/16 1400       Assessment/Plan  SBO - No history of abdominal surgery  - DG abdomen today shows favorable increase in colonic gas  Plan: Exploratory laparotomy was scheduled for today but pt had 2 BM's. Surgery cancelled. Will clamp tube to see how pt tolerates. If he tolerated tube clamping will give him  clears tonight and if no nausea or vomiting will pull tube in the am.   We will continue to follow.    LOS: 3 days    Jerre Simon , [facility] 11/04/2016, 12:19 PM Pager: [phone] Consults: [phone] Mon-Fri 7:00 am-4:30 pm Sat-Sun 7:00 am-11:30 am

## 2016-11-05 DIAGNOSIS — I429 Cardiomyopathy, unspecified: Secondary | ICD-10-CM

## 2016-11-05 LAB — BASIC METABOLIC PANEL
Anion gap: 12 (ref 5–15)
BUN: 10 mg/dL (ref 6–20)
CHLORIDE: 102 mmol/L (ref 101–111)
CO2: 23 mmol/L (ref 22–32)
CREATININE: 1.32 mg/dL — AB (ref 0.61–1.24)
Calcium: 8.7 mg/dL — ABNORMAL LOW (ref 8.9–10.3)
GFR calc Af Amer: >60 mL/min (ref >60)
GFR calc non Af Amer: 58 mL/min — ABNORMAL LOW (ref >60)
Glucose, Bld: 86 mg/dL (ref 65–99)
POTASSIUM: 4.2 mmol/L (ref 3.5–5.1)
Sodium: 137 mmol/L (ref 135–145)

## 2016-11-05 LAB — CBC
HEMATOCRIT: 46.7 % (ref 39.0–52.0)
HEMOGLOBIN: 16.2 g/dL (ref 13.0–17.0)
MCH: 32.1 pg (ref 26.0–34.0)
MCHC: 34.7 g/dL (ref 30.0–36.0)
MCV: 92.7 fL (ref 78.0–100.0)
Platelets: 164 10*3/uL (ref 150–400)
RBC: 5.04 MIL/uL (ref 4.22–5.81)
RDW: 13.6 % (ref 11.5–15.5)
WBC: 6.4 10*3/uL (ref 4.0–10.5)

## 2016-11-05 MED ORDER — METOPROLOL TARTRATE 50 MG PO TABS: 50.0000 mg | ORAL_TABLET | Freq: Two times a day (BID) | ORAL | Status: DC

## 2016-11-05 MED ORDER — SACUBITRIL-VALSARTAN 24-26 MG PO TABS
1.0000 | ORAL_TABLET | Freq: Two times a day (BID) | ORAL | Status: DC
  Administered 2016-11-05 – 2016-11-08 (×7): 1 via ORAL
  Filled 2016-11-05 (×7): qty 1

## 2016-11-05 MED ORDER — APIXABAN 5 MG PO TABS
5.0000 mg | ORAL_TABLET | Freq: Two times a day (BID) | ORAL | Status: DC
  Administered 2016-11-05 – 2016-11-08 (×6): 5 mg via ORAL
  Filled 2016-11-05 (×6): qty 1

## 2016-11-05 MED ORDER — METOPROLOL TARTRATE 50 MG PO TABS
75.0000 mg | ORAL_TABLET | Freq: Two times a day (BID) | ORAL | Status: DC
  Administered 2016-11-05 (×2): 75 mg via ORAL
  Filled 2016-11-05 (×2): qty 1

## 2016-11-05 NOTE — Progress Notes (Addendum)
Progress Note  Patient Name: Javier Berry Date of Encounter: 11/05/2016  Primary Cardiologist: Dr. Jens Som  Subjective   Feels much better today; had BM x2. Yesterday and another BM today.  Tolerating clear liquids.  Inpatient Medications    Scheduled Meds: . cefOXitin  2 g Intravenous To SS-Surg  . digoxin  0.125 mg Oral Daily  . enoxaparin (LOVENOX) injection  90 mg Subcutaneous BID  . mouth rinse  15 mL Mouth Rinse BID  . metoprolol tartrate  100 mg Oral BID  . nitroGLYCERIN  0.5 inch Topical Q6H   Continuous Infusions: . 0.9 % NaCl with KCl 40 mEq / L 50 mL/hr at 11/04/16 2100  . amiodarone 30 mg/hr (11/05/16 0123)  . diltiazem (CARDIZEM) infusion 5 mg/hr (11/05/16 0526)   PRN Meds: albuterol, diltiazem, hydrALAZINE, metoprolol, metoprolol, morphine injection, ondansetron (ZOFRAN) IV   Vital Signs    Vitals:   11/04/16 1900 11/04/16 1930 11/04/16 2352 11/05/16 0408  BP: (!) 133/102 (!) 134/106 (!) 137/106 (!) 153/109  Pulse: 76 (!) 56 (!) 54 (!) 130  Resp:      Temp:  98.1 F (36.7 C) 98.2 F (36.8 C) 98 F (36.7 C)  TempSrc:  Oral Oral Oral  SpO2: 96% 97% 95% 92%  Weight:    202 lb 9.6 oz (91.9 kg)  Height:        Intake/Output Summary (Last 24 hours) at 11/05/16 0806 Last data filed at 11/05/16 0600  Gross per 24 hour  Intake           2337.4 ml  Output             2100 ml  Net            237.4 ml    I/O since admission: +750.6  Filed Weights   11/03/16 0359 11/04/16 0337 11/05/16 0408  Weight: 203 lb 9.6 oz (92.4 kg) 199 lb (90.3 kg) 202 lb 9.6 oz (91.9 kg)    Telemetry    AF 114 - 125 - Personally Reviewed  ECG    11/04/2016 ECG (independently read by me): AF at 121; LAD; ST changes I,aVL,V3-6  11/01/2016 ECG (independently read by me): AF at 131; LAD, T wave inversion I, aVL, V3-6  Physical Exam   BP (!) 153/109 (BP Location: Left Arm)   Pulse (!) 130   Temp 98 F (36.7 C) (Oral)   Resp 15   Ht 6' (1.829 m)   Wt 202 lb 9.6 oz  (91.9 kg)   SpO2 92%   BMI 27.48 kg/m  General: Alert, oriented, no distress.  Skin: normal turgor, no rashes, warm and dry HEENT: Normocephalic, atraumatic. Pupils equal round and reactive to light; sclera anicteric; extraocular muscles intact;  Nose without nasal septal hypertrophy Mouth/Parynx benign; Mallinpatti scale 3 Neck: No JVD, no carotid bruits; normal carotid upstroke Lungs: clear to ausculatation and percussion; no wheezing or rales Chest wall: without tenderness to palpitation Heart: PMI not displaced, RRR, s1 s2 normal, 1/6 systolic murmur, no diastolic murmur, no rubs, gallops, thrills, or heaves Abdomen: resolution of prior tenderness;  soft; no hepatosplenomehaly, BS+; abdominal aorta nontender and not dilated by palpation. Back: no CVA tenderness Pulses 2+ Musculoskeletal: full range of motion, normal strength, no joint deformities Extremities: no clubbing cyanosis or edema, Homan's sign negative  Neurologic: grossly nonfocal; Cranial nerves grossly wnl Psychologic: Normal mood and affect    Labs    Chemistry  Recent Labs Lab 10/31/16 2036 11/01/16 6045 11/02/16  0407 11/03/16 0229 11/04/16 0230 11/05/16 0240  NA 139 140 140 139 141 137  K 4.8 4.2 4.5 4.2 4.4 4.2  CL 102 106 107 105 106 102  CO2 26 26 26 25 26 23   GLUCOSE 152* 122* 105* 99 104* 86  BUN 10 9 17 17 16 10   CREATININE 1.38* 1.33* 1.38* 1.37* 1.36* 1.32*  CALCIUM 10.1 9.2 9.0 9.0 8.9 8.7*  PROT 6.8 5.7* 5.4*  --   --   --   ALBUMIN 3.9 3.1* 3.1*  --   --   --   AST 33 25 24  --   --   --   ALT 22 19 16*  --   --   --   ALKPHOS 42 34* 31*  --   --   --   BILITOT 2.7* 2.3* 2.3*  --   --   --   GFRNONAA 55* 58* 55* 56* 56* 58*  GFRAA >60 >60 >60 >60 >60 >60  ANIONGAP 11 8 7 9 9 12      Hematology  Recent Labs Lab 11/03/16 0229 11/04/16 0230 11/05/16 0240  WBC 8.2 6.1 6.4  RBC 4.80 4.68 5.04  HGB 15.2 14.9 16.2  HCT 44.8 43.5 46.7  MCV 93.3 92.9 92.7  MCH 31.7 31.8 32.1  MCHC  33.9 34.3 34.7  RDW 14.0 14.2 13.6  PLT 181 173 164    Cardiac EnzymesNo results for input(s): TROPONINI in the last 168 hours. No results for input(s): TROPIPOC in the last 168 hours.   BNPNo results for input(s): BNP, PROBNP in the last 168 hours.   DDimer No results for input(s): DDIMER in the last 168 hours.  Lipid Panel  No results found for: CHOL, TRIG, HDL, CHOLHDL, VLDL, LDLCALC, LDLDIRECT   Radiology    Dg Abd Portable 1v  Result Date: 11/04/2016 CLINICAL DATA:  Followup small bowel obstruction. EXAM: PORTABLE ABDOMEN - 1 VIEW COMPARISON:  Yesterday FINDINGS: Nasogastric tube remains coiled in the proximal stomach. Persistent small bowel distension out of proportion to colonic gas. The degree of small bowel distension is borderline improved, with loops measuring up to 5 cm in the left abdomen. There is increased gas in the right and transverse colon. No concerning mass effect or calcification. IMPRESSION: Small bowel obstruction. Small bowel distention persists but there is favorable increase in colonic gas. Electronically Signed   By: Marnee Spring M.D.   On: 11/04/2016 09:12    Cardiac Studies   ECHO 11/04/16 Study Conclusions  - Left ventricle: The cavity size was normal. Wall thickness was   increased in a pattern of mild LVH. Systolic function was   moderately to severely reduced. The estimated ejection fraction   was in the range of 30% to 35%. Diffuse hypokinesis. The study is   not technically sufficient to allow evaluation of LV diastolic   function. - Mitral valve: There was mild regurgitation. - Left atrium: The atrium was severely dilated. - Right atrium: The atrium was severely dilated. - Tricuspid valve: There was moderate regurgitation. - Pulmonary arteries: Systolic pressure was mildly increased. PA   peak pressure: 41 mm Hg (S). - Pericardium, extracardiac: A trivial pericardial effusion was   identified posterior to the heart.  Patient Profile       56 yo male with PMH of AF, HTN, Chronic combined HF, CKD 3 who presented with abd pain, n/v and diagnosed with SBO. Cardiology asked to see for preop evaluation.   Assessment & Plan  1. AF with RVR:  Rate better but still increased at 123. Surgery so far has been cancelled. Will transition to lopressor 75 mg every 12 hours today ( was on Toprol 100 daily PTA) and dc iv lopressor.  Will continue amiodarone today but if chronic AF will probably dc and further titrate BB.   2. Anticoagulation: was on eliquis PTA.  Now on lovenox full dose.  To transition to possible full liquids today per surgery.  If he remains stable as diet advances, will probably resume eliquis tomorrow.   2. Chronic systolic/diastolic heart failure.  Echo re-confirms EF 30 - 35%.  Appears euvolemic on exam. He was on losartan PTA.  Will start entresto 24/26 today with ultimate titration as outpatient.   3  HTN: BP increased today; to start lopressor and entresto.   4. SBO:  Appears to be resolved with additional  BM today;  Diet advancement per surgery.  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

## 2016-11-05 NOTE — Progress Notes (Signed)
1 Day Post-Op  Subjective: Tolerating clears, no abdominal discomfort.  + BS and + Bm  No prior abdominal surgeries.  Objective: Vital signs in last 24 hours: Temp:  [97.8 F (36.6 C)-98.2 F (36.8 C)] 98 F (36.7 C) (02/07 0408) Pulse Rate:  [54-138] 130 (02/07 0408) BP: (127-154)/(98-128) 153/109 (02/07 0408) SpO2:  [92 %-100 %] 92 % (02/07 0408) Weight:  [91.9 kg (202 lb 9.6 oz)] 91.9 kg (202 lb 9.6 oz) (02/07 0408) Last BM Date: 11/04/16 640 PO 1774 IV Urine 2050 BM x 3 Afebrile, VSS HR up to 130 range at times Creatinine 1/32, stable CBC is normal No film today  Intake/Output from previous day: 02/06 0701 - 02/07 0700 In: 2414.1 [P.O.:640; I.V.:1774.1] Out: 2250 [Urine:2050; Emesis/NG output:200] Intake/Output this shift: No intake/output data recorded.  General appearance: alert, cooperative and no distress GI: soft, non-tender; bowel sounds normal; no masses,  no organomegaly  Lab Results:   Recent Labs  11/04/16 0230 11/05/16 0240  WBC 6.1 6.4  HGB 14.9 16.2  HCT 43.5 46.7  PLT 173 164    BMET  Recent Labs  11/04/16 0230 11/05/16 0240  NA 141 137  K 4.4 4.2  CL 106 102  CO2 26 23  GLUCOSE 104* 86  BUN 16 10  CREATININE 1.36* 1.32*  CALCIUM 8.9 8.7*   PT/INR No results for input(s): LABPROT, INR in the last 72 hours.   Recent Labs Lab 10/31/16 2036 11/01/16 0632 11/02/16 0407  AST 33 25 24  ALT 22 19 16*  ALKPHOS 42 34* 31*  BILITOT 2.7* 2.3* 2.3*  PROT 6.8 5.7* 5.4*  ALBUMIN 3.9 3.1* 3.1*     Lipase     Component Value Date/Time   LIPASE 14 10/31/2016 2036     Studies/Results: Dg Abd Portable 1v  Result Date: 11/04/2016 CLINICAL DATA:  Followup small bowel obstruction. EXAM: PORTABLE ABDOMEN - 1 VIEW COMPARISON:  Yesterday FINDINGS: Nasogastric tube remains coiled in the proximal stomach. Persistent small bowel distension out of proportion to colonic gas. The degree of small bowel distension is borderline improved, with  loops measuring up to 5 cm in the left abdomen. There is increased gas in the right and transverse colon. No concerning mass effect or calcification. IMPRESSION: Small bowel obstruction. Small bowel distention persists but there is favorable increase in colonic gas. Electronically Signed   By: Marnee Spring M.D.   On: 11/04/2016 09:12    Medications: . cefOXitin  2 g Intravenous To SS-Surg  . digoxin  0.125 mg Oral Daily  . enoxaparin (LOVENOX) injection  90 mg Subcutaneous BID  . mouth rinse  15 mL Mouth Rinse BID  . metoprolol tartrate  100 mg Oral BID  . nitroGLYCERIN  0.5 inch Topical Q6H   . 0.9 % NaCl with KCl 40 mEq / L 50 mL/hr at 11/04/16 2100  . amiodarone 30 mg/hr (11/05/16 0123)  . 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  BM x 3 yesterday.  No hx of surgery or constipation.  On full Lovenox for AF, will let him stay on this today.  Advance to full liquids and if OK Dr. Tresa Endo will resume Eliquis tomorrow.  He is restarting PO cardiac meds today also.  LOS: 4 days    Javier Berry 11/05/2016 [phone]

## 2016-11-05 NOTE — Progress Notes (Addendum)
PROGRESS NOTE                                                                                                                                                                                                             Patient Demographics:    Javier Berry, is a 56 y.o. male, DOB - July 10, 1961, ZHY:[id number]  Admit date - 10/31/2016   Admitting Physician Inez Catalina, MD  Outpatient Primary MD for the patient is [facility], MD  LOS - 4  Chief Complaint  Patient presents with  . Constipation  . Abdominal Pain  . Emesis       Brief Narrative   Javier Berry is a 56 y.o. male with medical history significant of Atrial fibrillation, HTN, PUD who presents with abdominal pain, nausea and vomiting for 3 days, he was diagnosed with small bowel obstruction admitted on 11/01/2016.   Subjective:    Javier Berry today has, No headache, No chest pain, No abdominal pain - had 2 BMs 11/04/16, No Javier weakness tingling or numbness, No Cough - SOB.    Assessment  & Plan :     1. SBO obstruction. No previous abdominal surgeries, never had surveillance colonoscopy. Was treated conservatively, reason still unclear surgery consulted and following. Now having bowel movements and SBO has clinically resolved, general surgery on board, advancing diet.  2. Chronic A. fib currently in RVR. [place] vasc 2 score of 2 -  Eliquis resumed, continues to be in RVR Have placed him on amiodarone bolus with infusion, have given IV digoxin load on 11/04/2016 and now on maintenance oral, have placed him on high-dose of oral Lopressor along with as needed IV Lopressor, we are trying not to use Cardizem due to depressed EF. Cardiology has been consulted and following, I discussed the case with cardiologist Dr. Tresa Endo on 11/05/2016. He will review and adjust medications as needed.  3. 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 Currently compensated. IV  fluids with caution and monitor. No rales or shortness of breath, pulse ox stable will monitor. Cardiology on board, they have placed him on & Entresto. Will monitor blood pressure.  4. Hypertension. 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  5. CKD 4 - baseline creatinine 13 to 1.5, monitor.   Family Communication  :  None  Code Status :  Full  Diet : Diet full liquid Room service appropriate? Yes; Fluid consistency: Thin   Disposition Plan  :  Tele  Consults  :  [facility] cardiology  Procedures  :    TTE - Left ventricle: The cavity size was normal. Wall thickness was increased in a pattern of mild LVH. Systolic function was moderately to severely reduced. The estimated ejection fraction was in the range of 30% to 35%. Diffuse hypokinesis. The study is not technically sufficient to allow evaluation of LV diastolic function.  -  Mitral valve: There was mild regurgitation. - Left atrium: The atrium was severely dilated. - Right atrium: The atrium was severely dilated. - Tricuspid valve: There was moderate regurgitation. - Pulmonary arteries: Systolic pressure was mildly increased. PA peak pressure: 41 mm Hg (S). - Pericardium, extracardiac: A trivial pericardial effusion was  identified posterior to the heart.   CT scan abdomen and pelvis -  showing small bowel obstruction with transition point around the ileum.  DVT Prophylaxis  :  Lovenox    Lab Results  Component Value Date   PLT 164 11/05/2016    Inpatient Medications  Scheduled Meds: . cefOXitin  2 g Intravenous To SS-Surg  . digoxin  0.125 mg Oral Daily  . enoxaparin (LOVENOX) injection  90 mg Subcutaneous BID  . mouth rinse  15 mL Mouth Rinse BID  . metoprolol tartrate  75 mg Oral BID  . sacubitril-valsartan  1 tablet Oral BID   Continuous Infusions: . 0.9 % NaCl with KCl 40 mEq / L 50 mL/hr at 11/04/16 2100  . amiodarone 30 mg/hr (11/05/16 0910)  . diltiazem (CARDIZEM) infusion 5 mg/hr (11/05/16 0526)    PRN Meds:.albuterol, diltiazem, hydrALAZINE, metoprolol, metoprolol, morphine injection, ondansetron (ZOFRAN) IV  Antibiotics  :    Anti-infectives    Start     Dose/Rate Route Frequency Ordered Stop   11/04/16 1400  cefOXitin (MEFOXIN) 2 g in dextrose 5 % 50 mL IVPB     2 g 100 mL/hr over 30 Minutes Intravenous To [facility] 11/03/16 1623 11/05/16 1400         Objective:   Vitals:   11/04/16 1930 11/04/16 2352 11/05/16 0408 11/05/16 0708  BP: (!) 134/106 (!) 137/106 (!) 153/109 (!) 141/102  Pulse: (!) 56 (!) 54 (!) 130 (!) 109  Resp:      Temp: 98.1 F (36.7 C) 98.2 F (36.8 C) 98 F (36.7 C) 98.1 F (36.7 C)  TempSrc: Oral Oral Oral Oral  SpO2: 97% 95% 92% 94%  Weight:   91.9 kg (202 lb 9.6 oz)   Height:        Wt Readings from Last 3 Encounters:  11/05/16 91.9 kg (202 lb 9.6 oz)  03/27/15 99.8 kg (220 lb)  03/22/15 99.8 kg (220 lb)     Intake/Output Summary (Last 24 hours) at 11/05/16 1016 Last data filed at 11/05/16 0700  Gross per 24 hour  Intake             2184 ml  Output             2200 ml  Net              -16 ml     Physical Exam  Awake Alert, Oriented X 3, No Javier F.N deficits, Normal affect Javier Berry,PERRAL Supple Neck,No JVD, No cervical lymphadenopathy appriciated.  Symmetrical Chest wall movement, Good air movement bilaterally, CTAB RRR,No Gallops,Rubs or Javier Murmurs, No Parasternal Heave Hypoactive B.Sounds, NG in  place, Abd Soft, No tenderness, No organomegaly appriciated, No rebound - guarding or rigidity. No Cyanosis, Clubbing or edema, No Javier Rash or bruise       Data Review:    CBC  Recent Labs Lab 11/01/16 0632 11/02/16 0407 11/03/16 0229 11/04/16 0230 11/05/16 0240  WBC 11.1* 9.7 8.2 6.1 6.4  HGB 15.6 15.9 15.2 14.9 16.2  HCT 44.1 46.4 44.8 43.5 46.7  PLT 177 176 181 173 164  MCV 91.5 92.8 93.3 92.9 92.7  MCH 32.4 31.8 31.7 31.8 32.1  MCHC 35.4 34.3 33.9 34.3 34.7  RDW 13.8 14.1 14.0 14.2 13.6     Chemistries   Recent Labs Lab 10/31/16 2036 11/01/16 0632 11/02/16 0407 11/03/16 0229 11/04/16 0230 11/05/16 0240  NA 139 140 140 139 141 137  K 4.8 4.2 4.5 4.2 4.4 4.2  CL 102 106 107 105 106 102  CO2 26 26 26 25 26 23   GLUCOSE 152* 122* 105* 99 104* 86  BUN 10 9 17 17 16 10   CREATININE 1.38* 1.33* 1.38* 1.37* 1.36* 1.32*  CALCIUM 10.1 9.2 9.0 9.0 8.9 8.7*  MG  --   --  1.8  --  2.1  --   AST 33 25 24  --   --   --   ALT 22 19 16*  --   --   --   ALKPHOS 42 34* 31*  --   --   --   BILITOT 2.7* 2.3* 2.3*  --   --   --    ------------------------------------------------------------------------------------------------------------------ No results for input(s): CHOL, HDL, LDLCALC, TRIG, CHOLHDL, LDLDIRECT in the last 72 hours.  No results found for: HGBA1C ------------------------------------------------------------------------------------------------------------------ No results for input(s): TSH, T4TOTAL, T3FREE, THYROIDAB in the last 72 hours.  Invalid input(s): FREET3 ------------------------------------------------------------------------------------------------------------------ No results for input(s): VITAMINB12, FOLATE, FERRITIN, TIBC, IRON, RETICCTPCT in the last 72 hours.  Coagulation profile No results for input(s): INR, PROTIME in the last 168 hours.  No results for input(s): DDIMER in the last 72 hours.  Cardiac Enzymes No results for input(s): CKMB, TROPONINI, MYOGLOBIN in the last 168 hours.  Invalid input(s): CK ------------------------------------------------------------------------------------------------------------------    Component Value Date/Time   BNP 378.1 (H) 03/22/2015 1646    Micro Results Recent Results (from the past 240 hour(s))  MRSA PCR Screening     Status: None   Collection Time: 11/02/16  5:15 AM  Result Value Ref Range Status   MRSA by PCR NEGATIVE NEGATIVE Final    Comment:        The GeneXpert MRSA Assay  (FDA approved for NASAL specimens only), is one component of a comprehensive MRSA colonization surveillance program. It is not intended to diagnose MRSA infection nor to guide or monitor treatment for MRSA infections.     Radiology Reports Ct Abdomen Pelvis W Contrast  Result Date: 11/01/2016 CLINICAL DATA:  Periumbilical pain with episodes of emesis for 2 days. EXAM: CT ABDOMEN AND PELVIS WITH CONTRAST TECHNIQUE: Multidetector CT imaging of the abdomen and pelvis was performed using the standard protocol following bolus administration of intravenous contrast. CONTRAST:  ISOVUE-300 IOPAMIDOL (ISOVUE-300) INJECTION 61% COMPARISON:  None. FINDINGS: Lower chest: No acute abnormality. Hepatobiliary: No focal liver abnormality is seen. No gallstones, gallbladder wall thickening, or biliary dilatation. Pancreas: Unremarkable. No pancreatic ductal dilatation or surrounding inflammatory changes. Spleen: Normal in size without focal abnormality. Adrenals/Urinary Tract: Adrenal glands are unremarkable. Kidneys are normal, without renal calculi, focal lesion, or hydronephrosis. Bladder is unremarkable. Stomach/Bowel: Dilatation of the  stomach and small bowel with abrupt transition to decompressed ileum. At the transition point in the right lower quadrant, no mass or hernia is evident. Colon is decompressed. Appendix is normal. Vascular/Lymphatic: The abdominal aorta is normal in caliber with mild atherosclerotic calcification. Reproductive: Unremarkable Other: Small volume ascites, perihepatic and in the dependent pelvis. Musculoskeletal: No significant skeletal lesion. IMPRESSION: Dilatation of small bowel with caliber transition in the ileum, consistent with a small bowel obstruction. No mass or hernia is evident at the transition point. Small volume ascites. Electronically Signed   By: Ellery Plunk M.D.   On: 11/01/2016 00:00   Dg Abd Portable 1v  Result Date: 11/04/2016 CLINICAL DATA:  Followup  small bowel obstruction. EXAM: PORTABLE ABDOMEN - 1 VIEW COMPARISON:  Yesterday FINDINGS: Nasogastric tube remains coiled in the proximal stomach. Persistent small bowel distension out of proportion to colonic gas. The degree of small bowel distension is borderline improved, with loops measuring up to 5 cm in the left abdomen. There is increased gas in the right and transverse colon. No concerning mass effect or calcification. IMPRESSION: Small bowel obstruction. Small bowel distention persists but there is favorable increase in colonic gas. Electronically Signed   By: Marnee Spring M.D.   On: 11/04/2016 09:12   Dg Abd Portable 1v  Result Date: 11/03/2016 CLINICAL DATA:  Small bowel obstruction.  Nausea. EXAM: PORTABLE ABDOMEN - 1 VIEW COMPARISON:  11/02/2016. FINDINGS: Marked distention of small bowel. Nasogastric tube coiled in the fundus of the stomach. Worsening bowel distention compared with yesterday's radiograph, with increased size and number of distended small bowel loops. No abnormal calcifications. IMPRESSION: Worsening small bowel obstruction. Electronically Signed   By: Elsie Stain M.D.   On: 11/03/2016 07:59   Dg Abd Portable 1v  Result Date: 11/02/2016 CLINICAL DATA:  Small bowel obstruction. EXAM: PORTABLE ABDOMEN - 1 VIEW COMPARISON:  Abdominal plain film dated 11/01/2016. FINDINGS: Persistent small bowel dilatation within the upper abdomen and central abdomen. Air and stool noted within the large bowel. No evidence of soft tissue mass or abnormal fluid collection. No evidence of free intraperitoneal air seen. Mild degenerative change within the lower lumbar spine. No acute or suspicious osseous finding. IMPRESSION: No significant interval change. Distended air-filled small bowel loops within the upper abdomen and central abdomen, compatible with given history of small bowel obstruction. Electronically Signed   By: Bary Richard M.D.   On: 11/02/2016 09:51   Dg Abd Portable 1v-small  Bowel Obstruction Protocol-initial, 8 Hr Delay  Result Date: 11/01/2016 CLINICAL DATA:  Small-bowel obstruction, 8 hour delay EXAM: PORTABLE ABDOMEN - 1 VIEW COMPARISON:  11/01/2016 FINDINGS: Persistent small bowel dilatation is identified. The overall appearance is similar to that seen on the prior exam. No definitive free air is noted. Nasogastric catheter remains in the stomach. No significant contrast material is noted. Degenerative changes of the lumbar spine are noted. IMPRESSION: Nasogastric catheter within the stomach. Changes consistent with small bowel obstruction stable from the prior exam. Electronically Signed   By: Alcide Clever M.D.   On: 11/01/2016 20:54   Dg Abd Portable 1 View  Result Date: 11/01/2016 CLINICAL DATA:  Nasogastric tube placement. Follow-up small bowel obstruction. EXAM: PORTABLE ABDOMEN - 1 VIEW COMPARISON:  CT abdomen and pelvis October 31, 2016 FINDINGS: Nasogastric tube tip and side-port project in proximal stomach. Multiple loops of gas distended small bowel measuring to 4.6 cm. No intra-abdominal mass effect or pathologic calcifications. IMPRESSION: Nasogastric tube tip projects in proximal stomach. Electronically Signed  By: Awilda Metro M.D.   On: 11/01/2016 03:03    Time Spent in minutes  30   SINGH,PRASHANT K M.D on 11/05/2016 at 10:16 AM  Between 7am to 7pm - Pager - [phone]  After 7pm go to [email or web address] - password [facility]  [facility] -  Office  [phone]

## 2016-11-05 NOTE — Progress Notes (Signed)
Pt came out of bathroom and said his NG tube just came out and that he didn't even feel it. Pt VS stable and alert.

## 2016-11-05 NOTE — Progress Notes (Signed)
ANTICOAGULATION CONSULT NOTE  Pharmacy Consult for apixiban Indication: atrial fibrillation  No Known Allergies  Patient Measurements: Height: 6' (182.9 cm) Weight: 202 lb 9.6 oz (91.9 kg) IBW/kg (Calculated) : 77.6  Vital Signs: Temp: 98.1 F (36.7 C) (02/07 1100) Temp Source: Oral (02/07 1100) BP: 112/63 (02/07 1100) Pulse Rate: 64 (02/07 1100)  Labs:  Recent Labs  11/03/16 0229 11/04/16 0230 11/05/16 0240  HGB 15.2 14.9 16.2  HCT 44.8 43.5 46.7  PLT 181 173 164  CREATININE 1.37* 1.36* 1.32*    Estimated Creatinine Clearance: 67.8 mL/min (by C-G formula based on SCr of 1.32 mg/dL (H)).   Medical History: Past Medical History:  Diagnosis Date  . Atrial fibrillation (HCC)   . Essential hypertension   . Gout   . PUD (peptic ulcer disease)     Assessment: 56 y.o. male admitted with abdominal pain/SBO, h/o Afib and Eliquis has been on hold (SBO obstruction( and he has been on lovenox. Pharmacy to resume apixiban today -Last lovenox dose given at ~ 9am today -SCr= 1.32, CrCl ~ 70  Goal of Therapy:  Monitor platelets by anticoagulation protocol: Yes   Plan: -Resume apixiban at 5mg  po bid tonight -Will follow patient progress  Harland German, Pharm D 11/05/2016 12:05 PM

## 2016-11-06 ENCOUNTER — Inpatient Hospital Stay (HOSPITAL_COMMUNITY): Payer: BC Managed Care – PPO

## 2016-11-06 LAB — OSMOLALITY, URINE: Osmolality, Ur: 447 mOsm/kg (ref 300–900)

## 2016-11-06 LAB — BASIC METABOLIC PANEL
ANION GAP: 7 (ref 5–15)
BUN: 5 mg/dL — ABNORMAL LOW (ref 6–20)
CO2: 30 mmol/L (ref 22–32)
CREATININE: 1.53 mg/dL — AB (ref 0.61–1.24)
Calcium: 8.8 mg/dL — ABNORMAL LOW (ref 8.9–10.3)
Chloride: 102 mmol/L (ref 101–111)
GFR calc non Af Amer: 49 mL/min — ABNORMAL LOW (ref >60)
GFR, EST AFRICAN AMERICAN: 57 mL/min — AB (ref >60)
Glucose, Bld: 102 mg/dL — ABNORMAL HIGH (ref 65–99)
Potassium: 4.7 mmol/L (ref 3.5–5.1)
Sodium: 139 mmol/L (ref 135–145)

## 2016-11-06 LAB — MAGNESIUM: Magnesium: 1.8 mg/dL (ref 1.7–2.4)

## 2016-11-06 LAB — CREATININE, URINE, RANDOM: Creatinine, Urine: 279.92 mg/dL

## 2016-11-06 LAB — SODIUM, URINE, RANDOM: Sodium, Ur: 71 mmol/L

## 2016-11-06 LAB — OSMOLALITY: OSMOLALITY: 294 mosm/kg (ref 275–295)

## 2016-11-06 MED ORDER — METOPROLOL TARTRATE 50 MG PO TABS
75.0000 mg | ORAL_TABLET | Freq: Three times a day (TID) | ORAL | Status: DC
  Administered 2016-11-06 – 2016-11-07 (×4): 75 mg via ORAL
  Filled 2016-11-06 (×4): qty 1

## 2016-11-06 MED ORDER — METOPROLOL TARTRATE 50 MG PO TABS: 75.0000 mg | ORAL_TABLET | Freq: Three times a day (TID) | ORAL | Status: DC

## 2016-11-06 MED ORDER — SODIUM CHLORIDE 0.9 % IV SOLN
INTRAVENOUS | Status: DC
  Administered 2016-11-06 – 2016-11-07 (×2): via INTRAVENOUS

## 2016-11-06 NOTE — Progress Notes (Signed)
PROGRESS NOTE                                                                                                                                                                                                             Patient Demographics:    Javier Berry, is a 56 y.o. male, DOB - Jun 29, 1961, ZOX:[id number]  Admit date - 10/31/2016   Admitting Physician Inez Catalina, MD  Outpatient Primary MD for the patient is [facility], MD  LOS - 5  Chief Complaint  Patient presents with  . Constipation  . Abdominal Pain  . Emesis       Brief Narrative   Javier Berry is a 56 y.o. male with medical history significant of Atrial fibrillation, HTN, PUD who presents with abdominal pain, nausea and vomiting for 3 days, he was diagnosed with small bowel obstruction admitted on 11/01/2016.   Subjective:    Javier Berry today has, No headache, No chest pain, No abdominal pain - had 2 BMs 11/04/16, No new weakness tingling or numbness, No Cough - SOB.    Assessment  & Plan :     1. SBO obstruction. No previous abdominal surgeries, never had surveillance colonoscopy. Was treated conservatively, reason still unclear surgery consulted and following. Now having bowel movements and SBO has clinically resolved, general surgery on board, advancing diet.  2. Chronic A. fib currently in RVR. [place] vasc 2 score of 2 -  Eliquis resumed, continues to be in RVR Have placed him on amiodarone bolus with infusion, have given IV digoxin load on 11/04/2016 and now on maintenance oral, have placed him on high-dose of oral Lopressor along with as needed IV Lopressor, we are trying not to use Cardizem due to depressed EF. Cardiology has been consulted and following, I discussed the case with cardiologist Dr. Tresa Endo on 11/05/2016. Neurology is managing A. fib now.  3. 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 Currently compensated. IV fluids with  caution and monitor. No rales or shortness of breath, pulse ox stable will monitor. Cardiology on board, they have placed him on & Entresto. Will monitor blood pressure.  4. Hypertension. 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  5. CKD 4 - baseline creatinine 13 to 1.5, monitor closely on Entresto.   Family Communication  :  None  Code Status :  Full  Diet : DIET SOFT Room service appropriate? Yes; Fluid consistency: Thin   Disposition Plan  :  Tele  Consults  :  [facility] cardiology  Procedures  :    TTE - Left ventricle: The cavity size was normal. Wall thickness was increased in a pattern of mild LVH. Systolic function was moderately to severely reduced. The estimated ejection fraction was in the range of 30% to 35%. Diffuse hypokinesis. The study is not technically sufficient to allow evaluation of LV diastolic function.  -  Mitral valve: There was mild regurgitation. - Left atrium: The atrium was severely dilated. - Right atrium: The atrium was severely dilated. - Tricuspid valve: There was moderate regurgitation. - Pulmonary arteries: Systolic pressure was mildly increased. PA peak pressure: 41 mm Hg (S). - Pericardium, extracardiac: A trivial pericardial effusion was  identified posterior to the heart.   CT scan abdomen and pelvis -  showing small bowel obstruction with transition point around the ileum.  DVT Prophylaxis  :  Lovenox    Lab Results  Component Value Date   PLT 164 11/05/2016    Inpatient Medications  Scheduled Meds: . apixaban  5 mg Oral BID  . digoxin  0.125 mg Oral Daily  . mouth rinse  15 mL Mouth Rinse BID  . metoprolol tartrate  75 mg Oral Q8H  . sacubitril-valsartan  1 tablet Oral BID   Continuous Infusions: . sodium chloride 50 mL/hr at 11/06/16 0944  . amiodarone 30 mg/hr (11/06/16 0028)   PRN Meds:.albuterol, hydrALAZINE, metoprolol, metoprolol, morphine injection, ondansetron (ZOFRAN) IV  Antibiotics  :    Anti-infectives     Start     Dose/Rate Route Frequency Ordered Stop   11/04/16 1400  cefOXitin (MEFOXIN) 2 g in dextrose 5 % 50 mL IVPB     2 g 100 mL/hr over 30 Minutes Intravenous To [facility] 11/03/16 1623 11/05/16 1400         Objective:   Vitals:   11/06/16 0335 11/06/16 0400 11/06/16 0600 11/06/16 0800  BP: (!) 124/111 117/90  (!) 115/99  Pulse: (!) 57 70  (!) 52  Resp: 18   16  Temp: 97.6 F (36.4 C)   98.3 F (36.8 C)  TempSrc: Oral   Oral  SpO2: 100% 95%  95%  Weight:   88.4 kg (194 lb 14.4 oz)   Height:        Wt Readings from Last 3 Encounters:  11/06/16 88.4 kg (194 lb 14.4 oz)  03/27/15 99.8 kg (220 lb)  03/22/15 99.8 kg (220 lb)     Intake/Output Summary (Last 24 hours) at 11/06/16 1015 Last data filed at 11/06/16 0700  Gross per 24 hour  Intake             1685 ml  Output             1550 ml  Net              135 ml     Physical Exam  Awake Alert, Oriented X 3, No new F.N deficits, Normal affect [place].AT,PERRAL Supple Neck,No JVD, No cervical lymphadenopathy appriciated.  Symmetrical Chest wall movement, Good air movement bilaterally, CTAB RRR,No Gallops,Rubs or new Murmurs, No Parasternal Heave Hypoactive B.Sounds, NG in place, Abd Soft, No tenderness, No organomegaly appriciated, No rebound - guarding or rigidity. No Cyanosis, Clubbing or edema, No new Rash or bruise       Data Review:    CBC  Recent Labs  Lab 11/01/16 1610 11/02/16 0407 11/03/16 0229 11/04/16 0230 11/05/16 0240  WBC 11.1* 9.7 8.2 6.1 6.4  HGB 15.6 15.9 15.2 14.9 16.2  HCT 44.1 46.4 44.8 43.5 46.7  PLT 177 176 181 173 164  MCV 91.5 92.8 93.3 92.9 92.7  MCH 32.4 31.8 31.7 31.8 32.1  MCHC 35.4 34.3 33.9 34.3 34.7  RDW 13.8 14.1 14.0 14.2 13.6    Chemistries   Recent Labs Lab 10/31/16 2036 11/01/16 0632 11/02/16 0407 11/03/16 0229 11/04/16 0230 11/05/16 0240 11/06/16 0307  NA 139 140 140 139 141 137 139  K 4.8 4.2 4.5 4.2 4.4 4.2 4.7  CL 102 106 107 105 106 102 102   CO2 26 26 26 25 26 23 30   GLUCOSE 152* 122* 105* 99 104* 86 102*  BUN 10 9 17 17 16 10  5*  CREATININE 1.38* 1.33* 1.38* 1.37* 1.36* 1.32* 1.53*  CALCIUM 10.1 9.2 9.0 9.0 8.9 8.7* 8.8*  MG  --   --  1.8  --  2.1  --  1.8  AST 33 25 24  --   --   --   --   ALT 22 19 16*  --   --   --   --   ALKPHOS 42 34* 31*  --   --   --   --   BILITOT 2.7* 2.3* 2.3*  --   --   --   --    ------------------------------------------------------------------------------------------------------------------ No results for input(s): CHOL, HDL, LDLCALC, TRIG, CHOLHDL, LDLDIRECT in the last 72 hours.  No results found for: HGBA1C ------------------------------------------------------------------------------------------------------------------ No results for input(s): TSH, T4TOTAL, T3FREE, THYROIDAB in the last 72 hours.  Invalid input(s): FREET3 ------------------------------------------------------------------------------------------------------------------ No results for input(s): VITAMINB12, FOLATE, FERRITIN, TIBC, IRON, RETICCTPCT in the last 72 hours.  Coagulation profile No results for input(s): INR, PROTIME in the last 168 hours.  No results for input(s): DDIMER in the last 72 hours.  Cardiac Enzymes No results for input(s): CKMB, TROPONINI, MYOGLOBIN in the last 168 hours.  Invalid input(s): CK ------------------------------------------------------------------------------------------------------------------    Component Value Date/Time   BNP 378.1 (H) 03/22/2015 1646    Micro Results Recent Results (from the past 240 hour(s))  MRSA PCR Screening     Status: None   Collection Time: 11/02/16  5:15 AM  Result Value Ref Range Status   MRSA by PCR NEGATIVE NEGATIVE Final    Comment:        The GeneXpert MRSA Assay (FDA approved for NASAL specimens only), is one component of a comprehensive MRSA colonization surveillance program. It is not intended to diagnose MRSA infection nor to guide  or monitor treatment for MRSA infections.     Radiology Reports Ct Abdomen Pelvis W Contrast  Result Date: 11/01/2016 CLINICAL DATA:  Periumbilical pain with episodes of emesis for 2 days. EXAM: CT ABDOMEN AND PELVIS WITH CONTRAST TECHNIQUE: Multidetector CT imaging of the abdomen and pelvis was performed using the standard protocol following bolus administration of intravenous contrast. CONTRAST:  ISOVUE-300 IOPAMIDOL (ISOVUE-300) INJECTION 61% COMPARISON:  None. FINDINGS: Lower chest: No acute abnormality. Hepatobiliary: No focal liver abnormality is seen. No gallstones, gallbladder wall thickening, or biliary dilatation. Pancreas: Unremarkable. No pancreatic ductal dilatation or surrounding inflammatory changes. Spleen: Normal in size without focal abnormality. Adrenals/Urinary Tract: Adrenal glands are unremarkable. Kidneys are normal, without renal calculi, focal lesion, or hydronephrosis. Bladder is unremarkable. Stomach/Bowel: Dilatation of the stomach and small bowel with abrupt transition to decompressed ileum. At the transition point in the  right lower quadrant, no mass or hernia is evident. Colon is decompressed. Appendix is normal. Vascular/Lymphatic: The abdominal aorta is normal in caliber with mild atherosclerotic calcification. Reproductive: Unremarkable Other: Small volume ascites, perihepatic and in the dependent pelvis. Musculoskeletal: No significant skeletal lesion. IMPRESSION: Dilatation of small bowel with caliber transition in the ileum, consistent with a small bowel obstruction. No mass or hernia is evident at the transition point. Small volume ascites. Electronically Signed   By: Ellery Plunk M.D.   On: 11/01/2016 00:00   Dg Chest Port 1 View  Result Date: 11/06/2016 CLINICAL DATA:  Shortness of breath EXAM: PORTABLE CHEST 1 VIEW COMPARISON:  02/03/2015 FINDINGS: Cardiac enlargement. No vascular congestion. No edema or consolidation. No blunting of costophrenic angles.  No pneumothorax. Mediastinal contours appear intact. IMPRESSION: Cardiac enlargement.  No evidence of active pulmonary disease. Electronically Signed   By: Burman Nieves M.D.   On: 11/06/2016 06:38   Dg Abd Portable 1v  Result Date: 11/04/2016 CLINICAL DATA:  Followup small bowel obstruction. EXAM: PORTABLE ABDOMEN - 1 VIEW COMPARISON:  Yesterday FINDINGS: Nasogastric tube remains coiled in the proximal stomach. Persistent small bowel distension out of proportion to colonic gas. The degree of small bowel distension is borderline improved, with loops measuring up to 5 cm in the left abdomen. There is increased gas in the right and transverse colon. No concerning mass effect or calcification. IMPRESSION: Small bowel obstruction. Small bowel distention persists but there is favorable increase in colonic gas. Electronically Signed   By: Marnee Spring M.D.   On: 11/04/2016 09:12   Dg Abd Portable 1v  Result Date: 11/03/2016 CLINICAL DATA:  Small bowel obstruction.  Nausea. EXAM: PORTABLE ABDOMEN - 1 VIEW COMPARISON:  11/02/2016. FINDINGS: Marked distention of small bowel. Nasogastric tube coiled in the fundus of the stomach. Worsening bowel distention compared with yesterday's radiograph, with increased size and number of distended small bowel loops. No abnormal calcifications. IMPRESSION: Worsening small bowel obstruction. Electronically Signed   By: Elsie Stain M.D.   On: 11/03/2016 07:59   Dg Abd Portable 1v  Result Date: 11/02/2016 CLINICAL DATA:  Small bowel obstruction. EXAM: PORTABLE ABDOMEN - 1 VIEW COMPARISON:  Abdominal plain film dated 11/01/2016. FINDINGS: Persistent small bowel dilatation within the upper abdomen and central abdomen. Air and stool noted within the large bowel. No evidence of soft tissue mass or abnormal fluid collection. No evidence of free intraperitoneal air seen. Mild degenerative change within the lower lumbar spine. No acute or suspicious osseous finding. IMPRESSION: No  significant interval change. Distended air-filled small bowel loops within the upper abdomen and central abdomen, compatible with given history of small bowel obstruction. Electronically Signed   By: Bary Richard M.D.   On: 11/02/2016 09:51   Dg Abd Portable 1v-small Bowel Obstruction Protocol-initial, 8 Hr Delay  Result Date: 11/01/2016 CLINICAL DATA:  Small-bowel obstruction, 8 hour delay EXAM: PORTABLE ABDOMEN - 1 VIEW COMPARISON:  11/01/2016 FINDINGS: Persistent small bowel dilatation is identified. The overall appearance is similar to that seen on the prior exam. No definitive free air is noted. Nasogastric catheter remains in the stomach. No significant contrast material is noted. Degenerative changes of the lumbar spine are noted. IMPRESSION: Nasogastric catheter within the stomach. Changes consistent with small bowel obstruction stable from the prior exam. Electronically Signed   By: Alcide Clever M.D.   On: 11/01/2016 20:54   Dg Abd Portable 1 View  Result Date: 11/01/2016 CLINICAL DATA:  Nasogastric tube placement. Follow-up small bowel  obstruction. EXAM: PORTABLE ABDOMEN - 1 VIEW COMPARISON:  CT abdomen and pelvis October 31, 2016 FINDINGS: Nasogastric tube tip and side-port project in proximal stomach. Multiple loops of gas distended small bowel measuring to 4.6 cm. No intra-abdominal mass effect or pathologic calcifications. IMPRESSION: Nasogastric tube tip projects in proximal stomach. Electronically Signed   By: Awilda Metro M.D.   On: 11/01/2016 03:03    Time Spent in minutes  30   Ramonia Mcclaran K M.D on 11/06/2016 at 10:15 AM  Between 7am to 7pm - Pager - [phone]  After 7pm go to [email or web address] - password [facility]  [facility] -  Office  [phone]

## 2016-11-06 NOTE — Progress Notes (Signed)
2 Days Post-Op  Subjective: He is fine, tolerating full liquids and had a BM yesterday.  He is ready for soft diet.    Objective: Vital signs in last 24 hours: Temp:  [97.6 F (36.4 C)-98.3 F (36.8 C)] 98.3 F (36.8 C) (02/08 0800) Pulse Rate:  [40-131] 52 (02/08 0800) Resp:  [16-20] 16 (02/08 0800) BP: (112-127)/(63-111) 115/99 (02/08 0800) SpO2:  [93 %-100 %] 95 % (02/08 0800) Weight:  [88.4 kg (194 lb 14.4 oz)] 88.4 kg (194 lb 14.4 oz) (02/08 0600) Last BM Date: 11/05/16 PO not recorded Urine 1550 Afebrile, VSS HR still up, BP is still up CXR :  Ok Creatinine is up some, K+ 4.7 Intake/Output from previous day: 02/07 0701 - 02/08 0700 In: 1685 [I.V.:1685] Out: 1550 [Urine:1550] Intake/Output this shift: No intake/output data recorded.  General appearance: alert, cooperative and no distress GI: soft, non-tender; bowel sounds normal; no masses,  no organomegaly  Lab Results:   Recent Labs  11/04/16 0230 11/05/16 0240  WBC 6.1 6.4  HGB 14.9 16.2  HCT 43.5 46.7  PLT 173 164    BMET  Recent Labs  11/05/16 0240 11/06/16 0307  NA 137 139  K 4.2 4.7  CL 102 102  CO2 23 30  GLUCOSE 86 102*  BUN 10 5*  CREATININE 1.32* 1.53*  CALCIUM 8.7* 8.8*   PT/INR No results for input(s): LABPROT, INR in the last 72 hours.   Recent Labs Lab 10/31/16 2036 11/01/16 0632 11/02/16 0407  AST 33 25 24  ALT 22 19 16*  ALKPHOS 42 34* 31*  BILITOT 2.7* 2.3* 2.3*  PROT 6.8 5.7* 5.4*  ALBUMIN 3.9 3.1* 3.1*     Lipase     Component Value Date/Time   LIPASE 14 10/31/2016 2036     Studies/Results: Dg Chest Port 1 View  Result Date: 11/06/2016 CLINICAL DATA:  Shortness of breath EXAM: PORTABLE CHEST 1 VIEW COMPARISON:  02/03/2015 FINDINGS: Cardiac enlargement. No vascular congestion. No edema or consolidation. No blunting of costophrenic angles. No pneumothorax. Mediastinal contours appear intact. IMPRESSION: Cardiac enlargement.  No evidence of active pulmonary  disease. Electronically Signed   By: Burman Nieves M.D.   On: 11/06/2016 06:38    Medications: . apixaban  5 mg Oral BID  . digoxin  0.125 mg Oral Daily  . mouth rinse  15 mL Mouth Rinse BID  . metoprolol tartrate  75 mg Oral Q8H  . sacubitril-valsartan  1 tablet Oral BID    Assessment/Plan SBO Chronic AF with Eliquis anticoagulation Systolic and diastolic CHF - EF 35% Hypertrension FEN:  IV fluids/clears ID:  No antibiotics DVT:  Lovenox - full anticoagulation for AF =>> apixaban   Plan:  Soft diet, advance as tolerated. Call if we can be of further assist.   LOS: 5 days    Andreya Lacks 11/06/2016 [phone]

## 2016-11-06 NOTE — Progress Notes (Signed)
Progress Note  Patient Name: Javier Berry Date of Encounter: 11/06/2016  Primary Cardiologist: Dr. Jens Som  Subjective   Feels much better today; had BM x2. Yesterday and another BM today.  Tolerating clear liquids.  Inpatient Medications    Scheduled Meds: . apixaban  5 mg Oral BID  . digoxin  0.125 mg Oral Daily  . mouth rinse  15 mL Mouth Rinse BID  . metoprolol tartrate  75 mg Oral BID  . sacubitril-valsartan  1 tablet Oral BID   Continuous Infusions: . 0.9 % NaCl with KCl 40 mEq / L 75 mL/hr (11/06/16 0618)  . amiodarone 30 mg/hr (11/06/16 0028)   PRN Meds: albuterol, hydrALAZINE, metoprolol, metoprolol, morphine injection, ondansetron (ZOFRAN) IV   Vital Signs    Vitals:   11/05/16 2340 11/06/16 0335 11/06/16 0400 11/06/16 0600  BP: (!) 121/95 (!) 124/111 117/90   Pulse: 62 (!) 57 70   Resp: 19 18    Temp: 98 F (36.7 C) 97.6 F (36.4 C)    TempSrc: Oral Oral    SpO2: 98% 100% 95%   Weight:    194 lb 14.4 oz (88.4 kg)  Height:        Intake/Output Summary (Last 24 hours) at 11/06/16 0803 Last data filed at 11/06/16 0700  Gross per 24 hour  Intake             1685 ml  Output             1550 ml  Net              135 ml    I/O since admission: +585  Filed Weights   11/04/16 0337 11/05/16 0408 11/06/16 0600  Weight: 199 lb (90.3 kg) 202 lb 9.6 oz (91.9 kg) 194 lb 14.4 oz (88.4 kg)    Telemetry    AF 104 - 124 - Personally Reviewed  ECG   11/06/16 ECG (independently read by me): AF at 110; LAD; ST changes persist but less prominent  11/04/2016 ECG (independently read by me): AF at 121; LAD; ST changes I,aVL,V3-6  11/01/2016 ECG (independently read by me): AF at 131; LAD, T wave inversion I, aVL, V3-6  Physical Exam   BP 117/90   Pulse 70   Temp 97.6 F (36.4 C) (Oral)   Resp 18   Ht 6' (1.829 m)   Wt 194 lb 14.4 oz (88.4 kg)   SpO2 95%   BMI 26.43 kg/m  General: Alert, oriented, no distress.  Skin: normal turgor, no rashes, warm and  dry HEENT: Normocephalic, atraumatic. Pupils equal round and reactive to light; sclera anicteric; extraocular muscles intact;  Nose without nasal septal hypertrophy Mouth/Parynx benign; Mallinpatti scale 3 Neck: No JVD, no carotid bruits; normal carotid upstroke Lungs: clear to ausculatation and percussion; no wheezing or rales Chest wall: without tenderness to palpitation Heart: PMI not displaced, RRR, s1 s2 normal, 1/6 systolic murmur, no diastolic murmur, no rubs, gallops, thrills, or heaves Abdomen: resolution of prior tenderness;  soft; no hepatosplenomehaly, BS+; abdominal aorta nontender and not dilated by palpation. Back: no CVA tenderness Pulses 2+ Musculoskeletal: full range of motion, normal strength, no joint deformities Extremities: no clubbing cyanosis or edema, Homan's sign negative  Neurologic: grossly nonfocal; Cranial nerves grossly wnl Psychologic: Normal mood and affect    Labs    Chemistry  Recent Labs Lab 10/31/16 2036 11/01/16 1610 11/02/16 0407  11/04/16 0230 11/05/16 0240 11/06/16 0307  NA 139 140 140  < >  141 137 139  K 4.8 4.2 4.5  < > 4.4 4.2 4.7  CL 102 106 107  < > 106 102 102  CO2 26 26 26   < > 26 23 30   GLUCOSE 152* 122* 105*  < > 104* 86 102*  BUN 10 9 17   < > 16 10 5*  CREATININE 1.38* 1.33* 1.38*  < > 1.36* 1.32* 1.53*  CALCIUM 10.1 9.2 9.0  < > 8.9 8.7* 8.8*  PROT 6.8 5.7* 5.4*  --   --   --   --   ALBUMIN 3.9 3.1* 3.1*  --   --   --   --   AST 33 25 24  --   --   --   --   ALT 22 19 16*  --   --   --   --   ALKPHOS 42 34* 31*  --   --   --   --   BILITOT 2.7* 2.3* 2.3*  --   --   --   --   GFRNONAA 55* 58* 55*  < > 56* 58* 49*  GFRAA >60 >60 >60  < > >60 >60 57*  ANIONGAP 11 8 7   < > 9 12 7   < > = values in this interval not displayed.   Hematology  Recent Labs Lab 11/03/16 0229 11/04/16 0230 11/05/16 0240  WBC 8.2 6.1 6.4  RBC 4.80 4.68 5.04  HGB 15.2 14.9 16.2  HCT 44.8 43.5 46.7  MCV 93.3 92.9 92.7  MCH 31.7 31.8  32.1  MCHC 33.9 34.3 34.7  RDW 14.0 14.2 13.6  PLT 181 173 164    Cardiac EnzymesNo results for input(s): TROPONINI in the last 168 hours. No results for input(s): TROPIPOC in the last 168 hours.   BNPNo results for input(s): BNP, PROBNP in the last 168 hours.   DDimer No results for input(s): DDIMER in the last 168 hours.  Lipid Panel  No results found for: CHOL, TRIG, HDL, CHOLHDL, VLDL, LDLCALC, LDLDIRECT   Radiology    Dg Chest Port 1 View  Result Date: 11/06/2016 CLINICAL DATA:  Shortness of breath EXAM: PORTABLE CHEST 1 VIEW COMPARISON:  02/03/2015 FINDINGS: Cardiac enlargement. No vascular congestion. No edema or consolidation. No blunting of costophrenic angles. No pneumothorax. Mediastinal contours appear intact. IMPRESSION: Cardiac enlargement.  No evidence of active pulmonary disease. Electronically Signed   By: Burman Nieves M.D.   On: 11/06/2016 06:38   Dg Abd Portable 1v  Result Date: 11/04/2016 CLINICAL DATA:  Followup small bowel obstruction. EXAM: PORTABLE ABDOMEN - 1 VIEW COMPARISON:  Yesterday FINDINGS: Nasogastric tube remains coiled in the proximal stomach. Persistent small bowel distension out of proportion to colonic gas. The degree of small bowel distension is borderline improved, with loops measuring up to 5 cm in the left abdomen. There is increased gas in the right and transverse colon. No concerning mass effect or calcification. IMPRESSION: Small bowel obstruction. Small bowel distention persists but there is favorable increase in colonic gas. Electronically Signed   By: Marnee Spring M.D.   On: 11/04/2016 09:12    Cardiac Studies   ECHO 11/04/16 Study Conclusions  - Left ventricle: The cavity size was normal. Wall thickness was   increased in a pattern of mild LVH. Systolic function was   moderately to severely reduced. The estimated ejection fraction   was in the range of 30% to 35%. Diffuse hypokinesis. The study is   not technically sufficient to  allow evaluation of LV diastolic   function. - Mitral valve: There was mild regurgitation. - Left atrium: The atrium was severely dilated. - Right atrium: The atrium was severely dilated. - Tricuspid valve: There was moderate regurgitation. - Pulmonary arteries: Systolic pressure was mildly increased. PA   peak pressure: 41 mm Hg (S). - Pericardium, extracardiac: A trivial pericardial effusion was   identified posterior to the heart.  Patient Profile     56 yo male with PMH of AF, HTN, Chronic combined HF, CKD 3 who presented with abd pain, n/v and diagnosed with SBO. Cardiology asked to see for preop evaluation.   Assessment & Plan    1. AF with RVR:  Rate better today but still increased on amiodarone drip, lopressor 75 mg q 12 hrs.  Will increase frequency of lopressor to 75 mg q 8 hrs today. BP 115 - 120 range, entresto also started yesterday.   Will continue amiodarone today and probably dc tomorrow. Pt has chronic AF. If rate remains fast may need to start oral therapy. Continue digoxin 0.125 mg.   2. Anticoagulation: was on eliquis PTA. lovenox dc'd yesteray;  Now restarted with evening dose last night. No bleeding  2. Chronic systolic/diastolic heart failure.  Echo re-confirms EF 30 - 35%.  Appears euvolemic on exam. He was on losartan PTA. Entresto 24/26 today with ultimate titration as outpatient in several weeks to 49/51.  3  HTN: BP improved today now 115 - 120  4. SBO:  Appears to be resolved with additional  BMs.  ON full liquids ; diet advancement per surgery.  5. Stage 3 CKD:  Cr increased to 1.53 today; continue entresto and f/u renal fxn.    Signed, Lennette Bihari, MD, [facility] 11/06/2016, 8:03 AM

## 2016-11-07 LAB — BASIC METABOLIC PANEL
Anion gap: 7 (ref 5–15)
BUN: 10 mg/dL (ref 6–20)
CALCIUM: 8.5 mg/dL — AB (ref 8.9–10.3)
CO2: 26 mmol/L (ref 22–32)
CREATININE: 1.63 mg/dL — AB (ref 0.61–1.24)
Chloride: 106 mmol/L (ref 101–111)
GFR calc Af Amer: 52 mL/min — ABNORMAL LOW (ref >60)
GFR, EST NON AFRICAN AMERICAN: 45 mL/min — AB (ref >60)
GLUCOSE: 101 mg/dL — AB (ref 65–99)
POTASSIUM: 4.2 mmol/L (ref 3.5–5.1)
Sodium: 139 mmol/L (ref 135–145)

## 2016-11-07 MED ORDER — METOPROLOL TARTRATE 100 MG PO TABS
100.0000 mg | ORAL_TABLET | Freq: Three times a day (TID) | ORAL | Status: DC
  Administered 2016-11-07 – 2016-11-08 (×3): 100 mg via ORAL
  Filled 2016-11-07 (×2): qty 2
  Filled 2016-11-07: qty 1

## 2016-11-07 MED ORDER — AMIODARONE HCL 200 MG PO TABS
200.0000 mg | ORAL_TABLET | Freq: Two times a day (BID) | ORAL | Status: DC
  Administered 2016-11-07 – 2016-11-08 (×3): 200 mg via ORAL
  Filled 2016-11-07 (×3): qty 1

## 2016-11-07 NOTE — Progress Notes (Signed)
PROGRESS NOTE                                                                                                                                                                                                             Patient Demographics:    Javier Berry, is a 56 y.o. male, DOB - [birth year], UEA:[id number]  Admit date - 10/31/2016   Admitting Physician Inez Catalina, MD  Outpatient Primary MD for the patient is [facility], MD  LOS - 6  Chief Complaint  Patient presents with  . Constipation  . Abdominal Pain  . Emesis       Brief Narrative   Javier Berry is a 56 y.o. male with medical history significant of Atrial fibrillation, HTN, PUD who presents with abdominal pain, nausea and vomiting for 3 days, he was diagnosed with small bowel obstruction admitted on 11/01/2016.   Subjective:    [place] today has, No headache, No chest pain, No abdominal pain - had 2 BMs 11/04/16, No new weakness tingling or numbness, No Cough - SOB.    Assessment  & Plan :     1. SBO obstruction. No previous abdominal surgeries, never had surveillance colonoscopy. Was treated conservatively, reason still unclear surgery consulted and following. Now having bowel movements and SBO has clinically resolved, general surgery on board, advancing diet.  2. Chronic A. fib currently in RVR. [place] vasc 2 score of 2 -  Eliquis resumed, Remained in RVR hence he was started on amiodarone drip, digoxin and beta blocker rate better, will try to switch on oral amiodarone and follow, cardiology on board.  3. 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 Currently compensated. IV fluids with caution and monitor. No rales or shortness of breath, pulse ox stable will monitor. Cardiology on board, they have placed him on & Entresto. Will monitor blood pressure.  4. Hypertension. 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  5. CKD 4 - baseline  creatinine 13 to 1.5, monitor closely on Entresto.   Family Communication  :  None  Code Status :  Full  Diet : DIET SOFT Room service appropriate? Yes; Fluid consistency: Thin   Disposition Plan  : Home once heart rate under control  Consults  :  CCS, Cardiology  Procedures  :    TTE - Left ventricle:  The cavity size was normal. Wall thickness was increased in a pattern of mild LVH. Systolic function was moderately to severely reduced. The estimated ejection fraction was in the range of 30% to 35%. Diffuse hypokinesis. The study is not technically sufficient to allow evaluation of LV diastolic function.  -  Mitral valve: There was mild regurgitation. - Left atrium: The atrium was severely dilated. - Right atrium: The atrium was severely dilated. - Tricuspid valve: There was moderate regurgitation. - Pulmonary arteries: Systolic pressure was mildly increased. PA peak pressure: 41 mm Hg (S). - Pericardium, extracardiac: A trivial pericardial effusion was  identified posterior to the heart.   CT scan abdomen and pelvis -  showing small bowel obstruction with transition point around the ileum.  DVT Prophylaxis  :  Lovenox    Lab Results  Component Value Date   PLT 164 11/05/2016    Inpatient Medications  Scheduled Meds: . amiodarone  200 mg Oral BID  . apixaban  5 mg Oral BID  . digoxin  0.125 mg Oral Daily  . metoprolol tartrate  100 mg Oral Q8H  . sacubitril-valsartan  1 tablet Oral BID   Continuous Infusions:  PRN Meds:.albuterol, hydrALAZINE, metoprolol, metoprolol, morphine injection, ondansetron (ZOFRAN) IV  Antibiotics  :    Anti-infectives    Start     Dose/Rate Route Frequency Ordered Stop   11/04/16 1400  cefOXitin (MEFOXIN) 2 g in dextrose 5 % 50 mL IVPB     2 g 100 mL/hr over 30 Minutes Intravenous To [facility] 11/03/16 1623 11/05/16 1400         Objective:   Vitals:   11/07/16 0500 11/07/16 0800 11/07/16 0816 11/07/16 0916  BP: (!)  109/95 111/87 111/87   Pulse:  92 91 (!) 106  Resp:      Temp:   97.7 F (36.5 C)   TempSrc:   Oral   SpO2:  96% 97%   Weight:      Height:        Wt Readings from Last 3 Encounters:  11/07/16 90.4 kg (199 lb 4.7 oz)  03/27/15 99.8 kg (220 lb)  03/22/15 99.8 kg (220 lb)     Intake/Output Summary (Last 24 hours) at 11/07/16 1119 Last data filed at 11/07/16 1000  Gross per 24 hour  Intake          2004.23 ml  Output             1425 ml  Net           579.23 ml     Physical Exam  Awake Alert, Oriented X 3, No new F.N deficits, Normal affect [place].AT,PERRAL Supple Neck,No JVD, No cervical lymphadenopathy appriciated.  Symmetrical Chest wall movement, Good air movement bilaterally, CTAB RRR,No Gallops,Rubs or new Murmurs, No Parasternal Heave Hypoactive B.Sounds, NG in place, Abd Soft, No tenderness, No organomegaly appriciated, No rebound - guarding or rigidity. No Cyanosis, Clubbing or edema, No new Rash or bruise       Data Review:    CBC  Recent Labs Lab 11/01/16 0632 11/02/16 0407 11/03/16 0229 11/04/16 0230 11/05/16 0240  WBC 11.1* 9.7 8.2 6.1 6.4  HGB 15.6 15.9 15.2 14.9 16.2  HCT 44.1 46.4 44.8 43.5 46.7  PLT 177 176 181 173 164  MCV 91.5 92.8 93.3 92.9 92.7  MCH 32.4 31.8 31.7 31.8 32.1  MCHC 35.4 34.3 33.9 34.3 34.7  RDW 13.8 14.1 14.0 14.2 13.6    Chemistries  Recent Labs Lab 10/31/16 2036 11/01/16 6767 11/02/16 0407 11/03/16 0229 11/04/16 0230 11/05/16 0240 11/06/16 0307 11/07/16 0325  NA 139 140 140 139 141 137 139 139  K 4.8 4.2 4.5 4.2 4.4 4.2 4.7 4.2  CL 102 106 107 105 106 102 102 106  CO2 26 26 26 25 26 23 30 26   GLUCOSE 152* 122* 105* 99 104* 86 102* 101*  BUN 10 9 17 17 16 10  5* 10  CREATININE 1.38* 1.33* 1.38* 1.37* 1.36* 1.32* 1.53* 1.63*  CALCIUM 10.1 9.2 9.0 9.0 8.9 8.7* 8.8* 8.5*  MG  --   --  1.8  --  2.1  --  1.8  --   AST 33 25 24  --   --   --   --   --   ALT 22 19 16*  --   --   --   --   --   ALKPHOS 42 34* 31*   --   --   --   --   --   BILITOT 2.7* 2.3* 2.3*  --   --   --   --   --    ------------------------------------------------------------------------------------------------------------------ No results for input(s): CHOL, HDL, LDLCALC, TRIG, CHOLHDL, LDLDIRECT in the last 72 hours.  No results found for: HGBA1C ------------------------------------------------------------------------------------------------------------------ No results for input(s): TSH, T4TOTAL, T3FREE, THYROIDAB in the last 72 hours.  Invalid input(s): FREET3 ------------------------------------------------------------------------------------------------------------------ No results for input(s): VITAMINB12, FOLATE, FERRITIN, TIBC, IRON, RETICCTPCT in the last 72 hours.  Coagulation profile No results for input(s): INR, PROTIME in the last 168 hours.  No results for input(s): DDIMER in the last 72 hours.  Cardiac Enzymes No results for input(s): CKMB, TROPONINI, MYOGLOBIN in the last 168 hours.  Invalid input(s): CK ------------------------------------------------------------------------------------------------------------------    Component Value Date/Time   BNP 378.1 (H) 03/22/2015 1646    Micro Results Recent Results (from the past 240 hour(s))  MRSA PCR Screening     Status: None   Collection Time: 11/02/16  5:15 AM  Result Value Ref Range Status   MRSA by PCR NEGATIVE NEGATIVE Final    Comment:        The GeneXpert MRSA Assay (FDA approved for NASAL specimens only), is one component of a comprehensive MRSA colonization surveillance program. It is not intended to diagnose MRSA infection nor to guide or monitor treatment for MRSA infections.     Radiology Reports Ct Abdomen Pelvis W Contrast  Result Date: 11/01/2016 CLINICAL DATA:  Periumbilical pain with episodes of emesis for 2 days. EXAM: CT ABDOMEN AND PELVIS WITH CONTRAST TECHNIQUE: Multidetector CT imaging of the abdomen and pelvis was  performed using the standard protocol following bolus administration of intravenous contrast. CONTRAST:  ISOVUE-300 IOPAMIDOL (ISOVUE-300) INJECTION 61% COMPARISON:  None. FINDINGS: Lower chest: No acute abnormality. Hepatobiliary: No focal liver abnormality is seen. No gallstones, gallbladder wall thickening, or biliary dilatation. Pancreas: Unremarkable. No pancreatic ductal dilatation or surrounding inflammatory changes. Spleen: Normal in size without focal abnormality. Adrenals/Urinary Tract: Adrenal glands are unremarkable. Kidneys are normal, without renal calculi, focal lesion, or hydronephrosis. Bladder is unremarkable. Stomach/Bowel: Dilatation of the stomach and small bowel with abrupt transition to decompressed ileum. At the transition point in the right lower quadrant, no mass or hernia is evident. Colon is decompressed. Appendix is normal. Vascular/Lymphatic: The abdominal aorta is normal in caliber with mild atherosclerotic calcification. Reproductive: Unremarkable Other: Small volume ascites, perihepatic and in the dependent pelvis. Musculoskeletal: No significant skeletal lesion. IMPRESSION: Dilatation of small  bowel with caliber transition in the ileum, consistent with a small bowel obstruction. No mass or hernia is evident at the transition point. Small volume ascites. Electronically Signed   By: Ellery Plunk M.D.   On: 11/01/2016 00:00   Dg Chest Port 1 View  Result Date: 11/06/2016 CLINICAL DATA:  Shortness of breath EXAM: PORTABLE CHEST 1 VIEW COMPARISON:  02/03/2015 FINDINGS: Cardiac enlargement. No vascular congestion. No edema or consolidation. No blunting of costophrenic angles. No pneumothorax. Mediastinal contours appear intact. IMPRESSION: Cardiac enlargement.  No evidence of active pulmonary disease. Electronically Signed   By: Burman Nieves M.D.   On: 11/06/2016 06:38   Dg Abd Portable 1v  Result Date: 11/04/2016 CLINICAL DATA:  Followup small bowel obstruction.  EXAM: PORTABLE ABDOMEN - 1 VIEW COMPARISON:  Yesterday FINDINGS: Nasogastric tube remains coiled in the proximal stomach. Persistent small bowel distension out of proportion to colonic gas. The degree of small bowel distension is borderline improved, with loops measuring up to 5 cm in the left abdomen. There is increased gas in the right and transverse colon. No concerning mass effect or calcification. IMPRESSION: Small bowel obstruction. Small bowel distention persists but there is favorable increase in colonic gas. Electronically Signed   By: Marnee Spring M.D.   On: 11/04/2016 09:12   Dg Abd Portable 1v  Result Date: 11/03/2016 CLINICAL DATA:  Small bowel obstruction.  Nausea. EXAM: PORTABLE ABDOMEN - 1 VIEW COMPARISON:  11/02/2016. FINDINGS: Marked distention of small bowel. Nasogastric tube coiled in the fundus of the stomach. Worsening bowel distention compared with yesterday's radiograph, with increased size and number of distended small bowel loops. No abnormal calcifications. IMPRESSION: Worsening small bowel obstruction. Electronically Signed   By: Elsie Stain M.D.   On: 11/03/2016 07:59   Dg Abd Portable 1v  Result Date: 11/02/2016 CLINICAL DATA:  Small bowel obstruction. EXAM: PORTABLE ABDOMEN - 1 VIEW COMPARISON:  Abdominal plain film dated 11/01/2016. FINDINGS: Persistent small bowel dilatation within the upper abdomen and central abdomen. Air and stool noted within the large bowel. No evidence of soft tissue mass or abnormal fluid collection. No evidence of free intraperitoneal air seen. Mild degenerative change within the lower lumbar spine. No acute or suspicious osseous finding. IMPRESSION: No significant interval change. Distended air-filled small bowel loops within the upper abdomen and central abdomen, compatible with given history of small bowel obstruction. Electronically Signed   By: Bary Richard M.D.   On: 11/02/2016 09:51   Dg Abd Portable 1v-small Bowel Obstruction  Protocol-initial, 8 Hr Delay  Result Date: 11/01/2016 CLINICAL DATA:  Small-bowel obstruction, 8 hour delay EXAM: PORTABLE ABDOMEN - 1 VIEW COMPARISON:  11/01/2016 FINDINGS: Persistent small bowel dilatation is identified. The overall appearance is similar to that seen on the prior exam. No definitive free air is noted. Nasogastric catheter remains in the stomach. No significant contrast material is noted. Degenerative changes of the lumbar spine are noted. IMPRESSION: Nasogastric catheter within the stomach. Changes consistent with small bowel obstruction stable from the prior exam. Electronically Signed   By: Alcide Clever M.D.   On: 11/01/2016 20:54   Dg Abd Portable 1 View  Result Date: 11/01/2016 CLINICAL DATA:  Nasogastric tube placement. Follow-up small bowel obstruction. EXAM: PORTABLE ABDOMEN - 1 VIEW COMPARISON:  CT abdomen and pelvis October 31, 2016 FINDINGS: Nasogastric tube tip and side-port project in proximal stomach. Multiple loops of gas distended small bowel measuring to 4.6 cm. No intra-abdominal mass effect or pathologic calcifications. IMPRESSION: Nasogastric tube tip  projects in proximal stomach. Electronically Signed   By: Awilda Metro M.D.   On: 11/01/2016 03:03    Time Spent in minutes  30   Jonica Bickhart K M.D on 11/07/2016 at 11:19 AM  Between 7am to 7pm - Pager - [phone]  After 7pm go to [email or web address] - password [facility]  [facility] -  Office  [phone]

## 2016-11-07 NOTE — Plan of Care (Signed)
Problem: Bowel/Gastric: Goal: Will not experience complications related to bowel motility Outcome: Progressing Continues to have positive bowel sounds, regular flatus; had a BM 11/06/16. Patient denies any abdominal pain or tenderness.

## 2016-11-07 NOTE — Progress Notes (Signed)
Progress Note  Patient Name: Javier Berry Date of Encounter: 11/07/2016  Primary Cardiologist: Dr. Jens Som  Subjective   Feels much better today; tolerating diet advancement.  Inpatient Medications    Scheduled Meds: . apixaban  5 mg Oral BID  . digoxin  0.125 mg Oral Daily  . metoprolol tartrate  75 mg Oral Q8H  . sacubitril-valsartan  1 tablet Oral BID   Continuous Infusions: . sodium chloride 50 mL/hr at 11/07/16 0431  . amiodarone 30 mg/hr (11/07/16 0034)   PRN Meds: albuterol, hydrALAZINE, metoprolol, metoprolol, morphine injection, ondansetron (ZOFRAN) IV   Vital Signs    Vitals:   11/06/16 2300 11/07/16 0300 11/07/16 0500 11/07/16 0816  BP: (!) 108/94  (!) 109/95 111/87  Pulse: (!) 42 67  91  Resp:      Temp: 97.7 F (36.5 C) 97.5 F (36.4 C)  97.7 F (36.5 C)  TempSrc: Oral Oral  Oral  SpO2: 98% 99%  97%  Weight:  199 lb 4.7 oz (90.4 kg)    Height:        Intake/Output Summary (Last 24 hours) at 11/07/16 0832 Last data filed at 11/07/16 0700  Gross per 24 hour  Intake          2149.13 ml  Output             1325 ml  Net           824.13 ml    I/O since admission: +1409  Filed Weights   11/05/16 0408 11/06/16 0600 11/07/16 0300  Weight: 202 lb 9.6 oz (91.9 kg) 194 lb 14.4 oz (88.4 kg) 199 lb 4.7 oz (90.4 kg)    Telemetry    AF 85-105 - Personally Reviewed  ECG   11/06/16 ECG (independently read by me): AF at 110; LAD; ST changes persist but less prominent  11/04/2016 ECG (independently read by me): AF at 121; LAD; ST changes I,aVL,V3-6  11/01/2016 ECG (independently read by me): AF at 131; LAD, T wave inversion I, aVL, V3-6  Physical Exam   BP 111/87 (BP Location: Right Arm)   Pulse 91   Temp 97.7 F (36.5 C) (Oral)   Resp 18   Ht 6' (1.829 m)   Wt 199 lb 4.7 oz (90.4 kg)   SpO2 97%   BMI 27.03 kg/m  General: Alert, oriented, no distress.  Skin: normal turgor, no rashes, warm and dry HEENT: Normocephalic, atraumatic. Pupils  equal round and reactive to light; sclera anicteric; extraocular muscles intact;  Nose without nasal septal hypertrophy Mouth/Parynx benign; Mallinpatti scale 3 Neck: No JVD, no carotid bruits; normal carotid upstroke Lungs: clear to ausculatation and percussion; no wheezing or rales Chest wall: without tenderness to palpitation Heart: PMI not displaced, RRR, s1 s2 normal, 1/6 systolic murmur, no diastolic murmur, no rubs, gallops, thrills, or heaves Abdomen: resolution of prior tenderness;  soft; no hepatosplenomehaly, BS+; abdominal aorta nontender and not dilated by palpation. Back: no CVA tenderness Pulses 2+ Musculoskeletal: full range of motion, normal strength, no joint deformities Extremities: no clubbing cyanosis or edema, Homan's sign negative  Neurologic: grossly nonfocal; Cranial nerves grossly wnl Psychologic: Normal mood and affect    Labs    Chemistry  Recent Labs Lab 10/31/16 2036 11/01/16 6286 11/02/16 0407  11/05/16 0240 11/06/16 0307 11/07/16 0325  NA 139 140 140  < > 137 139 139  K 4.8 4.2 4.5  < > 4.2 4.7 4.2  CL 102 106 107  < > 102  102 106  CO2 26 26 26   < > 23 30 26   GLUCOSE 152* 122* 105*  < > 86 102* 101*  BUN 10 9 17   < > 10 5* 10  CREATININE 1.38* 1.33* 1.38*  < > 1.32* 1.53* 1.63*  CALCIUM 10.1 9.2 9.0  < > 8.7* 8.8* 8.5*  PROT 6.8 5.7* 5.4*  --   --   --   --   ALBUMIN 3.9 3.1* 3.1*  --   --   --   --   AST 33 25 24  --   --   --   --   ALT 22 19 16*  --   --   --   --   ALKPHOS 42 34* 31*  --   --   --   --   BILITOT 2.7* 2.3* 2.3*  --   --   --   --   GFRNONAA 55* 58* 55*  < > 58* 49* 45*  GFRAA >60 >60 >60  < > >60 57* 52*  ANIONGAP 11 8 7   < > 12 7 7   < > = values in this interval not displayed.   Hematology  Recent Labs Lab 11/03/16 0229 11/04/16 0230 11/05/16 0240  WBC 8.2 6.1 6.4  RBC 4.80 4.68 5.04  HGB 15.2 14.9 16.2  HCT 44.8 43.5 46.7  MCV 93.3 92.9 92.7  MCH 31.7 31.8 32.1  MCHC 33.9 34.3 34.7  RDW 14.0 14.2 13.6    PLT 181 173 164    Cardiac EnzymesNo results for input(s): TROPONINI in the last 168 hours. No results for input(s): TROPIPOC in the last 168 hours.   BNPNo results for input(s): BNP, PROBNP in the last 168 hours.   DDimer No results for input(s): DDIMER in the last 168 hours.  Lipid Panel  No results found for: CHOL, TRIG, HDL, CHOLHDL, VLDL, LDLCALC, LDLDIRECT   Radiology    Dg Chest Port 1 View  Result Date: 11/06/2016 CLINICAL DATA:  Shortness of breath EXAM: PORTABLE CHEST 1 VIEW COMPARISON:  02/03/2015 FINDINGS: Cardiac enlargement. No vascular congestion. No edema or consolidation. No blunting of costophrenic angles. No pneumothorax. Mediastinal contours appear intact. IMPRESSION: Cardiac enlargement.  No evidence of active pulmonary disease. Electronically Signed   By: Burman Nieves M.D.   On: 11/06/2016 06:38    Cardiac Studies   ECHO 11/04/16 Study Conclusions  - Left ventricle: The cavity size was normal. Wall thickness was   increased in a pattern of mild LVH. Systolic function was   moderately to severely reduced. The estimated ejection fraction   was in the range of 30% to 35%. Diffuse hypokinesis. The study is   not technically sufficient to allow evaluation of LV diastolic   function. - Mitral valve: There was mild regurgitation. - Left atrium: The atrium was severely dilated. - Right atrium: The atrium was severely dilated. - Tricuspid valve: There was moderate regurgitation. - Pulmonary arteries: Systolic pressure was mildly increased. PA   peak pressure: 41 mm Hg (S). - Pericardium, extracardiac: A trivial pericardial effusion was   identified posterior to the heart.  Patient Profile     56 yo male with PMH of AF, HTN, Chronic combined HF, CKD 3 who presented with abd pain, n/v and diagnosed with SBO. Cardiology asked to see for preop evaluation.   Assessment & Plan    1. AF with RVR:  Rate better today now ranging from 85 - 105 on amiodarone drip,  lopressor 75 mg q 8 hrs, dig 0.125 mg.   Pt has chronic AF and severe bi-atrial enlargement.  Will try to dc amiodarone today.  If rate significantly increases may need to start oral therapy and/or further titrate metoprolol.   2. Anticoagulation: was on eliquis PTA. lovenox dc'd;  Now tolerating resumption of eliquis. No bleeding  2. Chronic systolic/diastolic heart failure.  Echo re-confirms EF 30 - 35%.  Appears euvolemic on exam. He was on losartan PTA. Entresto 24/26 was started in place of resuming losartan; plan titration as outpatient in several weeks to 49/51 if renal fxn and BP allow.  3  HTN: BP improved today now 115/87  4. SBO:  Appears to be have resolved. Tolerating diet advancement.  5. Stage 3 CKD:  Cr increased to 1.63 today; continue entresto and f/u renal fxn. Continue oral intake for hydration.    Signed, Lennette Bihari, MD, [facility] 11/07/2016, 8:32 AM

## 2016-11-08 MED ORDER — AMIODARONE HCL 200 MG PO TABS: 200.0000 mg | ORAL_TABLET | Freq: Two times a day (BID) | ORAL | 0 refills | Status: DC

## 2016-11-08 MED ORDER — LISINOPRIL 10 MG PO TABS: 10.0000 mg | ORAL_TABLET | Freq: Every day | ORAL | 0 refills | Status: DC

## 2016-11-08 MED ORDER — DIGOXIN 125 MCG PO TABS: 0.1250 mg | ORAL_TABLET | Freq: Every day | ORAL | 0 refills | Status: DC

## 2016-11-08 MED ORDER — SACUBITRIL-VALSARTAN 24-26 MG PO TABS: 1.0000 | ORAL_TABLET | Freq: Two times a day (BID) | ORAL | 0 refills | Status: DC

## 2016-11-08 NOTE — Discharge Summary (Addendum)
Javier Berry ZOX:[id number] DOB: 15-Jan-1961 DOA: 10/31/2016  PCP: Erlinda Hong, MD  Admit date: 10/31/2016  Discharge date: 11/08/2016  Admitted From: Home   Disposition:  Home   Recommendations for Outpatient Follow-up:   Follow up with PCP in 1-2 weeks  PCP Please obtain BMP/CBC, 2 view CXR in 1week,  (see Discharge instructions)   PCP Please follow up on the following pending results: none   Home Health: None   Equipment/Devices: none  Consultations: Cards, CCS Discharge Condition: Stable   CODE STATUS: Full   Diet Recommendation:  Heart Healthy    Chief Complaint  Patient presents with  . Constipation  . Abdominal Pain  . Emesis     Brief history of present illness from the day of admission and additional interim summary    Javier F Austinis a 56 y.o.malewith medical history significant of Atrial fibrillation, HTN, PUD who presents with abdominal pain, nausea and vomiting for 3 days, he was diagnosed with small bowel obstruction admitted on 11/01/2016.                                                                 Hospital Course   1. SBO obstruction. No previous abdominal surgeries, never had surveillance colonoscopy. Was treated conservatively, reason still unclear surgery consulted and following. Now having bowel movements and SBO has clinically resolved, general surgery Saw the patient, he is tolerating diet, will follow with GI outpatient for colonoscopy and may also require EGD, still cause of SBO not clear.  2. Chronic A. fib currently in RVR. [place] vasc 2 score of 2 -  Eliquis resumed, Remained in RVR hence he was started on amiodarone drip, digoxin and beta blocker rate better, was seen by cardiology will be discharged on oral medications which will be amiodarone, digoxin and beta blocker.  Note amiodarone dose has to be dropped in 2 weeks by either PCP or cardiology.  3. 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 Currently compensated. IV fluids with caution and monitor. No rales or shortness of breath, remains stable, continue on Imdur, beta blocker and Entresto. Will monitor blood pressure.  Note after discharge pharmacy called, patient does not have coverage for Entresto, placed on lisinopril. Request PCP to monitor blood pressure and just and uptitrate ACE inhibitor as tolerated.  4. Hypertension. Lopressor and an Entresto PCP to monitor.  5. 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     Discharge diagnosis     Active Problems:   Essential hypertension   Atrial fibrillation (HCC)   CKD (chronic kidney disease) stage 3, GFR 30-59 ml/min   Chronic combined systolic and diastolic CHF (congestive heart failure) (HCC)   SBO (small bowel obstruction)    Discharge instructions    Discharge Instructions  Diet - low sodium heart healthy    Complete by:  As directed    Discharge instructions    Complete by:  As directed    Follow with Primary MD [facility], MD in 7 days   Get CBC, CMP, Digoxin level, 2 view Chest X ray checked  by Primary MD or SNF MD in 5-7 days ( we routinely change or add medications that can affect your baseline labs and fluid status, therefore we recommend that you get the mentioned basic workup next visit with your PCP, your PCP may decide not to get them or add new tests based on their clinical decision)  Activity: As tolerated with Full fall precautions use walker/cane & assistance as needed  Disposition Home   Diet:   Heart Healthy   - Check your Weight same time everyday, if you gain over 2 pounds, or you develop in leg swelling, experience more shortness of breath or chest pain, call your Primary MD immediately. Follow Cardiac Low Salt Diet and 1.5 lit/day fluid restriction.  On your next  visit with your primary care physician please Get Medicines reviewed and adjusted.  Please request your Prim.MD to go over all Hospital Tests and Procedure/Radiological results at the follow up, please get all Hospital records sent to your Prim MD by signing hospital release before you go home.  If you experience worsening of your admission symptoms, develop shortness of breath, life threatening emergency, suicidal or homicidal thoughts you must seek medical attention immediately by calling 911 or calling your MD immediately  if symptoms less severe.  You Must read complete instructions/literature along with all the possible adverse reactions/side effects for all the Medicines you take and that have been prescribed to you. Take any new Medicines after you have completely understood and accpet all the possible adverse reactions/side effects.   Do not drive, operate heavy machinery, perform activities at heights, swimming or participation in water activities or provide baby sitting services if your were admitted for syncope or siezures until you have seen by Primary MD or a Neurologist and advised to do so again.  Do not drive when taking Pain medications.    Do not take more than prescribed Pain, Sleep and Anxiety Medications  Special Instructions: If you have smoked or chewed Tobacco  in the last 2 yrs please stop smoking, stop any regular Alcohol  and or any Recreational drug use.  Wear Seat belts while driving.   Please note  You were cared for by a hospitalist during your hospital stay. If you have any questions about your discharge medications or the care you received while you were in the hospital after you are discharged, you can call the unit and asked to speak with the hospitalist on call if the hospitalist that took care of you is not available. Once you are discharged, your primary care physician will handle any further medical issues. Please note that NO REFILLS for any discharge  medications will be authorized once you are discharged, as it is imperative that you return to your primary care physician (or establish a relationship with a primary care physician if you do not have one) for your aftercare needs so that they can reassess your need for medications and monitor your lab values.   Increase activity slowly    Complete by:  As directed       Discharge Medications   Allergies as of 11/08/2016   No Known Allergies     Medication List  STOP taking these medications   HYDROcodone-acetaminophen 7.5-325 MG tablet Commonly known as:  NORCO   losartan 100 MG tablet Commonly known as:  COZAAR     TAKE these medications   albuterol 108 (90 Base) MCG/ACT inhaler Commonly known as:  PROVENTIL HFA;VENTOLIN HFA Inhale 1-2 puffs into the lungs every 6 (six) hours as needed for wheezing or shortness of breath.   allopurinol 100 MG tablet Commonly known as:  ZYLOPRIM Take 100 mg by mouth daily as needed (gout attacks).   amiodarone 200 MG tablet Commonly known as:  PACERONE Take 1 tablet (200 mg total) by mouth 2 (two) times daily.   digoxin 0.125 MG tablet Commonly known as:  LANOXIN Take 1 tablet (0.125 mg total) by mouth daily.   ELIQUIS 5 MG Tabs tablet Generic drug:  apixaban TAKE 1 TABLET BY MOUTH TWICE DAILY   furosemide 40 MG tablet Commonly known as:  LASIX Take 1 tablet (40 mg total) by mouth daily. Need appointment before anymore refills   isosorbide mononitrate 30 MG 24 hr tablet Commonly known as:  IMDUR TAKE 1 TABLET (30 MG TOTAL) BY MOUTH DAILY FOR HEART   lisinopril 10 MG tablet Commonly known as:  PRINIVIL Take 1 tablet (10 mg total) by mouth daily.   metoprolol succinate 100 MG 24 hr tablet Commonly known as:  TOPROL-XL Take 1 tablet (100 mg total) by mouth daily. Take with or immediately following a meal.   ranitidine 150 MG capsule Commonly known as:  ZANTAC Take once or twice a day as needed for your stomach What  changed:  how much to take  how to take this  when to take this  reasons to take this  additional instructions   spironolactone 25 MG tablet Commonly known as:  ALDACTONE Take 1 tablet (25 mg total) by mouth daily. <PLEASE MAKE APPOINTMENT FOR REFILLS>       Follow-up Information    [facility], MD. Schedule an appointment as soon as possible for a visit in 1 week(s).   Specialty:  General Practice Contact information: [address] [place] [address] [phone]        Donato Schultz, MD. Schedule an appointment as soon as possible for a visit in 1 week(s).   Specialty:  Cardiology Why:  Afib Contact information: [address] [place] [address] [phone]        Stan Head, MD. Schedule an appointment as soon as possible for a visit in 1 week(s).   Specialty:  Gastroenterology Why:  EGD-Colonoscopy Contact information: [address] [place] [address] [phone]           Major procedures and Radiology Reports - PLEASE review detailed and final reports thoroughly  -     TTE - Left ventricle: The cavity size was normal. Wall thickness wasincreased in a pattern of mild LVH. Systolic function wasmoderately to severely reduced. The estimated ejection fractionwas in the range of 30% to 35%. Diffuse hypokinesis. The study isnot technically sufficient to allow evaluation of LV diastolicfunction.  -  Mitral valve: There was mild regurgitation. - Left atrium: The atrium was severely dilated. - Right atrium: The atrium was severely dilated. - Tricuspid valve: There was moderate regurgitation. - Pulmonary arteries: Systolic pressure was mildly increased. PA peak pressure: 41 mm Hg (S). - Pericardium, extracardiac: A trivial pericardial effusion was identified posterior to the heart.   CT scan abdomen and pelvis -  showing small bowel obstruction with transition  point around the ileum.   Ct Abdomen Pelvis  W Contrast  Result Date: 11/01/2016 CLINICAL DATA:  Periumbilical pain with episodes of emesis for 2 days. EXAM: CT ABDOMEN AND PELVIS WITH CONTRAST TECHNIQUE: Multidetector CT imaging of the abdomen and pelvis was performed using the standard protocol following bolus administration of intravenous contrast. CONTRAST:  ISOVUE-300 IOPAMIDOL (ISOVUE-300) INJECTION 61% COMPARISON:  None. FINDINGS: Lower chest: No acute abnormality. Hepatobiliary: No focal liver abnormality is seen. No gallstones, gallbladder wall thickening, or biliary dilatation. Pancreas: Unremarkable. No pancreatic ductal dilatation or surrounding inflammatory changes. Spleen: Normal in size without focal abnormality. Adrenals/Urinary Tract: Adrenal glands are unremarkable. Kidneys are normal, without renal calculi, focal lesion, or hydronephrosis. Bladder is unremarkable. Stomach/Bowel: Dilatation of the stomach and small bowel with abrupt transition to decompressed ileum. At the transition point in the right lower quadrant, no mass or hernia is evident. Colon is decompressed. Appendix is normal. Vascular/Lymphatic: The abdominal aorta is normal in caliber with mild atherosclerotic calcification. Reproductive: Unremarkable Other: Small volume ascites, perihepatic and in the dependent pelvis. Musculoskeletal: No significant skeletal lesion. IMPRESSION: Dilatation of small bowel with caliber transition in the ileum, consistent with a small bowel obstruction. No mass or hernia is evident at the transition point. Small volume ascites. Electronically Signed   By: Ellery Plunk M.D.   On: 11/01/2016 00:00   Dg Chest Port 1 View  Result Date: 11/06/2016 CLINICAL DATA:  Shortness of breath EXAM: PORTABLE CHEST 1 VIEW COMPARISON:  02/03/2015 FINDINGS: Cardiac enlargement. No vascular congestion. No edema or consolidation. No blunting of costophrenic angles. No pneumothorax. Mediastinal contours appear intact. IMPRESSION: Cardiac enlargement.   No evidence of active pulmonary disease. Electronically Signed   By: Burman Nieves M.D.   On: 11/06/2016 06:38   Dg Abd Portable 1v  Result Date: 11/04/2016 CLINICAL DATA:  Followup small bowel obstruction. EXAM: PORTABLE ABDOMEN - 1 VIEW COMPARISON:  Yesterday FINDINGS: Nasogastric tube remains coiled in the proximal stomach. Persistent small bowel distension out of proportion to colonic gas. The degree of small bowel distension is borderline improved, with loops measuring up to 5 cm in the left abdomen. There is increased gas in the right and transverse colon. No concerning mass effect or calcification. IMPRESSION: Small bowel obstruction. Small bowel distention persists but there is favorable increase in colonic gas. Electronically Signed   By: Marnee Spring M.D.   On: 11/04/2016 09:12   Dg Abd Portable 1v  Result Date: 11/03/2016 CLINICAL DATA:  Small bowel obstruction.  Nausea. EXAM: PORTABLE ABDOMEN - 1 VIEW COMPARISON:  11/02/2016. FINDINGS: Marked distention of small bowel. Nasogastric tube coiled in the fundus of the stomach. Worsening bowel distention compared with yesterday's radiograph, with increased size and number of distended small bowel loops. No abnormal calcifications. IMPRESSION: Worsening small bowel obstruction. Electronically Signed   By: Elsie Stain M.D.   On: 11/03/2016 07:59   Dg Abd Portable 1v  Result Date: 11/02/2016 CLINICAL DATA:  Small bowel obstruction. EXAM: PORTABLE ABDOMEN - 1 VIEW COMPARISON:  Abdominal plain film dated 11/01/2016. FINDINGS: Persistent small bowel dilatation within the upper abdomen and central abdomen. Air and stool noted within the large bowel. No evidence of soft tissue mass or abnormal fluid collection. No evidence of free intraperitoneal air seen. Mild degenerative change within the lower lumbar spine. No acute or suspicious osseous finding. IMPRESSION: No significant interval change. Distended air-filled small bowel loops within the upper  abdomen and central abdomen, compatible with given history of small bowel  obstruction. Electronically Signed   By: Bary Richard M.D.   On: 11/02/2016 09:51   Dg Abd Portable 1v-small Bowel Obstruction Protocol-initial, 8 Hr Delay  Result Date: 11/01/2016 CLINICAL DATA:  Small-bowel obstruction, 8 hour delay EXAM: PORTABLE ABDOMEN - 1 VIEW COMPARISON:  11/01/2016 FINDINGS: Persistent small bowel dilatation is identified. The overall appearance is similar to that seen on the prior exam. No definitive free air is noted. Nasogastric catheter remains in the stomach. No significant contrast material is noted. Degenerative changes of the lumbar spine are noted. IMPRESSION: Nasogastric catheter within the stomach. Changes consistent with small bowel obstruction stable from the prior exam. Electronically Signed   By: Alcide Clever M.D.   On: 11/01/2016 20:54   Dg Abd Portable 1 View  Result Date: 11/01/2016 CLINICAL DATA:  Nasogastric tube placement. Follow-up small bowel obstruction. EXAM: PORTABLE ABDOMEN - 1 VIEW COMPARISON:  CT abdomen and pelvis October 31, 2016 FINDINGS: Nasogastric tube tip and side-port project in proximal stomach. Multiple loops of gas distended small bowel measuring to 4.6 cm. No intra-abdominal mass effect or pathologic calcifications. IMPRESSION: Nasogastric tube tip projects in proximal stomach. Electronically Signed   By: Awilda Metro M.D.   On: 11/01/2016 03:03    Micro Results    Recent Results (from the past 240 hour(s))  MRSA PCR Screening     Status: None   Collection Time: 11/02/16  5:15 AM  Result Value Ref Range Status   MRSA by PCR NEGATIVE NEGATIVE Final    Comment:        The GeneXpert MRSA Assay (FDA approved for NASAL specimens only), is one component of a comprehensive MRSA colonization surveillance program. It is not intended to diagnose MRSA infection nor to guide or monitor treatment for MRSA infections.     Today   Subjective    Javier Berry today has no headache,no chest abdominal pain,no new weakness tingling or numbness, feels much better wants to go home today.     Objective   Blood pressure 120/71, pulse 96, temperature 98.7 F (37.1 C), temperature source Oral, resp. rate 18, height 6' (1.829 m), weight 91.8 kg (202 lb 4.8 oz), SpO2 96 %.   Intake/Output Summary (Last 24 hours) at 11/08/16 1141 Last data filed at 11/08/16 0100  Gross per 24 hour  Intake            41.75 ml  Output              650 ml  Net          -608.25 ml    Exam Awake Alert, Oriented x 3, No new F.N deficits, Normal affect [place].AT,PERRAL Supple Neck,No JVD, No cervical lymphadenopathy appriciated.  Symmetrical Chest wall movement, Good air movement bilaterally, CTAB RRR,No Gallops,Rubs or new Murmurs, No Parasternal Heave +ve B.Sounds, Abd Soft, Non tender, No organomegaly appriciated, No rebound -guarding or rigidity. No Cyanosis, Clubbing or edema, No new Rash or bruise   Data Review   CBC w Diff:  Lab Results  Component Value Date   WBC 6.4 11/05/2016   HGB 16.2 11/05/2016   HCT 46.7 11/05/2016   PLT 164 11/05/2016   LYMPHOPCT 28 02/03/2015   MONOPCT 8 02/03/2015   EOSPCT 3 02/03/2015   BASOPCT 1 02/03/2015    CMP:  Lab Results  Component Value Date   NA 139 11/07/2016   K 4.2 11/07/2016   CL 106 11/07/2016   CO2 26 11/07/2016   BUN 10 11/07/2016  CREATININE 1.63 (H) 11/07/2016   CREATININE 1.60 (H) 03/22/2015   PROT 5.4 (L) 11/02/2016   ALBUMIN 3.1 (L) 11/02/2016   BILITOT 2.3 (H) 11/02/2016   ALKPHOS 31 (L) 11/02/2016   AST 24 11/02/2016   ALT 16 (L) 11/02/2016  .   Total Time in preparing paper work, data evaluation and todays exam - 35 minutes  Leroy Sea M.D on 11/08/2016 at 11:41 AM  [facility]   Office  [phone]

## 2016-11-08 NOTE — Progress Notes (Signed)
Patient transferred to [phone], report given to [facility], RN. Placed on telemetry, [id number], verified at bedside with NT. Patient A&O, remains afib 80s-90s, VSS upon transfer. All belongings packed and sent with patient. Patient declined RN offer to update wife, states he has already notified her of transfer.

## 2016-11-08 NOTE — Discharge Instructions (Signed)
Follow with Primary MD Erlinda Hong, MD in 7 days   Get CBC, CMP, Digoxin level, 2 view Chest X ray checked  by Primary MD or SNF MD in 5-7 days ( we routinely change or add medications that can affect your baseline labs and fluid status, therefore we recommend that you get the mentioned basic workup next visit with your PCP, your PCP may decide not to get them or add new tests based on their clinical decision)  Activity: As tolerated with Full fall precautions use walker/cane & assistance as needed  Disposition Home   Diet:   Heart Healthy   - Check your Weight same time everyday, if you gain over 2 pounds, or you develop in leg swelling, experience more shortness of breath or chest pain, call your Primary MD immediately. Follow Cardiac Low Salt Diet and 1.5 lit/day fluid restriction.  On your next visit with your primary care physician please Get Medicines reviewed and adjusted.  Please request your Prim.MD to go over all Hospital Tests and Procedure/Radiological results at the follow up, please get all Hospital records sent to your Prim MD by signing hospital release before you go home.  If you experience worsening of your admission symptoms, develop shortness of breath, life threatening emergency, suicidal or homicidal thoughts you must seek medical attention immediately by calling 911 or calling your MD immediately  if symptoms less severe.  You Must read complete instructions/literature along with all the possible adverse reactions/side effects for all the Medicines you take and that have been prescribed to you. Take any new Medicines after you have completely understood and accpet all the possible adverse reactions/side effects.   Do not drive, operate heavy machinery, perform activities at heights, swimming or participation in water activities or provide baby sitting services if your were admitted for syncope or siezures until you have seen by Primary MD or a Neurologist and advised to do  so again.  Do not drive when taking Pain medications.    Do not take more than prescribed Pain, Sleep and Anxiety Medications  Special Instructions: If you have smoked or chewed Tobacco  in the last 2 yrs please stop smoking, stop any regular Alcohol  and or any Recreational drug use.  Wear Seat belts while driving.   Please note  You were cared for by a hospitalist during your hospital stay. If you have any questions about your discharge medications or the care you received while you were in the hospital after you are discharged, you can call the unit and asked to speak with the hospitalist on call if the hospitalist that took care of you is not available. Once you are discharged, your primary care physician will handle any further medical issues. Please note that NO REFILLS for any discharge medications will be authorized once you are discharged, as it is imperative that you return to your primary care physician (or establish a relationship with a primary care physician if you do not have one) for your aftercare needs so that they can reassess your need for medications and monitor your lab values.

## 2016-11-08 NOTE — Progress Notes (Signed)
Spoke with Dr. Montez Morita about transferring patient to telemetry since ED is holding stepdown patients and patient is stable and seems appropriate for transfer. Patient is currently afib, rate controlled in 80s-90s since amio changed to PO at 1pm 11/07/16, pt also on PO metoprolol and digoxin. MD reviewed patients chart and has placed transfer to telemetry orders.

## 2016-11-08 NOTE — Progress Notes (Signed)
I was paged to review this patient's case for transfer to telemetry.  Primary issues appear to be SBO and atrial fibrillation with RVR.  The patient is tolerating a soft diet.  He has been off of the amiodarone infusion for almost 12 hours.  Per RN, he has had two doses of oral amiodarone.  HR is controlled 80's-90's.  He is on oral anticoagulation with Eliquis.  Transfer order placed; patient can move to a telemetry bed.

## 2016-11-10 ENCOUNTER — Other Ambulatory Visit: Payer: Self-pay | Admitting: Cardiology

## 2016-11-13 ENCOUNTER — Ambulatory Visit: Payer: BC Managed Care – PPO | Admitting: Nurse Practitioner

## 2016-11-13 ENCOUNTER — Encounter: Payer: Self-pay | Admitting: Physician Assistant

## 2016-11-14 ENCOUNTER — Encounter: Payer: Self-pay | Admitting: *Deleted

## 2016-11-18 ENCOUNTER — Ambulatory Visit (INDEPENDENT_AMBULATORY_CARE_PROVIDER_SITE_OTHER): Payer: BC Managed Care – PPO | Admitting: Nurse Practitioner

## 2016-11-18 ENCOUNTER — Encounter: Payer: Self-pay | Admitting: Nurse Practitioner

## 2016-11-18 VITALS — BP 160/80 | HR 66 | Ht 72.0 in | Wt 200.6 lb

## 2016-11-18 DIAGNOSIS — I5042 Chronic combined systolic (congestive) and diastolic (congestive) heart failure: Secondary | ICD-10-CM

## 2016-11-18 DIAGNOSIS — I110 Hypertensive heart disease with heart failure: Secondary | ICD-10-CM | POA: Diagnosis not present

## 2016-11-18 DIAGNOSIS — I428 Other cardiomyopathies: Secondary | ICD-10-CM | POA: Diagnosis not present

## 2016-11-18 DIAGNOSIS — Z9119 Patient's noncompliance with other medical treatment and regimen: Secondary | ICD-10-CM

## 2016-11-18 DIAGNOSIS — Z91199 Patient's noncompliance with other medical treatment and regimen due to unspecified reason: Secondary | ICD-10-CM

## 2016-11-18 DIAGNOSIS — I481 Persistent atrial fibrillation: Secondary | ICD-10-CM | POA: Diagnosis not present

## 2016-11-18 DIAGNOSIS — I4819 Other persistent atrial fibrillation: Secondary | ICD-10-CM

## 2016-11-18 LAB — BASIC METABOLIC PANEL
BUN: 16 mg/dL (ref 7–25)
CO2: 30 mmol/L (ref 20–31)
Calcium: 10.1 mg/dL (ref 8.6–10.3)
Chloride: 104 mmol/L (ref 98–110)
Creat: 1.68 mg/dL — ABNORMAL HIGH (ref 0.70–1.33)
Glucose, Bld: 76 mg/dL (ref 65–99)
POTASSIUM: 4.3 mmol/L (ref 3.5–5.3)
SODIUM: 144 mmol/L (ref 135–146)

## 2016-11-18 LAB — CBC
HCT: 49 % (ref 38.5–50.0)
HEMOGLOBIN: 16.6 g/dL (ref 13.2–17.1)
MCH: 32.1 pg (ref 27.0–33.0)
MCHC: 33.9 g/dL (ref 32.0–36.0)
MCV: 94.8 fL (ref 80.0–100.0)
MPV: 10.9 fL (ref 7.5–12.5)
PLATELETS: 206 10*3/uL (ref 140–400)
RBC: 5.17 MIL/uL (ref 4.20–5.80)
RDW: 14.5 % (ref 11.0–15.0)
WBC: 3.9 10*3/uL (ref 3.8–10.8)

## 2016-11-18 MED ORDER — DIGOXIN 125 MCG PO TABS: 0.1250 mg | ORAL_TABLET | Freq: Every day | ORAL | 3 refills | Status: DC

## 2016-11-18 MED ORDER — METOPROLOL SUCCINATE ER 100 MG PO TB24: ORAL_TABLET | ORAL | 3 refills | Status: DC

## 2016-11-18 MED ORDER — FUROSEMIDE 40 MG PO TABS: 40.0000 mg | ORAL_TABLET | Freq: Every day | ORAL | 3 refills | Status: DC

## 2016-11-18 MED ORDER — LISINOPRIL 20 MG PO TABS: 20.0000 mg | ORAL_TABLET | Freq: Every day | ORAL | 3 refills | Status: DC

## 2016-11-18 MED ORDER — SPIRONOLACTONE 25 MG PO TABS: 25.0000 mg | ORAL_TABLET | Freq: Every day | ORAL | 3 refills | Status: DC

## 2016-11-18 NOTE — Progress Notes (Signed)
Office Visit    Patient Name: Javier Berry Date of Encounter: 11/18/2016  Primary Care Provider:  Erlinda Hong, MD Primary Cardiologist:  B. Jens Som, MD   Chief Complaint    56 year old male with a prior history of nonischemic cardiomyopathy, hypertensive heart disease, chronic combined systolic and diastolic CHF, persistent atrial fibrillation on eliquis, and stage III chronic kidney disease, who presents for follow-up after recent hospitalization for small bowel obstruction and rapid A. fib.  Past Medical History    Past Medical History:  Diagnosis Date  . Chronic systolic CHF (congestive heart failure) (HCC)    a. 01/2015 Echo: EF 35-40%;  b. 10/2016 Echo: EF 30-35%, diff HK, mild MR, sev dil LA/RA, mod TR, PASP .  . CKD (chronic kidney disease), stage III   . Essential hypertension    a. with h/o HTN urgency in the setting of noncompliance.  . Gout   . NICM (nonischemic cardiomyopathy) (HCC)    a. 01/2015 Echo: EF 35-40%;  b. 01/2015 Myoview: EF 30-44%, no ischemia/infarct;  c. 10/2016 Echo: EF 30-35%, diff HK.  [name] Persistent atrial fibrillation (HCC)    a. CHA2DS2VASc = 2-->Eliquis;  b. s/p failed DCCV in 11/2013;  c. 10/2016 Echo: severe biatrial enlargement.  . PUD (peptic ulcer disease)    Past Surgical History:  Procedure Laterality Date  . CARDIOVERSION N/A 12/20/2013   Procedure: CARDIOVERSION;  Surgeon: Lewayne Bunting, MD;  Location: [facility] ENDOSCOPY;  Service: Cardiovascular;  Laterality: N/A;  . TOE SURGERY      Allergies  No Known Allergies  History of Present Illness    56 year old male with the above complex past medical history. He has a history of persistent nature fibrillation dating back to 2015, at which time he underwent unsuccessful cardioversion. Attempts of been made at rate control since. He also has a history of nonischemic cardiomyopathy with echo in May 2016 showing an EF of 35-40%. Stress testing was nonischemic at that time. He was  admitted at that time in the setting of hypertensive urgency and noncompliance. He was last seen in clinic here in June 2016. He says that several months ago, he ran out of his blood pressure medicines and felt pretty well off of them and therefore did not contact anyone for a refill. He was admitted to [facility] earlier this month with abdominal discomfort and was diagnosed with small bowel obstruction. He was seen by surgery but this was ultimately conservatively managed. In the setting of abdominal discomfort and SBO, his heart rates were elevated. He was placed on IV amiodarone for rate control and this was subsequently converted to oral amiodarone. He was discharged home on amiodarone 200 mg twice a day. Echo performed during admission showed an EF of 30-35%. He is maintained on beta blocker and also started on entresto but his insurance would not cover the entresto and this was switched to lisinopril.    Since discharge, he reports doing well. He has not been having any chest pain, dyspnea, palpitations, PND, orthopnea, dizziness, syncopal, edema, or early satiety. He reports compliance with all of his medications. He has not been weighing himself daily. He has not been monitoring salt intake.  Home Medications    Prior to Admission medications   Medication Sig Start Date End Date Taking? Authorizing Provider  albuterol (PROVENTIL HFA;VENTOLIN HFA) 108 (90 Base) MCG/ACT inhaler Inhale 1-2 puffs into the lungs every 6 (six) hours as needed for wheezing or shortness of breath.   Yes Historical Provider,  MD  allopurinol (ZYLOPRIM) 100 MG tablet Take 100 mg by mouth daily as needed (gout attacks).    Yes Historical Provider, MD  amiodarone (PACERONE) 200 MG tablet Take 1 tablet (200 mg total) by mouth 2 (two) times daily. 11/08/16  Yes Leroy Sea, MD  digoxin (LANOXIN) 0.125 MG tablet Take 1 tablet (0.125 mg total) by mouth daily. 11/18/16  Yes Ok Anis, NP  ELIQUIS 5 MG TABS tablet TAKE 1  TABLET BY MOUTH TWICE DAILY 03/21/16  Yes Marykay Lex, MD  furosemide (LASIX) 40 MG tablet Take 1 tablet (40 mg total) by mouth daily. 11/18/16  Yes Ok Anis, NP  HYDROcodone-acetaminophen (NORCO/VICODIN) 5-325 MG tablet Take 1 tablet by mouth every 6 (six) hours as needed for pain. 10/29/16  Yes Historical Provider, MD  isosorbide mononitrate (IMDUR) 30 MG 24 hr tablet TAKE 1 TABLET (30 MG TOTAL) BY MOUTH DAILY FOR HEART 05/05/16  Yes Lewayne Bunting, MD  lisinopril (PRINIVIL,ZESTRIL) 20 MG tablet Take 1 tablet (20 mg total) by mouth daily. 11/18/16  Yes Ok Anis, NP  metoprolol succinate (TOPROL-XL) 100 MG 24 hr tablet Take 0.5 tablet (50 mg total) by mouth once daily. Take with or immediately following a meal. 11/18/16  Yes Ok Anis, NP  ranitidine (ZANTAC) 150 MG capsule Take once or twice a day as needed for your stomach Patient taking differently: Take 150 mg by mouth daily as needed (stomach pain/ulcers). Take once or twice a day as needed for your stomach 10/17/13  Yes Gwenlyn Found Copland, MD  spironolactone (ALDACTONE) 25 MG tablet Take 1 tablet (25 mg total) by mouth daily. 11/18/16  Yes Ok Anis, NP    Review of Systems    As above, he has been doing well since admission. He denies any further abdominal discomfort, nausea, vomiting, diarrhea, chest pain, dyspnea, palpitations, dizziness, syncope, edema, or early satiety.  All other systems reviewed and are otherwise negative except as noted above.  Physical Exam    VS:  BP (!) 160/80   Pulse 66   Ht 6' (1.829 m)   Wt 200 lb 9.6 oz (91 kg)   BMI 27.21 kg/m  , BMI Body mass index is 27.21 kg/m. GEN: Well nourished, well developed, in no acute distress.  HEENT: normal.  Neck: Supple, no JVD, carotid bruits, or masses. Cardiac: RRR, no murmurs, rubs, or gallops. No clubbing, cyanosis, edema.  Radials/DP/PT 2+ and equal bilaterally.  Respiratory:  Respirations regular and unlabored, clear to  auscultation bilaterally. GI: Soft, nontender, nondistended, BS + x 4. MS: no deformity or atrophy. Skin: warm and dry, no rash. Neuro:  Strength and sensation are intact. Psych: Normal affect.  Accessory Clinical Findings    ECG - Atrial fibrillation, 66, left axis, anterolateral T changes. No acute ST or T changes.  Assessment & Plan    1.  Persistent atrial fibrillation: Patient was recently admitted with small bowel obstruction and was noted to have A. fib with RVR. He has a prior history of persistent atrial fibrillation with failed cardioversion in 2015. He has severe biatrial enlargement by echo earlier this month, and is thus a poor candidate for ablation. During recent hospitalization, rates were difficult to control and he was ultimately placed on amiodarone therapy for rate control only. He has been anticoagulated with eliquis long-term. He reports compliance with medications including beta blocker, digoxin, amiodarone, and eliquis. Heart rate is well controlled at 66 bpm today. He reports today that  he was noncompliant with his medications prior to his admission. Given relative youth, I think we should make an effort to get him off of amiodarone since we are not focused on rhythm management. He has recovered from his small bowel obstruction. I've asked him to reduce his amiodarone to 200 mg daily for the next 2 weeks to get him further out from that event and then he will discontinue. He will remain on beta blocker and digoxin therapy. I will follow-up a digoxin level today as this was just started during recent admission.   2. Nonischemic cardiomyopathy/chronic systolic and diastolic congestive heart failure: He is euvolemic on exam today. We discussed the importance of daily weights, sodium restriction, medication compliance, and symptom reporting and he verbalizes understanding.  He remains on beta blocker, ACE inhibitor, Lasix, and spironolactone.  He was not discharged home on  spironolactone, but had been on that previously. He just resumed it when he went home. I will follow-up a basic metabolic panel today in the setting of stage III chronic kidney disease. Also of note, he was initially discharged on entresto but apparently his insurance required a prior authorization. At this point, patient prefers to stay on lisinopril, and given history of noncompliance may be better served by once a day versus twice a day medicine.  3. Hypertensive heart disease: Blood pressure is elevated today. I'm titrating lisinopril to 20 mg daily. I've asked him to follow his blood pressure once daily at home.  4. Stage III chronic disease: He was discharged with a creatinine of 1.63, which was up from his admission creatinine. He has been taking lisinopril, Lasix, spinal activity, and digoxin. I am following up a basic metabolic panel as well as a digoxin level today.  5. Recent small bowel obstruction: This was conservatively managed. He has had no recurrence of symptoms.  6. Noncompliance:  Pt has a h/o stopping meds and did so prior to recent hospitalization.  We discussed the importance of medication compliance.    7.  Disposition: Labs today. I will plan to see him back in clinic in 3 weeks to reassess volume, blood pressure, compliance, and heart rates.  Nicolasa Ducking, NP 11/18/2016, 2:19 PM

## 2016-11-18 NOTE — Patient Instructions (Addendum)
Medication Instructions: Ward Givens, NP has recommended making the following medication changes: 1. DECREASE Amiodarone to 200 mg daily for 2 weeks, then STOP 2. INCREASE Lisinopril to 20 mg daily  Labwork: Your physician recommends that you return for lab work TODAY.  Testing/Procedures: NONE ORDERED  Follow-up: Your physician recommends that you schedule a follow-up appointment in 3 weeks with [name].  If you need a refill on your cardiac medications before your next appointment, please call your pharmacy.

## 2016-11-19 ENCOUNTER — Telehealth: Payer: Self-pay | Admitting: *Deleted

## 2016-11-19 LAB — DIGOXIN LEVEL: DIGOXIN LVL: 0.6 ug/L — AB (ref 0.8–2.0)

## 2016-11-19 NOTE — Telephone Encounter (Signed)
Home phone rings w no voice mail. Left msg on mobile number to call.

## 2016-11-19 NOTE — Telephone Encounter (Signed)
-----   Message from Ok Anis, NP sent at 11/19/2016 11:23 AM EST ----- Judieth Keens, renal fxn, h/h, digoxin level all stable.

## 2016-11-20 ENCOUNTER — Ambulatory Visit: Payer: BC Managed Care – PPO | Admitting: Physician Assistant

## 2016-11-20 ENCOUNTER — Other Ambulatory Visit: Payer: Self-pay

## 2016-11-20 NOTE — Telephone Encounter (Signed)
Follow up ° ° ° ° ° °Returning a call to the nurse to get lab results °

## 2016-11-20 NOTE — Telephone Encounter (Signed)
Spoke to patient. Result given . Verbalized understanding  patient conc ren blood pressure is elevated. When he goes to bed 15's/101  and when he awakes in the morning 120's/ 80.  patient states he has d.o.t. physical next week and concerned he has been taking a reading every 2 hours. Informed patient only take no more than 3 times a day.take furosemide in the morning and Alternate lisinopril in morning and metoprolol at night.  Appointment schedule for 11/24/16  Prior to d.o.t .visit. Patient aware.

## 2016-11-23 NOTE — Progress Notes (Signed)
Cardiology Office Note   Date:  11/24/2016   ID:  Javier Berry, DOB 12/22/1960, MRN [id number]  PCP:  Erlinda Hong, MD  Cardiologist:  Dr. Velta Addison     Chief Complaint  Patient presents with  . Hypertension      History of Present Illness: Javier Berry is a 56 y.o. male who presents for HTN, more elevated than it was.  He has history of nonischemic cardiomyopathy, hypertensive heart disease, chronic combined systolic and diastolic CHF, persistent atrial fibrillation on eliquis, and stage III chronic kidney disease.   Recent hospitalization for SBO and A fib RVR.   EF was lower than it had been as well.   He has a prior history of persistent atrial fibrillation with failed cardioversion in 2015. He has severe biatrial enlargement by echo earlier this month, and is thus a poor candidate for ablation. During recent hospitalization, rates were difficult to control and he was ultimately placed on amiodarone therapy for rate control only. He has been anticoagulated with eliquis long-term. He reports compliance with medications including beta blocker, digoxin, amiodarone, and eliquis. Heart rate is well controlled at 66 bpm today. He reported on last visit  that he was noncompliant with his medications prior to that admission.  On last visit Javier Berry began weaning amiodarone and pt will stop in a week.  Today HR is 54.  On last visit lisinopril increased for elevated BP.  Labs were done.  Amiodarone was decreased to 200 mg daily for 2 weeks then stop and lisinopril to 20 mg.   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    He is now weighing daily and wt is stable.  No SOB or chest pain.  Past Medical History:  Diagnosis Date  . Chronic systolic CHF (congestive heart failure) (HCC)    a. 01/2015 Echo: EF 35-40%;  b. 10/2016 Echo: EF 30-35%, diff HK, mild MR, sev dil LA/RA, mod TR, PASP  .  . CKD (chronic kidney disease), stage III   . Essential hypertension    a. with h/o HTN urgency in the setting of noncompliance.  . Gout   . NICM (nonischemic cardiomyopathy) (HCC)    a. 01/2015 Echo: EF 35-40%;  b. 01/2015 Myoview: EF 30-44%, no ischemia/infarct;  c. 10/2016 Echo: EF 30-35%, diff HK.  [name] Persistent atrial fibrillation (HCC)    a. CHA2DS2VASc = 2-->Eliquis;  b. s/p failed DCCV in 11/2013;  c. 10/2016 Echo: severe biatrial enlargement.  . PUD (peptic ulcer disease)     Past Surgical History:  Procedure Laterality Date  . CARDIOVERSION N/A 12/20/2013   Procedure: CARDIOVERSION;  Surgeon: Lewayne Bunting, MD;  Location: [facility] ENDOSCOPY;  Service: Cardiovascular;  Laterality: N/A;  . TOE SURGERY       Current Outpatient Prescriptions  Medication Sig Dispense Refill  . albuterol (PROVENTIL HFA;VENTOLIN HFA) 108 (90 Base) MCG/ACT inhaler Inhale 1-2 puffs into the lungs every 6 (six) hours as needed for wheezing or shortness of breath.    . allopurinol (ZYLOPRIM) 100 MG tablet Take 100 mg by mouth daily as needed (gout attacks).     [name] amiodarone (PACERONE) 200 MG tablet Take 1 tablet (200 mg total) by mouth 2 (two) times daily. (Patient taking differently: Take 200 mg by mouth daily. ) 60 tablet 0  . digoxin (LANOXIN) 0.125 MG tablet Take 1 tablet (0.125  mg total) by mouth daily. 90 tablet 3  . ELIQUIS 5 MG TABS tablet TAKE 1 TABLET BY MOUTH TWICE DAILY 60 tablet 6  . furosemide (LASIX) 40 MG tablet Take 1 tablet (40 mg total) by mouth daily. 90 tablet 3  . HYDROcodone-acetaminophen (NORCO/VICODIN) 5-325 MG tablet Take 1 tablet by mouth every 6 (six) hours as needed for pain.    . isosorbide mononitrate (IMDUR) 30 MG 24 hr tablet TAKE 1 TABLET (30 MG TOTAL) BY MOUTH DAILY FOR HEART 90 tablet 3  . lisinopril (PRINIVIL,ZESTRIL) 20 MG tablet Take 1 tablet (20 mg total) by mouth daily. 90 tablet 3  . metoprolol succinate (TOPROL-XL) 100 MG 24 hr tablet Take 0.5 tablet (50 mg  total) by mouth once daily. Take with or immediately following a meal. 45 tablet 3  . ranitidine (ZANTAC) 150 MG capsule Take once or twice a day as needed for your stomach (Patient taking differently: Take 150 mg by mouth daily as needed (stomach pain/ulcers). Take once or twice a day as needed for your stomach) 60 capsule 3  . amLODipine (NORVASC) 10 MG tablet Take 1 tablet (10 mg total) by mouth daily. 90 tablet 3  . spironolactone (ALDACTONE) 25 MG tablet Take 1 tablet (25 mg total) by mouth daily. 90 tablet 3   No current facility-administered medications for this visit.     Allergies:   Patient has no known allergies.    Social History:  The patient  reports that he has never smoked. He has never used smokeless tobacco. He reports that he does not drink alcohol or use drugs.   Family History:  The patient's family history includes Congestive Heart Failure in his father; Diabetes in his mother; Drug abuse in his brother; Heart disease in his brother; Heart murmur in his brother; Hypertension in his father; Kidney disease in his mother and sister; Stroke in his sister.    ROS:  General:no colds or fevers, no weight changes Skin:no rashes or ulcers HEENT:no blurred vision, no congestion CV:see HPI PUL:see HPI GI:no diarrhea constipation or melena, no indigestion GU:no hematuria, no dysuria MS:no joint pain, no claudication Neuro:no syncope, no lightheadedness Endo:no diabetes, no thyroid disease  Wt Readings from Last 3 Encounters:  11/24/16 198 lb (89.8 kg)  11/18/16 200 lb 9.6 oz (91 kg)  11/08/16 202 lb 4.8 oz (91.8 kg)     PHYSICAL EXAM: VS:  BP (!) 167/104   Pulse (!) 54   Ht 6' (1.829 m)   Wt 198 lb (89.8 kg)   BMI 26.85 kg/m  , BMI Body mass index is 26.85 kg/m. General:Pleasant affect, NAD Skin:Warm and dry, brisk capillary refill HEENT:normocephalic, sclera clear, mucus membranes moist Neck:supple, no JVD, no bruits  Heart:S1S2 RRR without murmur, gallup, rub  or click Lungs:clear without rales, rhonchi, or wheezes NWG:NFAO, non tender, + BS, do not palpate liver spleen or masses Ext:no lower ext edema, 2+ pedal pulses, 2+ radial pulses Neuro:alert and oriented, MAE, follows commands, + facial symmetry    EKG:  EKG is NOT  ordered today.    Recent Labs: 11/02/2016: ALT 16 11/06/2016: Magnesium 1.8 11/18/2016: BUN 16; Creat 1.68; Hemoglobin 16.6; Platelets 206; Potassium 4.3; Sodium 144    Lipid Panel No results found for: CHOL, TRIG, HDL, CHOLHDL, VLDL, LDLCALC, LDLDIRECT     Other studies Reviewed: Additional studies/ records that were reviewed today include: ECHO .Study Conclusions  - Left ventricle: The cavity size was normal. Wall thickness was   increased in  a pattern of mild LVH. Systolic function was   moderately to severely reduced. The estimated ejection fraction   was in the range of 30% to 35%. Diffuse hypokinesis. The study is   not technically sufficient to allow evaluation of LV diastolic   function. - Mitral valve: There was mild regurgitation. - Left atrium: The atrium was severely dilated. - Right atrium: The atrium was severely dilated. - Tricuspid valve: There was moderate regurgitation. - Pulmonary arteries: Systolic pressure was mildly increased. PA   peak pressure: 41 mm Hg (S). - Pericardium, extracardiac: A trivial pericardial effusion was   identified posterior to the heart.    ASSESSMENT AND PLAN:  1.  HTN uncontrolled today.  HR is 54 so no increase in BB, will add amlodipine 10 mg which he was on previously.  We will see him back in 1 week to see if controlled.  No work until 12/11/16 he needs to be able to pass physical.  I discussed by concern for work and his EF.  Will recheck labs today.  He will keep follow up with Javier Berry on the 15th.  2.  Permanent a fib, a little slow but will soon stop amiodarone and his HR should improve.  He is asymptomatic. On Eliquis and he is taking    3.  Chronic  systolic and diastolic HF. [name]    Lower EF.  euvolemic today.  4.  NICM see above pt is weighing daily now.  Stable wt.   5.  CKD-3 recheck labs with elevated CR  6. Recent SBO nor problems currently.    Current medicines are reviewed with the patient today.  The patient Has no concerns regarding medicines.  The following changes have been made:  See above Labs/ tests ordered today include:see above  Disposition:   FU:  see above  Signed, Nada Boozer, NP  11/24/2016 9:54 AM    [facility] [address] [place], [place], [place]  [address]/ [address] [place], [place] Phone: [phone]; Fax: [phone]  [phone]

## 2016-11-24 ENCOUNTER — Ambulatory Visit (INDEPENDENT_AMBULATORY_CARE_PROVIDER_SITE_OTHER): Payer: BC Managed Care – PPO | Admitting: Cardiology

## 2016-11-24 ENCOUNTER — Encounter: Payer: Self-pay | Admitting: *Deleted

## 2016-11-24 ENCOUNTER — Encounter: Payer: Self-pay | Admitting: Cardiology

## 2016-11-24 VITALS — BP 167/104 | HR 54 | Ht 72.0 in | Wt 198.0 lb

## 2016-11-24 DIAGNOSIS — I10 Essential (primary) hypertension: Secondary | ICD-10-CM

## 2016-11-24 DIAGNOSIS — N183 Chronic kidney disease, stage 3 unspecified: Secondary | ICD-10-CM

## 2016-11-24 DIAGNOSIS — Z7901 Long term (current) use of anticoagulants: Secondary | ICD-10-CM

## 2016-11-24 DIAGNOSIS — I428 Other cardiomyopathies: Secondary | ICD-10-CM | POA: Diagnosis not present

## 2016-11-24 DIAGNOSIS — I4819 Other persistent atrial fibrillation: Secondary | ICD-10-CM

## 2016-11-24 DIAGNOSIS — I481 Persistent atrial fibrillation: Secondary | ICD-10-CM

## 2016-11-24 LAB — BASIC METABOLIC PANEL
BUN: 22 mg/dL (ref 7–25)
CHLORIDE: 104 mmol/L (ref 98–110)
CO2: 32 mmol/L — AB (ref 20–31)
Calcium: 10.2 mg/dL (ref 8.6–10.3)
Creat: 1.81 mg/dL — ABNORMAL HIGH (ref 0.70–1.33)
Glucose, Bld: 96 mg/dL (ref 65–99)
Potassium: 4.8 mmol/L (ref 3.5–5.3)
SODIUM: 144 mmol/L (ref 135–146)

## 2016-11-24 MED ORDER — AMLODIPINE BESYLATE 10 MG PO TABS: 10.0000 mg | ORAL_TABLET | Freq: Every day | ORAL | 3 refills | Status: DC

## 2016-11-24 NOTE — Patient Instructions (Signed)
Medication Instructions:  1. START AMLODIPINE 10 MG DAILY; RX HAS BEEN SENT IN  2. YOUR LAST DOSE OF AMIODARONE WILL BE ON 12/02/16; AFTER 12/02/16 YOU WILL NOT BE ON AMIODARONE    Labwork: 1. BMET TODAY  Testing/Procedures: NONE  Follow-Up: IN 1 WEEK YOU WILL NEED A FOLLOW UP TO RE-CHECK BLOOD PRESSURE; PER LAURA INGOLD, PAC SCHEDULE AND APPT WITH APP, CHRIS BERG IF POSSIBLE.   MAKE SURE TO STILL KEEP YOUR APPT WITH CHRIS BERG, NP ON 12/11/16  Any Other Special Instructions Will Be Listed Below (If Applicable).     If you need a refill on your cardiac medications before your next appointment, please call your pharmacy.

## 2016-11-25 ENCOUNTER — Telehealth: Payer: Self-pay | Admitting: *Deleted

## 2016-11-25 ENCOUNTER — Encounter: Payer: Self-pay | Admitting: *Deleted

## 2016-11-25 DIAGNOSIS — R748 Abnormal levels of other serum enzymes: Secondary | ICD-10-CM

## 2016-11-25 DIAGNOSIS — R7989 Other specified abnormal findings of blood chemistry: Secondary | ICD-10-CM

## 2016-11-25 NOTE — Telephone Encounter (Signed)
Pt aware of his lab results.  He will have labs redrawn 12/02/16.

## 2016-11-25 NOTE — Telephone Encounter (Signed)
-----   Message from Leone Brand, NP sent at 11/25/2016  3:09 PM EST ----- Recheck BMP next week when he comes in for BP check.  Thanks.

## 2016-12-01 NOTE — Progress Notes (Deleted)
Patient ID: Javier Berry                 DOB: 06/12/1961                      MRN: [id number]     HPI: Javier Berry is a 55 y.o. male patient of DR Crenshaw referred by Laura Ingold NP  to HTN clinic. PMH include nonischemic cardiomyopathy, hypertension, chronic combined systolic and diastolic CHF, persistent atrial fibrillation, and stage III chronic kidney disease.  Amlodipine 10mg  was resumed during last OV to treat uncontrolled BP .   BMET 12/02/16??   Current HTN meds:  Amlodipine 10mg  daily Furosemide 40mg  daily Isosorbide monotitrate 30mg  daily lisinopril 20mg  daily Metoprolol succinate 50mg  daily spirinolactone 25mg  daily  Previously tried:  Hydralazine 50mg  TID HCTZ 25mg  daily Losartan 100mg  daily  BP goal: <130/80  Family History: The patient's family history includes Congestive Heart Failure in his father; Diabetes in his mother; Drug abuse in his brother; Heart disease in his brother; Heart murmur in his brother; Hypertension in his father; Kidney disease in his mother and sister; Stroke in his sister.  Social History: The patient  reports that he has never smoked. He has never used smokeless tobacco. He reports that he does not drink alcohol or use drugs.  Diet:   Exercise:   Home BP readings:   Wt Readings from Last 3 Encounters:  11/24/16 198 lb (89.8 kg)  11/18/16 200 lb 9.6 oz (91 kg)  11/08/16 202 lb 4.8 oz (91.8 kg)   BP Readings from Last 3 Encounters:  11/24/16 (!) 167/104  11/18/16 (!) 160/80  11/08/16 120/71   Pulse ReMarland Marland KitcheUlyses37 SoMariah [id number] [email or web address] y in the setting of noncompliance.  . Gout   . NICM (nonischemic cardiomyopathy) (HCC)    a. 01/2015 Echo: EF 35-40%;  b. 01/2015 Myoview: EF 30-44%, no ischemia/infarct;  c. 10/2016 Echo: EF 30-35%, diff HK.  . Persistent atrial fibrillation (HCC)    a. CHA2DS2VASc = 2-->Eliquis;  b. s/p failed DCCV in 11/2013;  c. 10/2016 Echo: severe biatrial enlargement.  . PUD (peptic ulcer disease)     Current Outpatient Prescriptions on File Prior to Visit  Medication Sig Dispense Refill  . albuterol (PROVENTIL HFA;VENTOLIN HFA) 108 (90 Base) MCG/ACT inhaler Inhale 1-2 puffs into the lungs every 6 (six) hours as needed for wheezing or shortness of breath.    . allopurinol (ZYLOPRIM) 100 MG tablet Take 100 mg by mouth daily as needed (gout attacks).     . amiodarone (PACERONE) 200 MG tablet Take 1 tablet (200 mg total) by mouth 2 (two) times daily. (Patient taking differently: Take 200 mg by mouth daily. ) 60 tablet 0  . amLODipine (NORVASC) 10 MG tablet Take 1 tablet (10 mg total) by mouth daily. 90 tablet 3  . digoxin (LANOXIN) 0.125 MG tablet Take 1 tablet (0.125 mg total) by mouth daily. 90 tablet 3  .  ELIQUIS 5 MG TABS tablet TAKE 1 TABLET BY MOUTH TWICE DAILY 60 tablet 6  . furosemide (LASIX) 40 MG tablet Take 1 tablet (40 mg total) by mouth daily. 90 tablet 3  . HYDROcodone-acetaminophen (NORCO/VICODIN) 5-325 MG tablet Take 1 tablet by mouth every 6 (six) hours as needed for pain.    . isosorbide mononitrate (IMDUR) 30 MG 24 hr tablet TAKE 1 TABLET (30 MG TOTAL) BY MOUTH DAILY FOR HEART 90 tablet 3  . lisinopril (PRINIVIL,ZESTRIL) 20 MG tablet Take 1 tablet (20 mg total) by mouth daily. 90 tablet 3  . metoprolol succinate (TOPROL-XL) 100 MG 24 hr tablet Take 0.5 tablet (50 mg total) by mouth once daily. Take with or immediately following a meal. 45 tablet 3  . ranitidine (ZANTAC) 150 MG capsule Take once or twice a day as needed for your  stomach (Patient taking differently: Take 150 mg by mouth daily as needed (stomach pain/ulcers). Take once or twice a day as needed for your stomach) 60 capsule 3  . spironolactone (ALDACTONE) 25 MG tablet Take 1 tablet (25 mg total) by mouth daily. 90 tablet 3   No current facility-administered medications on file prior to visit.     No Known Allergies  There were no vitals taken for this visit.  Essential hypertension:    Javier Berry PharmD, BCPS [facility] [address] [place] [address] 12/01/2016 9:14 PM

## 2016-12-02 ENCOUNTER — Other Ambulatory Visit: Payer: BC Managed Care – PPO

## 2016-12-02 ENCOUNTER — Ambulatory Visit: Payer: BC Managed Care – PPO

## 2016-12-03 ENCOUNTER — Telehealth: Payer: Self-pay | Admitting: Cardiology

## 2016-12-03 NOTE — Telephone Encounter (Signed)
Patient brought FMLA Forms to office to be processed.  Received FMLA Forms/Signed AUTH & Money Order for [employer] to process.  Sent to [employer] via Courier on 12/04/16. lp

## 2016-12-05 ENCOUNTER — Telehealth: Payer: Self-pay | Admitting: Cardiology

## 2016-12-05 NOTE — Telephone Encounter (Signed)
12/05/2016 Received FMLA Forms from back from [employer] for Dr. Jens Som to Sign.  Forms given to Gavin Pound, Dr. Jens Som.  cbr

## 2016-12-11 ENCOUNTER — Encounter: Payer: Self-pay | Admitting: Nurse Practitioner

## 2016-12-11 ENCOUNTER — Ambulatory Visit (INDEPENDENT_AMBULATORY_CARE_PROVIDER_SITE_OTHER): Payer: BC Managed Care – PPO | Admitting: Nurse Practitioner

## 2016-12-11 VITALS — BP 140/89 | HR 66 | Ht 72.0 in | Wt 203.0 lb

## 2016-12-11 DIAGNOSIS — I482 Chronic atrial fibrillation, unspecified: Secondary | ICD-10-CM

## 2016-12-11 DIAGNOSIS — I5022 Chronic systolic (congestive) heart failure: Secondary | ICD-10-CM | POA: Diagnosis not present

## 2016-12-11 DIAGNOSIS — N183 Chronic kidney disease, stage 3 unspecified: Secondary | ICD-10-CM

## 2016-12-11 DIAGNOSIS — I110 Hypertensive heart disease with heart failure: Secondary | ICD-10-CM

## 2016-12-11 DIAGNOSIS — I428 Other cardiomyopathies: Secondary | ICD-10-CM

## 2016-12-11 LAB — BASIC METABOLIC PANEL
BUN: 19 mg/dL (ref 7–25)
CALCIUM: 9.9 mg/dL (ref 8.6–10.3)
CHLORIDE: 106 mmol/L (ref 98–110)
CO2: 30 mmol/L (ref 20–31)
Creat: 1.54 mg/dL — ABNORMAL HIGH (ref 0.70–1.33)
Glucose, Bld: 86 mg/dL (ref 65–99)
POTASSIUM: 4.7 mmol/L (ref 3.5–5.3)
SODIUM: 144 mmol/L (ref 135–146)

## 2016-12-11 MED ORDER — LISINOPRIL 40 MG PO TABS: 40.0000 mg | ORAL_TABLET | Freq: Every day | ORAL | 5 refills | Status: DC

## 2016-12-11 NOTE — Patient Instructions (Signed)
Medication Instructions:   Increase lisinopril to 40mg  daily  Labwork:   BMET today at [facility]  Testing/Procedures: none   Follow-Up: with Ward Givens or Dr. Jens Som in 1 month.   If you need a refill on your cardiac medications before your next appointment, please call your pharmacy.

## 2016-12-11 NOTE — Progress Notes (Signed)
Office Visit    Patient Name: Javier Berry Date of Encounter: 12/11/2016  Primary Care Provider:  Thayer Headings, MD Primary Cardiologist:  B. Jens Som, MD   Chief Complaint    56 year old male with a history of nonischemic cardiomyopathy, hypertensive heart disease, chronic combined systolic and diastolic CHF, persistent atrial fibrillation on eliquis, and stage III chronic kidney disease who presents for follow-up.  Past Medical History    Past Medical History:  Diagnosis Date  . Chronic systolic CHF (congestive heart failure) (HCC)    a. 01/2015 Echo: EF 35-40%;  b. 10/2016 Echo: EF 30-35%, diff HK, mild MR, sev dil LA/RA, mod TR, PASP .  . CKD (chronic kidney disease), stage III   . Essential hypertension    a. with h/o HTN urgency in the setting of noncompliance.  . Gout   . NICM (nonischemic cardiomyopathy) (HCC)    a. 01/2015 Echo: EF 35-40%;  b. 01/2015 Myoview: EF 30-44%, no ischemia/infarct;  c. 10/2016 Echo: EF 30-35%, diff HK.  [name] Persistent atrial fibrillation (HCC)    a. CHA2DS2VASc = 2-->Eliquis;  b. s/p failed DCCV in 11/2013;  c. 10/2016 Echo: severe biatrial enlargement.  . PUD (peptic ulcer disease)    Past Surgical History:  Procedure Laterality Date  . CARDIOVERSION N/A 12/20/2013   Procedure: CARDIOVERSION;  Surgeon: Lewayne Bunting, MD;  Location: [facility] ENDOSCOPY;  Service: Cardiovascular;  Laterality: N/A;  . TOE SURGERY      Allergies  No Known Allergies  History of Present Illness    56 year old male with a history of chronic atrial fibrillation dating back to 2015 and nonischemic cardiomyopathy with an EF of 35-40% by echocardiogram in May 2016. Stress testing with nonischemic at that time. I recently saw him in clinic on February 20 following hospitalization for small bowel obstruction and rapid atrial fibrillation in the setting of medication noncompliance. When I saw him in clinic, he was doing well was well rate controlled. Of note, he was  placed on amiodarone during his hospitalization and I asked him to discontinue amiodarone once he ran out of it, since it was only being used for rate control and given his relative youth.  Since his last visit, he has done well. He is now off of amiodarone and heart rates are in the 60s. He reports compliance with all his medications. He says he has been weighing himself at home and it typically runs about 200. He is now back at work and feeling well. He denies chest pain, palpitations, dyspnea, PND, orthopnea, dizziness, syncope, edema, or early satiety.  Home Medications    Prior to Admission medications   Medication Sig Start Date End Date Taking? Authorizing Provider  albuterol (PROVENTIL HFA;VENTOLIN HFA) 108 (90 Base) MCG/ACT inhaler Inhale 1-2 puffs into the lungs every 6 (six) hours as needed for wheezing or shortness of breath.   Yes Historical Provider, MD  allopurinol (ZYLOPRIM) 100 MG tablet Take 100 mg by mouth daily as needed (gout attacks).    Yes Historical Provider, MD  amLODipine (NORVASC) 10 MG tablet Take 1 tablet (10 mg total) by mouth daily. 11/24/16 02/22/17 Yes Leone Brand, NP  digoxin (LANOXIN) 0.125 MG tablet Take 1 tablet (0.125 mg total) by mouth daily. 11/18/16  Yes Ok Anis, NP  ELIQUIS 5 MG TABS tablet TAKE 1 TABLET BY MOUTH TWICE DAILY 03/21/16  Yes Marykay Lex, MD  furosemide (LASIX) 40 MG tablet Take 1 tablet (40 mg total) by mouth daily. 11/18/16  Yes Ok Anis, NP  HYDROcodone-acetaminophen (NORCO/VICODIN) 5-325 MG tablet Take 1 tablet by mouth every 6 (six) hours as needed for pain. 10/29/16  Yes Historical Provider, MD  isosorbide mononitrate (IMDUR) 30 MG 24 hr tablet TAKE 1 TABLET (30 MG TOTAL) BY MOUTH DAILY FOR HEART 05/05/16  Yes Lewayne Bunting, MD  lisinopril (PRINIVIL,ZESTRIL) 40 MG tablet Take 1 tablet (40 mg total) by mouth daily. 12/11/16  Yes Ok Anis, NP  metoprolol succinate (TOPROL-XL) 100 MG 24 hr tablet Take 0.5  tablet (50 mg total) by mouth once daily. Take with or immediately following a meal. 11/18/16  Yes Ok Anis, NP  ranitidine (ZANTAC) 150 MG capsule Take once or twice a day as needed for your stomach Patient taking differently: Take 150 mg by mouth daily as needed (stomach pain/ulcers). Take once or twice a day as needed for your stomach 10/17/13  Yes Gwenlyn Found Copland, MD  spironolactone (ALDACTONE) 25 MG tablet Take 1 tablet (25 mg total) by mouth daily. 11/18/16  Yes Ok Anis, NP  hydrALAZINE (APRESOLINE) 25 MG tablet Take 1 tablet by mouth daily. 11/22/16   Historical Provider, MD    Review of Systems    As above, he has been doing well since his last visit.  He denies chest pain, palpitations, dyspnea, pnd, orthopnea, n, v, dizziness, syncope, edema, weight gain, or early satiety.  All other systems reviewed and are otherwise negative except as noted above.  Physical Exam    VS:  BP 140/89   Pulse 66   Ht 6' (1.829 m)   Wt 203 lb (92.1 kg)   BMI 27.53 kg/m  , BMI Body mass index is 27.53 kg/m. GEN: Well nourished, well developed, in no acute distress.  HEENT: normal.  Neck: Supple, no JVD, carotid bruits, or masses. Cardiac: RRR, no murmurs, rubs, or gallops. No clubbing, cyanosis, edema.  Radials/DP/PT 2+ and equal bilaterally.  Respiratory:  Respirations regular and unlabored, clear to auscultation bilaterally. GI: Soft, nontender, nondistended, BS + x 4. MS: no deformity or atrophy. Skin: warm and dry, no rash. Neuro:  Strength and sensation are intact. Psych: Normal affect.  Accessory Clinical Findings    Lab Results  Component Value Date   CREATININE 1.81 (H) 11/24/2016   BUN 22 11/24/2016   NA 144 11/24/2016   K 4.8 11/24/2016   CL 104 11/24/2016   CO2 32 (H) 11/24/2016    Assessment & Plan    1.  Chronic atrial fibrillation: This is well rate controlled on beta blocker therapy. He had previous been taking amiodarone after hospice physician  for small bowel obstruction and rapid rates. I discontinued this given permanent nature of his A. fib. He has been compliant with eliquis.  2. Nonischemic cardiomyopathy/chronic HFrEF: Euvolemic on exam. He remains on beta blocker, ACE inhibitor, Lasix, and spironolactone. Of note, he had previously been prescribed entresto but this was switched to ACE inhibitor because his insurance apparently would not cover it. With elevated blood pressure, I'm going to increase his lisinopril to 40 mg daily today. I will also check a basic metabolic panel today. We will need to plan on repeat echocardiography to reevaluate LV function and determine candidacy for ICD therapy, provided he remains compliant with medications.  3. Hypertensive heart disease: Elease Hashimoto elevated today. I am increasing his lisinopril to 40 mg daily.  4. Stage III chronic kidney disease: Creatinine was mildly elevated above baseline at last check. He was supposed to have  a follow-up one week later but did not show for this. We'll repeat this today. Remains on lisinopril.  5. Noncompliance: Patient reports ongoing compliance with medications.  6. Disposition: Follow-up basic metabolic panel today. Provided this is stable, I will plan to see him back in one month.  Nicolasa Ducking, NP 12/11/2016, 1:31 PM

## 2016-12-19 ENCOUNTER — Telehealth: Payer: Self-pay | Admitting: *Deleted

## 2016-12-19 NOTE — Telephone Encounter (Signed)
-----   Message from Ok Anis, NP sent at 12/12/2016  8:08 AM EDT ----- Renal fxn and lytes stable.

## 2016-12-19 NOTE — Telephone Encounter (Signed)
Left msg to call.

## 2016-12-22 NOTE — Telephone Encounter (Signed)
Follow Up ° ° °Pt returning phone call from nurse °

## 2016-12-22 NOTE — Telephone Encounter (Signed)
Spoke to patient. Seen on 3/15. He went back to work on 12/01/16. Needs a letter for his employer advising that it's OK that he went back to work at full duty/no restrictions. Patient aware I'll send request to [facility] to review. Also reviewed his labwork with him.

## 2016-12-22 NOTE — Telephone Encounter (Signed)
Patient came in for work note - request to return to normal work w/ no restrictions. Recently seen in office by [name].  he dropped off a yellow sheet and request is worded, "note stating I can return to normal work on 12/01/16 w no restrictions - I need asap to get paid".  Not sure if he meant 01/01/17  No mention of work restriction in office note.  I gave patient a call - left msg requesting more information as to what he needs.

## 2016-12-23 ENCOUNTER — Encounter: Payer: Self-pay | Admitting: *Deleted

## 2016-12-23 NOTE — Telephone Encounter (Signed)
Spoke w patient, advised letter ready for pickup - he voiced thanks & will come to get this today.

## 2016-12-23 NOTE — Telephone Encounter (Signed)
Ok to return to work w/o restrictions.

## 2017-01-08 ENCOUNTER — Encounter: Payer: Self-pay | Admitting: *Deleted

## 2017-01-08 ENCOUNTER — Ambulatory Visit: Payer: BC Managed Care – PPO | Admitting: Nurse Practitioner

## 2017-02-10 ENCOUNTER — Other Ambulatory Visit: Payer: Self-pay | Admitting: Cardiology

## 2017-03-28 ENCOUNTER — Other Ambulatory Visit: Payer: Self-pay | Admitting: Cardiology

## 2017-04-28 ENCOUNTER — Other Ambulatory Visit: Payer: Self-pay | Admitting: *Deleted

## 2017-04-28 MED ORDER — METOPROLOL SUCCINATE ER 100 MG PO TB24: 100.0000 mg | ORAL_TABLET | Freq: Every day | ORAL | 3 refills | Status: DC

## 2017-04-28 NOTE — Telephone Encounter (Signed)
Patient reports he is taking metoprolol 100 mg once daily. He reports bp and pulse are normal. New script sent to the pharmacy

## 2018-02-05 ENCOUNTER — Other Ambulatory Visit: Payer: Self-pay | Admitting: Nurse Practitioner

## 2018-02-05 ENCOUNTER — Other Ambulatory Visit: Payer: Self-pay | Admitting: Cardiology

## 2018-02-05 DIAGNOSIS — I10 Essential (primary) hypertension: Secondary | ICD-10-CM

## 2018-02-05 NOTE — Telephone Encounter (Signed)
REFILL 

## 2018-02-16 ENCOUNTER — Other Ambulatory Visit: Payer: Self-pay | Admitting: Cardiology

## 2018-04-23 ENCOUNTER — Other Ambulatory Visit: Payer: Self-pay | Admitting: Cardiology

## 2018-05-15 ENCOUNTER — Other Ambulatory Visit: Payer: Self-pay | Admitting: Cardiology

## 2018-05-15 DIAGNOSIS — I10 Essential (primary) hypertension: Secondary | ICD-10-CM

## 2018-05-17 NOTE — Telephone Encounter (Signed)
Rx sent to pharmacy   

## 2018-06-11 ENCOUNTER — Other Ambulatory Visit: Payer: Self-pay | Admitting: Cardiology

## 2018-06-21 ENCOUNTER — Telehealth: Payer: Self-pay | Admitting: Cardiology

## 2018-06-21 MED ORDER — ELIQUIS 5 MG PO TABS: ORAL_TABLET | ORAL | 0 refills | Status: DC

## 2018-06-21 NOTE — Telephone Encounter (Signed)
New Message:    Patient wife calling would like to know if her husband can get a refill for  Eliquis. He has  Made appt to see Dr.

## 2018-06-21 NOTE — Telephone Encounter (Signed)
Returned call. Patient overdue for appt.  Appt scheduled 10/8.  Refilled enough to last until OV.    Advised to keep appt for further refills.  Wife aware.

## 2018-06-28 ENCOUNTER — Other Ambulatory Visit: Payer: Self-pay | Admitting: Cardiology

## 2018-06-28 DIAGNOSIS — I10 Essential (primary) hypertension: Secondary | ICD-10-CM

## 2018-07-06 ENCOUNTER — Encounter: Payer: Self-pay | Admitting: Cardiology

## 2018-07-06 ENCOUNTER — Ambulatory Visit: Payer: BC Managed Care – PPO | Admitting: Cardiology

## 2018-07-06 VITALS — BP 142/88 | HR 51 | Ht 72.0 in | Wt 220.0 lb

## 2018-07-06 DIAGNOSIS — I10 Essential (primary) hypertension: Secondary | ICD-10-CM

## 2018-07-06 DIAGNOSIS — Z7901 Long term (current) use of anticoagulants: Secondary | ICD-10-CM

## 2018-07-06 DIAGNOSIS — N183 Chronic kidney disease, stage 3 unspecified: Secondary | ICD-10-CM

## 2018-07-06 DIAGNOSIS — I480 Paroxysmal atrial fibrillation: Secondary | ICD-10-CM

## 2018-07-06 DIAGNOSIS — I420 Dilated cardiomyopathy: Secondary | ICD-10-CM | POA: Diagnosis not present

## 2018-07-06 MED ORDER — SPIRONOLACTONE 25 MG PO TABS: 25.0000 mg | ORAL_TABLET | Freq: Every day | ORAL | 3 refills | Status: DC

## 2018-07-06 MED ORDER — METOPROLOL SUCCINATE ER 50 MG PO TB24: 50.0000 mg | ORAL_TABLET | Freq: Every day | ORAL | 3 refills | Status: DC

## 2018-07-06 MED ORDER — LISINOPRIL 10 MG PO TABS: 10.0000 mg | ORAL_TABLET | Freq: Every day | ORAL | 3 refills | Status: DC

## 2018-07-06 MED ORDER — ELIQUIS 5 MG PO TABS: ORAL_TABLET | ORAL | 3 refills | Status: DC

## 2018-07-06 MED ORDER — AMLODIPINE BESYLATE 10 MG PO TABS: 10.0000 mg | ORAL_TABLET | Freq: Every day | ORAL | 3 refills | Status: DC

## 2018-07-06 NOTE — Assessment & Plan Note (Signed)
His Last GFR was 45. He no longer has a PCP and hasn't had labs in > a year

## 2018-07-06 NOTE — Assessment & Plan Note (Signed)
EF 30-35% Feb 2018 Myoview low risk 2016

## 2018-07-06 NOTE — Patient Instructions (Signed)
Medication Instructions:   Stop Digoxin  Start Lisinopril 10 mg daily  Start Toprol 50 mg daily  Continue Eliquis,Aldactone,Amlodipine   If you need a refill on your cardiac medications before your next appointment, please call your pharmacy.   Lab work:  Today bmet,cbc  2 weeks bmet  If you have labs (blood work) drawn today and your tests are completely normal, you will receive your results only by: [name] MyChart Message (if you have MyChart) OR . A paper copy in the mail If you have any lab test that is abnormal or we need to change your treatment, we will call you to review the results.  Testing/Procedures:  None ordered  Follow-Up: At [facility], you and your health needs are our priority.  As part of our continuing mission to provide you with exceptional heart care, we have created designated Provider Care Teams.  These Care Teams include your primary Cardiologist (physician) and Advanced Practice Providers (APPs -  Physician Assistants and Nurse Practitioners) who all work together to provide you with the care you need, when you need it  Appointment scheduled with Corine Shelter PA Tuesday 07/27/18 at 11:30 am    Follow up with Dr.Crenshaw in 6 months call 2 months before to schedule

## 2018-07-06 NOTE — Assessment & Plan Note (Signed)
128/80 by me

## 2018-07-06 NOTE — Assessment & Plan Note (Signed)
CHADS VASC=2 on Eliquis 

## 2018-07-06 NOTE — Assessment & Plan Note (Signed)
Noted 09/2013. I suspect this is now CAF as he was in AF in March 2018 and again today. He tolerates this well.

## 2018-07-06 NOTE — Progress Notes (Signed)
07/06/2018 Javier Berry   October 10, 1960  [id number]  Primary Physician Thayer Headings, MD Primary Cardiologist: Dr Jens Som  HPI:  57 y/o AA male, works as a Surveyor, mining for [employer], with a history of NICM, HTN,CRI-3, and PAF (now CAF).  <Myoview in 2016 was low risk. His last Echo Feb 2018 showed an EF of 30-35%, mild LVH, diffuse HK, and severe LAE.  He is on Eliquis and needed a refill. His LOV was March 2018. Since we saw him last he has done well, no unusual dyspnea, orthopnea, or tachycardia. I reviewed his medications in detail with him- he had pictures of the bottles he has at home on his phone. It turns out the he was only taking -Lanoxin, Eliquis, Aldactone, and Norvasc. He had run out of lasix, Toprol, Imdur, and Lisinopril. He doesn't remember taking Hydralazine although that was also listed on his Med rec. He explained thet his PCP is no longer in practice and that he hasn't found a new provider yet. Fortunately he has not had any unusual dyspnea, edema, or orthopnea.    Current Outpatient Medications  Medication Sig Dispense Refill  . albuterol (PROVENTIL HFA;VENTOLIN HFA) 108 (90 Base) MCG/ACT inhaler Inhale 1-2 puffs into the lungs every 6 (six) hours as needed for wheezing or shortness of breath.    . allopurinol (ZYLOPRIM) 100 MG tablet Take 100 mg by mouth daily as needed (gout attacks).     [name] amLODipine (NORVASC) 10 MG tablet Take 1 tablet (10 mg total) by mouth daily. NEED OV. 90 tablet 0  . DIGOX 125 MCG tablet TAKE 1 TABLET BY MOUTH DAILY. 90 tablet 1  . ELIQUIS 5 MG TABS tablet TAKE 1 TABLET BY MOUTH 2 TIMES DAILY, keep appt for further refills. 28 tablet 0  . furosemide (LASIX) 40 MG tablet Take 1 tablet (40 mg total) by mouth daily. 90 tablet 3  . hydrALAZINE (APRESOLINE) 25 MG tablet Take 1 tablet by mouth daily.  99  . HYDROcodone-acetaminophen (NORCO/VICODIN) 5-325 MG tablet Take 1 tablet by mouth every 6 (six) hours as needed for pain.    .  isosorbide mononitrate (IMDUR) 30 MG 24 hr tablet TAKE 1 TABLET BY MOUTH DAILY FOR HEART 30 tablet 0  . lisinopril (PRINIVIL,ZESTRIL) 40 MG tablet Take 1 tablet (40 mg total) by mouth daily. 30 tablet 5  . metoprolol succinate (TOPROL-XL) 100 MG 24 hr tablet Take 1 tablet (100 mg total) by mouth daily. Take with or immediately following a meal. 90 tablet 3  . ranitidine (ZANTAC) 150 MG capsule Take once or twice a day as needed for your stomach (Patient taking differently: Take 150 mg by mouth daily as needed (stomach pain/ulcers). Take once or twice a day as needed for your stomach) 60 capsule 3  . spironolactone (ALDACTONE) 25 MG tablet Take 1 tablet (25 mg total) by mouth daily. 90 tablet 3   No current facility-administered medications for this visit.     No Known Allergies  Past Medical History:  Diagnosis Date  . Chronic systolic CHF (congestive heart failure) (HCC)    a. 01/2015 Echo: EF 35-40%;  b. 10/2016 Echo: EF 30-35%, diff HK, mild MR, sev dil LA/RA, mod TR, PASP .  . CKD (chronic kidney disease), stage III (HCC)   . Essential hypertension    a. with h/o HTN urgency in the setting of noncompliance.  . Gout   . NICM (nonischemic cardiomyopathy) (HCC)    a. 01/2015 Echo:  EF 35-40%;  b. 01/2015 Myoview: EF 30-44%, no ischemia/infarct;  c. 10/2016 Echo: EF 30-35%, diff HK.  [name] Persistent atrial fibrillation    a. CHA2DS2VASc = 2-->Eliquis;  b. s/p failed DCCV in 11/2013;  c. 10/2016 Echo: severe biatrial enlargement.  . PUD (peptic ulcer disease)     Social History   Socioeconomic History  . Marital status: Married    Spouse name: Not on file  . Number of children: 3  . Years of education: Not on file  . Highest education level: Not on file  Occupational History    Employer: [employer]  Social Needs  . Financial resource strain: Not on file  . Food insecurity:    Worry: Not on file    Inability: Not on file  . Transportation needs:    Medical: Not on file     Non-medical: Not on file  Tobacco Use  . Smoking status: Never Smoker  . Smokeless tobacco: Never Used  Substance and Sexual Activity  . Alcohol use: No    Comment: Occasional  . Drug use: No  . Sexual activity: Not on file  Lifestyle  . Physical activity:    Days per week: Not on file    Minutes per session: Not on file  . Stress: Not on file  Relationships  . Social connections:    Talks on phone: Not on file    Gets together: Not on file    Attends religious service: Not on file    Active member of club or organization: Not on file    Attends meetings of clubs or organizations: Not on file    Relationship status: Not on file  . Intimate partner violence:    Fear of current or ex partner: Not on file    Emotionally abused: Not on file    Physically abused: Not on file    Forced sexual activity: Not on file  Other Topics Concern  . Not on file  Social History Narrative  . Not on file     Family History  Problem Relation Age of Onset  . Kidney disease Mother   . Diabetes Mother   . Hypertension Father   . Congestive Heart Failure Father   . Kidney disease Sister   . Stroke Sister   . Drug abuse Brother   . Heart disease Brother   . Heart murmur Brother   . Colon cancer Neg Hx      Review of Systems: General: negative for chills, fever, night sweats or weight changes.  Cardiovascular: negative for chest pain, dyspnea on exertion, edema, orthopnea, palpitations, paroxysmal nocturnal dyspnea or shortness of breath Dermatological: negative for rash Respiratory: negative for cough or wheezing Urologic: negative for hematuria Abdominal: negative for nausea, vomiting, diarrhea, bright red blood per rectum, melena, or hematemesis Neurologic: negative for visual changes, syncope, or dizziness All other systems reviewed and are otherwise negative except as noted above.    Blood pressure (!) 142/88, pulse (!) 51, height 6' (1.829 m), weight 220 lb (99.8 kg), SpO2  97 %.  General appearance: alert, cooperative and no distress Neck: no carotid bruit and no JVD Lungs: clear to auscultation bilaterally Heart: regular rate and rhythm Extremities: no edema Skin: warm and dry Neurologic: Grossly normal  EKG AF with VR 97  ASSESSMENT AND PLAN:   PAF (paroxysmal atrial fibrillation) (HCC) Noted 09/2013. I suspect this is now CAF as he was in AF in March 2018 and again today. He tolerates  this well.  Chronic anticoagulation CHADS VASC=2- on Eliquis  CKD (chronic kidney disease) stage 3, GFR 30-59 ml/min His Last GFR was 45. He no longer has a PCP and hasn't had labs in > a year  Essential hypertension 128/80 by me  Congestive dilated cardiomyopathy (HCC) EF 30-35% Feb 2018 Myoview low risk 2016   PLAN  I reviewed his medications in detail. I will stop his digoxin, Imdur, and Hydralazine. I will continue Norvasc, Aldactone, and Eliquis. I'll resume Toprol at 50 mg and lisinopril at 10 mg. He'll need a BMP and CBC today. I'll see him back in 3-4 weeks. Once his medications are stabilized we'll consider a repeat echo in a couple of months, he may be a candidate for Entresto or possibly an ICD depending on his LVF. We discussed the importance of a low sodium diet.  Corine Shelter PA-C 07/06/2018 11:32 AM

## 2018-07-07 LAB — CBC WITH DIFFERENTIAL/PLATELET
Basophils Absolute: 0 10*3/uL (ref 0.0–0.2)
Basos: 1 %
EOS (ABSOLUTE): 0.4 10*3/uL (ref 0.0–0.4)
Eos: 7 %
Hematocrit: 43.4 % (ref 37.5–51.0)
Hemoglobin: 14.6 g/dL (ref 13.0–17.7)
Immature Grans (Abs): 0 10*3/uL (ref 0.0–0.1)
Immature Granulocytes: 1 %
Lymphocytes Absolute: 1.8 10*3/uL (ref 0.7–3.1)
Lymphs: 36 %
MCH: 30.7 pg (ref 26.6–33.0)
MCHC: 33.6 g/dL (ref 31.5–35.7)
MCV: 91 fL (ref 79–97)
Monocytes Absolute: 0.4 10*3/uL (ref 0.1–0.9)
Monocytes: 7 %
Neutrophils Absolute: 2.5 10*3/uL (ref 1.4–7.0)
Neutrophils: 48 %
Platelets: 163 10*3/uL (ref 150–450)
RBC: 4.75 x10E6/uL (ref 4.14–5.80)
RDW: 13.9 % (ref 12.3–15.4)
WBC: 5.2 10*3/uL (ref 3.4–10.8)

## 2018-07-07 LAB — BASIC METABOLIC PANEL
BUN/Creatinine Ratio: 10 (ref 9–20)
BUN: 14 mg/dL (ref 6–24)
CO2: 22 mmol/L (ref 20–29)
Calcium: 9.5 mg/dL (ref 8.7–10.2)
Chloride: 106 mmol/L (ref 96–106)
Creatinine, Ser: 1.35 mg/dL — ABNORMAL HIGH (ref 0.76–1.27)
GFR calc Af Amer: 66 mL/min/{1.73_m2} (ref >59)
GFR calc non Af Amer: 57 mL/min/{1.73_m2} — ABNORMAL LOW (ref >59)
Glucose: 93 mg/dL (ref 65–99)
Potassium: 4.5 mmol/L (ref 3.5–5.2)
Sodium: 145 mmol/L — ABNORMAL HIGH (ref 134–144)

## 2018-07-27 ENCOUNTER — Ambulatory Visit: Payer: BC Managed Care – PPO | Admitting: Cardiology

## 2018-07-28 ENCOUNTER — Encounter: Payer: Self-pay | Admitting: *Deleted

## 2018-08-04 IMAGING — CT CT ABD-PELV W/ CM
2 of 5 series · 15 of 46 positions shown, 17 images · IV contrast (APPLIED)
Comparison: None.

CLINICAL DATA: Periumbilical pain with episodes of emesis for 2
days.

EXAM:
CT ABDOMEN AND PELVIS WITH CONTRAST
TECHNIQUE: Multidetector CT imaging of the abdomen and pelvis was performed
using the standard protocol following bolus administration of
intravenous contrast.
CONTRAST:  100ml [id number] IOPAMIDOL ([id number]) INJECTION 61%

[Series 2: abd/ pelvis 5.0 i30f 1 · axial · 0.74mm/px · z∈[-499,-64]mm · 12 of 97 slices shown, 14 images]
[im 5/97  soft-tissue]
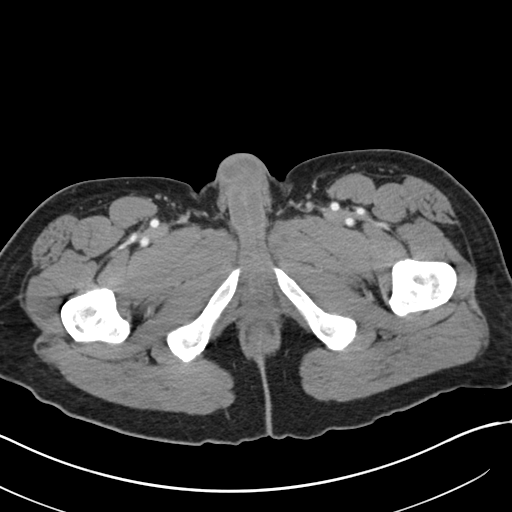
[im 5/97  bone]
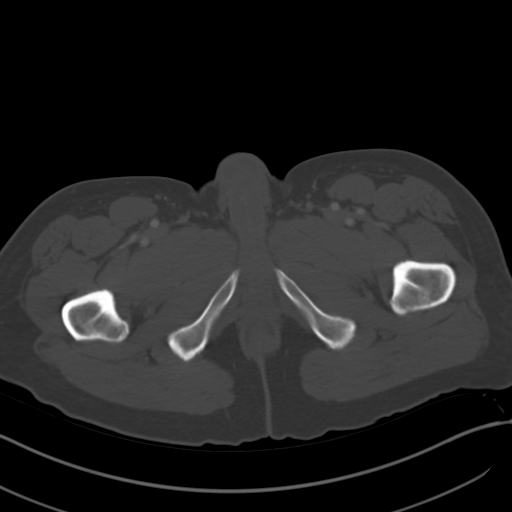
[im 15/97  soft-tissue]
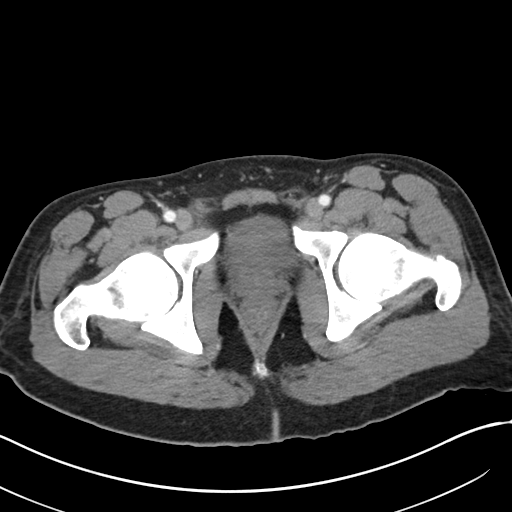
[im 20/97  soft-tissue]
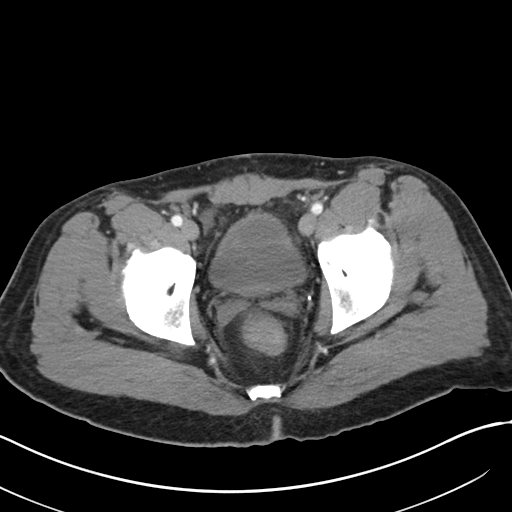
[im 29/97  soft-tissue]
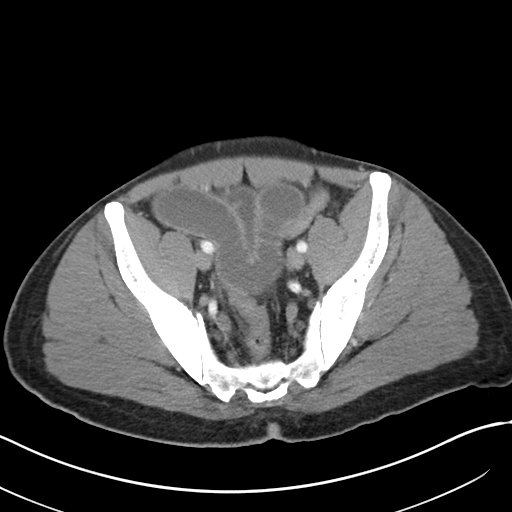
[im 39/97  soft-tissue]
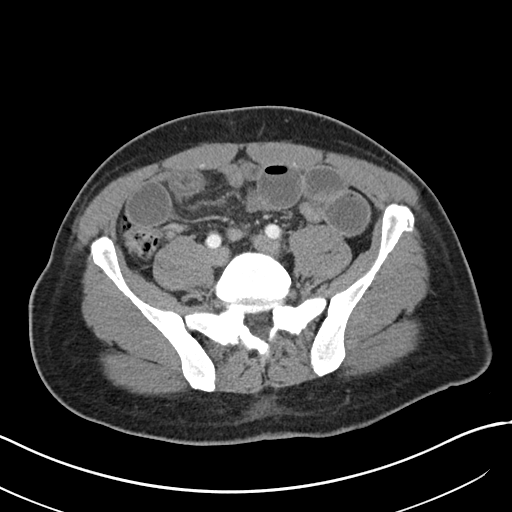
[im 44/97  soft-tissue]
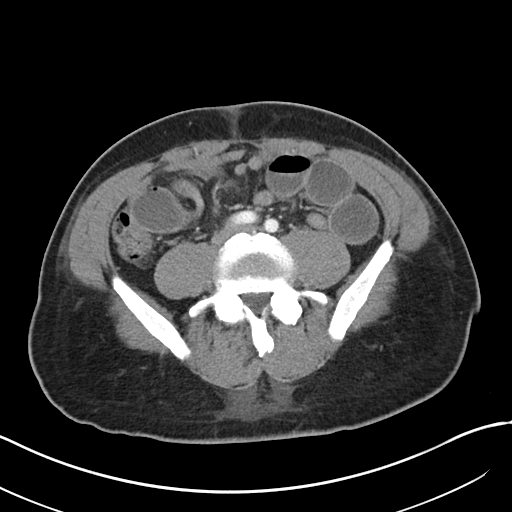
[im 53/97  soft-tissue]
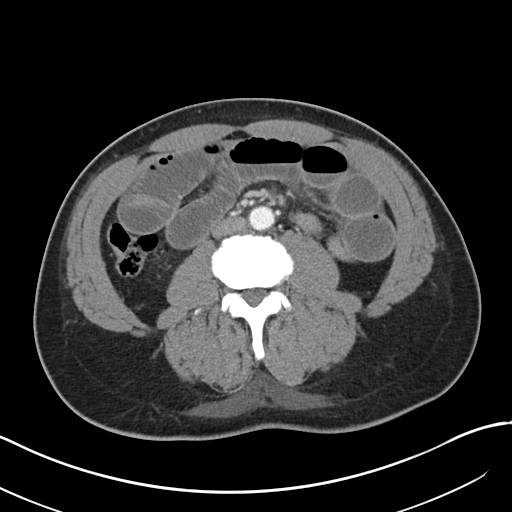
[im 58/97  soft-tissue]
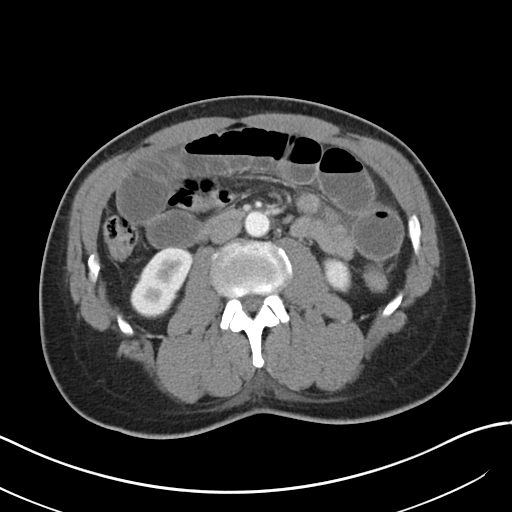
[im 68/97  soft-tissue]
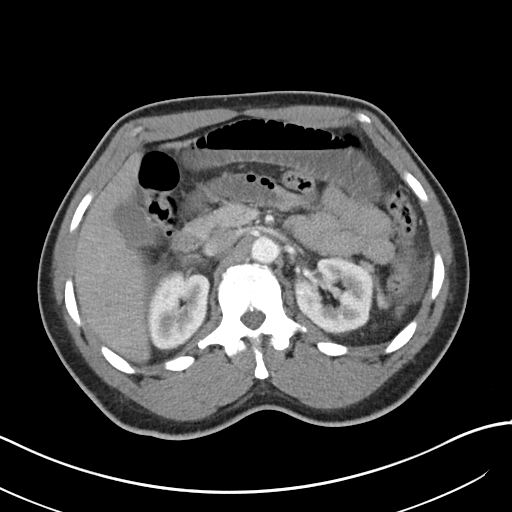
[im 68/97  bone]
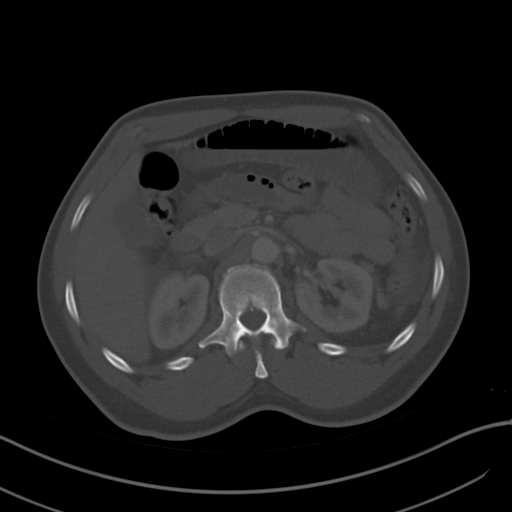
[im 77/97  soft-tissue]
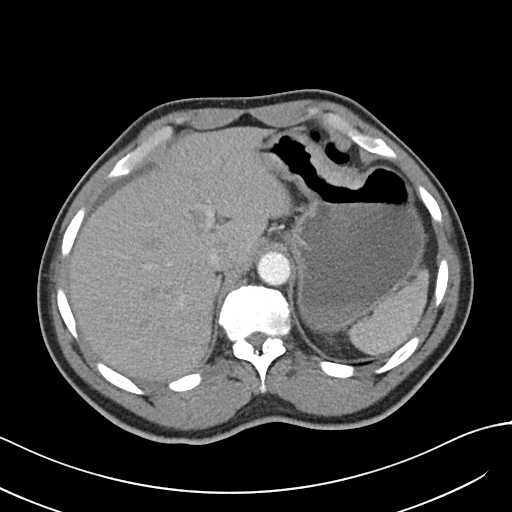
[im 82/97  soft-tissue]
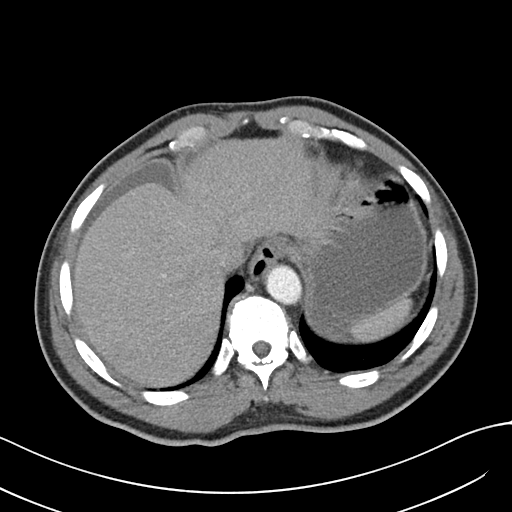
[im 92/97  soft-tissue]
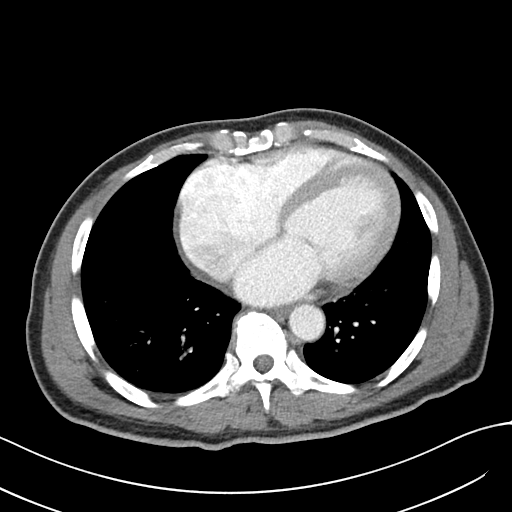

[Series 5: coronal soft tissue · coronal · 0.63mm/px · 3 of 85 slices shown]
[im 29/85  soft-tissue]
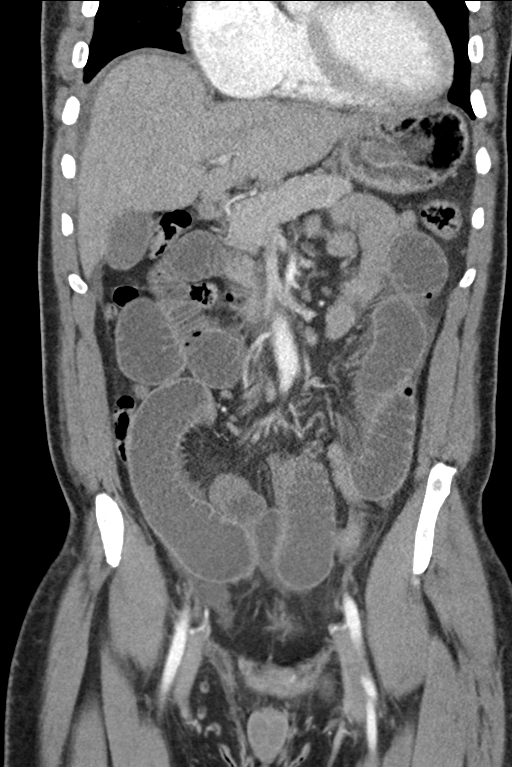
[im 38/85  soft-tissue]
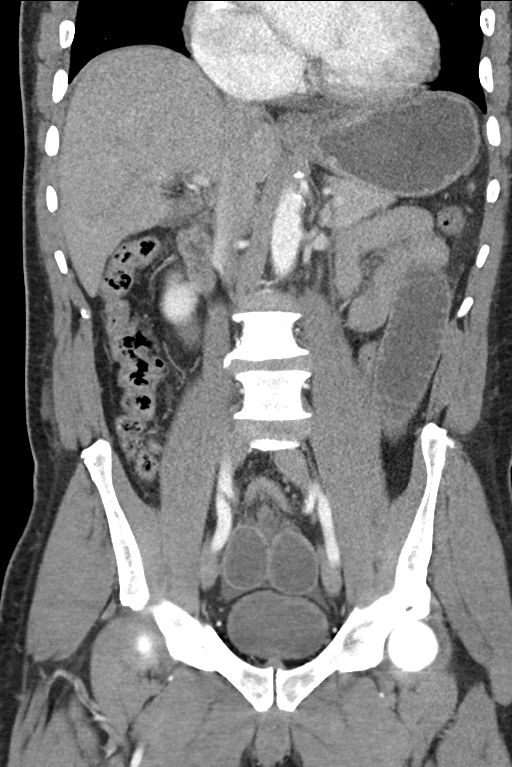
[im 47/85  soft-tissue]
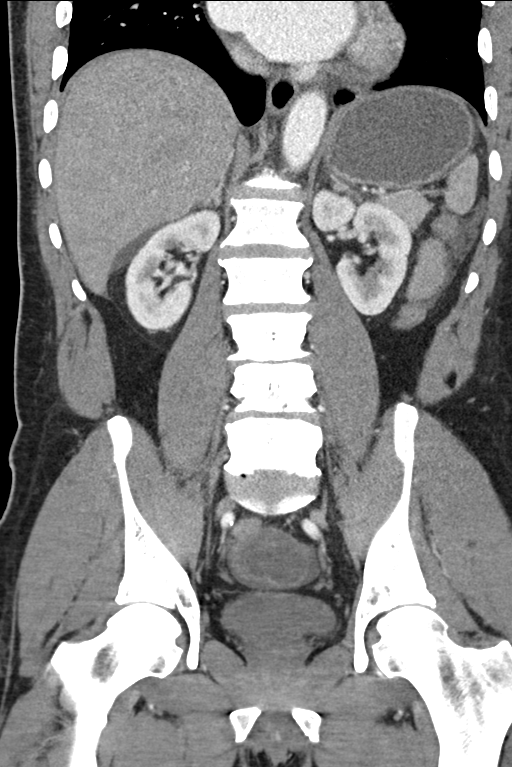

[15 of 46 positions shown; findings below may reference images not displayed]

FINDINGS: Lower chest: No acute abnormality.

Hepatobiliary: No focal liver abnormality is seen. No gallstones,
gallbladder wall thickening, or biliary dilatation.

Pancreas: Unremarkable. No pancreatic ductal dilatation or
surrounding inflammatory changes.

Spleen: Normal in size without focal abnormality.

Adrenals/Urinary Tract: Adrenal glands are unremarkable. Kidneys are
normal, without renal calculi, focal lesion, or hydronephrosis.
Bladder is unremarkable.

Stomach/Bowel: Dilatation of the stomach and small bowel with abrupt
transition to decompressed ileum. At the transition point in the
right lower quadrant, no mass or hernia is evident. Colon is
decompressed. Appendix is normal.

Vascular/Lymphatic: The abdominal aorta is normal in caliber with
mild atherosclerotic calcification.

Reproductive: Unremarkable

Other: Small volume ascites, perihepatic and in the dependent
pelvis.

Musculoskeletal: No significant skeletal lesion.
IMPRESSION: Dilatation of small bowel with caliber transition in the ileum,
consistent with a small bowel obstruction. No mass or hernia is
evident at the transition point. Small volume ascites.

## 2019-02-22 ENCOUNTER — Encounter: Payer: Self-pay | Admitting: Cardiology

## 2019-02-22 ENCOUNTER — Telehealth: Payer: Self-pay | Admitting: *Deleted

## 2019-02-22 NOTE — Telephone Encounter (Signed)
A message was left, re: follow up visit. 

## 2019-02-22 NOTE — Telephone Encounter (Signed)
error 

## 2019-04-07 NOTE — Progress Notes (Signed)
HPI: FU atrial fibrillation.  Patient has been seen by various APPs;  I last saw him personally in 2015.  Echocardiogram in February 2015 showed normal LV function and mild to moderate biatrial enlargement. TSH January 2015 normal.  Found to have reduced LV function 2016.  Nuclear study May 2016 showed moderate LV dysfunction but normal perfusion.  Last echo February 2018 showed ejection fraction 30 to 35%, mild left ventricular hypertrophy, mild mitral regurgitation, severe biatrial enlargement and moderate tricuspid regurgitation.  Since that time, patient has dyspnea with more vigorous activities.  No orthopnea, PND, pedal edema, chest pain, palpitations, syncope or bleeding.  Current Outpatient Medications  Medication Sig Dispense Refill  . amLODipine (NORVASC) 10 MG tablet Take 1 tablet (10 mg total) by mouth daily. 90 tablet 3  . ELIQUIS 5 MG TABS tablet TAKE 1 TABLET BY MOUTH 2 TIMES DAILY 60 tablet 3  . lisinopril (PRINIVIL,ZESTRIL) 10 MG tablet Take 1 tablet (10 mg total) by mouth daily. 90 tablet 3  . metoprolol succinate (TOPROL-XL) 50 MG 24 hr tablet Take 50 mg by mouth daily. Take with or immediately following a meal.    . spironolactone (ALDACTONE) 25 MG tablet Take 1 tablet (25 mg total) by mouth daily. 90 tablet 3   No current facility-administered medications for this visit.      Past Medical History:  Diagnosis Date  . Chronic systolic CHF (congestive heart failure) ([place])    a. 01/2015 Echo: EF 35-40%;  b. 10/2016 Echo: EF 30-35%, diff HK, mild MR, sev dil LA/RA, mod TR, PASP 27mmHg.  . CKD (chronic kidney disease), stage III ([place])   . Essential hypertension    a. with h/o HTN urgency in the setting of noncompliance.  . Gout   . NICM (nonischemic cardiomyopathy) ([place])    a. 01/2015 Echo: EF 35-40%;  b. 01/2015 Myoview: EF 30-44%, no ischemia/infarct;  c. 10/2016 Echo: EF 30-35%, diff HK.  [name] Persistent atrial fibrillation    a. CHA2DS2VASc = 2-->Eliquis;  b. s/p failed  DCCV in 11/2013;  c. 10/2016 Echo: severe biatrial enlargement.  . PUD (peptic ulcer disease)     Past Surgical History:  Procedure Laterality Date  . CARDIOVERSION N/A 12/20/2013   Procedure: CARDIOVERSION;  Surgeon: Lelon Perla, MD;  Location: [facility] ENDOSCOPY;  Service: Cardiovascular;  Laterality: N/A;  . TOE SURGERY      Social History   Socioeconomic History  . Marital status: Married    Spouse name: Not on file  . Number of children: 3  . Years of education: Not on file  . Highest education level: Not on file  Occupational History    Employer: [employer]  Social Needs  . Financial resource strain: Not on file  . Food insecurity    Worry: Not on file    Inability: Not on file  . Transportation needs    Medical: Not on file    Non-medical: Not on file  Tobacco Use  . Smoking status: Never Smoker  . Smokeless tobacco: Never Used  Substance and Sexual Activity  . Alcohol use: No    Comment: Occasional  . Drug use: No  . Sexual activity: Not on file  Lifestyle  . Physical activity    Days per week: Not on file    Minutes per session: Not on file  . Stress: Not on file  Relationships  . Social connections    Talks on phone: Not on file  Gets together: Not on file    Attends religious service: Not on file    Active member of club or organization: Not on file    Attends meetings of clubs or organizations: Not on file    Relationship status: Not on file  . Intimate partner violence    Fear of current or ex partner: Not on file    Emotionally abused: Not on file    Physically abused: Not on file    Forced sexual activity: Not on file  Other Topics Concern  . Not on file  Social History Narrative  . Not on file    Family History  Problem Relation Age of Onset  . Kidney disease Mother   . Diabetes Mother   . Hypertension Father   . Congestive Heart Failure Father   . Kidney disease Sister   . Stroke Sister   . Drug abuse Brother   . Heart  disease Brother   . Heart murmur Brother   . Colon cancer Neg Hx     ROS: no fevers or chills, productive cough, hemoptysis, dysphasia, odynophagia, melena, hematochezia, dysuria, hematuria, rash, seizure activity, orthopnea, PND, pedal edema, claudication. Remaining systems are negative.  Physical Exam: Well-developed well-nourished in no acute distress.  Skin is warm and dry.  HEENT is normal.  Neck is supple.  Chest is clear to auscultation with normal expansion.  Cardiovascular exam is irregular Abdominal exam nontender or distended. No masses palpated. Extremities show no edema. neuro grossly intact  ECG-atrial fibrillation with PVCs or aberrantly conducted beats, nonspecific ST changes.  Personally reviewed  A/P  1 permanent atrial fibrillation-continue beta-blocker for rate control.  Continue apixaban.  Check CBC and bmet.  Check 24-hour Holter monitor to make sure that rate is adequately controlled.  2 nonischemic cardiomyopathy-felt likely related to history of hypertension and possible tachycardia mediated.  Blood pressure is controlled; however given reduced LV function previously I would prefer that he be on higher dose ACE inhibitor.  Decrease amlodipine to 5 mg daily and increase lisinopril to 20 mg daily.  Check potassium and renal function in 1 week.  Repeat echocardiogram.  If ejection fraction less than 35% would need to consider ICD.  Would also likely need to rule out ischemia with cardiac catheterization.  3 hypertension-blood pressure is controlled.  Plan changes as outlined above.  4 chronic stage III kidney disease-check potassium and renal function.  [name], MD

## 2019-04-18 ENCOUNTER — Telehealth: Payer: Self-pay | Admitting: Cardiology

## 2019-04-18 NOTE — Telephone Encounter (Signed)
I called pt to confirm his appt for 04-19-19 with Dr Stanford Breed, I left a mesage.

## 2019-04-19 ENCOUNTER — Encounter: Payer: Self-pay | Admitting: Cardiology

## 2019-04-19 ENCOUNTER — Encounter (INDEPENDENT_AMBULATORY_CARE_PROVIDER_SITE_OTHER): Payer: Self-pay

## 2019-04-19 ENCOUNTER — Other Ambulatory Visit: Payer: Self-pay

## 2019-04-19 ENCOUNTER — Ambulatory Visit: Payer: BC Managed Care – PPO | Admitting: Cardiology

## 2019-04-19 VITALS — BP 118/81 | HR 94 | Ht 72.0 in | Wt 222.0 lb

## 2019-04-19 DIAGNOSIS — N183 Chronic kidney disease, stage 3 unspecified: Secondary | ICD-10-CM

## 2019-04-19 DIAGNOSIS — I420 Dilated cardiomyopathy: Secondary | ICD-10-CM

## 2019-04-19 DIAGNOSIS — I10 Essential (primary) hypertension: Secondary | ICD-10-CM | POA: Diagnosis not present

## 2019-04-19 DIAGNOSIS — I480 Paroxysmal atrial fibrillation: Secondary | ICD-10-CM | POA: Diagnosis not present

## 2019-04-19 MED ORDER — LISINOPRIL 20 MG PO TABS: 20.0000 mg | ORAL_TABLET | Freq: Every day | ORAL | 3 refills | Status: DC

## 2019-04-19 MED ORDER — AMLODIPINE BESYLATE 5 MG PO TABS: 5.0000 mg | ORAL_TABLET | Freq: Every day | ORAL | 3 refills | Status: DC

## 2019-04-19 NOTE — Patient Instructions (Signed)
Medication Instructions:  DECREASE AMLODIPINE TO 5 MG ONCE DAILY= 1/2 OF THE 10 MG TABLET ONCE DAILY  INCREASE LISINOPRIL TO 20 MG ONCE DAILY= 2 OF THE 10 MG TABLETS ONCE DAILY  CIALIS  If you need a refill on your cardiac medications before your next appointment, please call your pharmacy.   Lab work: Your physician recommends that you return for lab work in: [place] If you have labs (blood work) drawn today and your tests are completely normal, you will receive your results only by: [name] MyChart Message (if you have MyChart) OR . A paper copy in the mail If you have any lab test that is abnormal or we need to change your treatment, we will call you to review the results.  Testing/Procedures: Your physician has requested that you have an echocardiogram. Echocardiography is a painless test that uses sound waves to create images of your heart. It provides your doctor with information about the size and shape of your heart and how well your heart's chambers and valves are working. This procedure takes approximately one hour. There are no restrictions for this procedure.[place] has recommended that you wear a 24 HOUR holter monitor. Holter monitors are medical devices that record the heart's electrical activity. Doctors most often use these monitors to diagnose arrhythmias. Arrhythmias are problems with the speed or rhythm of the heartbeat. The monitor is a small, portable device. You can wear one while you do your normal daily activities. This is usually used to diagnose what is causing palpitations/syncope (passing out).WILL BE MAILED TO YOU  Follow-Up: At [facility], you and your health needs are our priority.  As part of our continuing mission to provide you with exceptional heart care, we have created designated Provider Care Teams.  These Care Teams include your primary Cardiologist (physician) and Advanced Practice Providers (APPs -  Physician Assistants  and Nurse Practitioners) who all work together to provide you with the care you need, when you need it. Your physician recommends that you schedule a follow-up appointment in: [place]

## 2019-04-20 ENCOUNTER — Telehealth: Payer: Self-pay | Admitting: *Deleted

## 2019-04-20 NOTE — Telephone Encounter (Signed)
3 day ZIO XT long term holter monitor to be mailed to the patients home.  Instructions reviewed briefly as they are included in the monitor kit.  Patient to wear monitor at least 24 hours.

## 2019-04-25 ENCOUNTER — Other Ambulatory Visit: Payer: Self-pay | Admitting: Cardiology

## 2019-04-25 NOTE — Telephone Encounter (Signed)
Refill Request.  

## 2019-04-26 ENCOUNTER — Ambulatory Visit (HOSPITAL_COMMUNITY): Payer: BC Managed Care – PPO | Attending: Internal Medicine

## 2019-04-26 ENCOUNTER — Other Ambulatory Visit: Payer: Self-pay

## 2019-04-26 ENCOUNTER — Other Ambulatory Visit (INDEPENDENT_AMBULATORY_CARE_PROVIDER_SITE_OTHER): Payer: BC Managed Care – PPO

## 2019-04-26 DIAGNOSIS — I420 Dilated cardiomyopathy: Secondary | ICD-10-CM | POA: Insufficient documentation

## 2019-04-26 DIAGNOSIS — I480 Paroxysmal atrial fibrillation: Secondary | ICD-10-CM | POA: Diagnosis not present

## 2019-04-28 ENCOUNTER — Encounter: Payer: Self-pay | Admitting: *Deleted

## 2019-05-11 ENCOUNTER — Encounter: Payer: Self-pay | Admitting: *Deleted

## 2019-05-26 ENCOUNTER — Telehealth: Payer: Self-pay | Admitting: Cardiology

## 2019-05-26 NOTE — Telephone Encounter (Signed)
Pt calling stating that Dr. Stanford Breed put him on CIALIS. I do not see this medication listed on pt's med list. Pt would like a call back concerning this matter. Please address

## 2019-05-26 NOTE — Telephone Encounter (Signed)
Unable to reach pt or leave a message, he will need to get from PCP.

## 2019-07-27 ENCOUNTER — Other Ambulatory Visit: Payer: Self-pay | Admitting: Cardiology

## 2019-07-27 DIAGNOSIS — I10 Essential (primary) hypertension: Secondary | ICD-10-CM

## 2019-08-02 ENCOUNTER — Telehealth (INDEPENDENT_AMBULATORY_CARE_PROVIDER_SITE_OTHER): Payer: BC Managed Care – PPO | Admitting: Cardiology

## 2019-08-02 DIAGNOSIS — I480 Paroxysmal atrial fibrillation: Secondary | ICD-10-CM

## 2019-08-02 NOTE — Progress Notes (Signed)
ERROR

## 2019-09-20 ENCOUNTER — Other Ambulatory Visit: Payer: Self-pay | Admitting: Cardiology

## 2019-09-28 ENCOUNTER — Other Ambulatory Visit: Payer: Self-pay | Admitting: Cardiology

## 2019-12-19 ENCOUNTER — Other Ambulatory Visit: Payer: Self-pay | Admitting: Cardiology

## 2020-08-31 ENCOUNTER — Other Ambulatory Visit: Payer: Self-pay | Admitting: Cardiology

## 2020-08-31 NOTE — Telephone Encounter (Signed)
Prescription refill request for Eliquis received. Indication: Atrial Fibrillation Last office visit: 07/2019; needs appt Scr: needs labs Age: 59 Weight: 100.7 kg   Prescription refilled

## 2020-10-06 ENCOUNTER — Other Ambulatory Visit: Payer: Self-pay | Admitting: Cardiology

## 2020-10-09 ENCOUNTER — Other Ambulatory Visit: Payer: Self-pay | Admitting: Cardiology

## 2020-10-12 ENCOUNTER — Other Ambulatory Visit: Payer: Self-pay | Admitting: Cardiology

## 2022-04-02 ENCOUNTER — Other Ambulatory Visit: Payer: Self-pay

## 2022-04-02 ENCOUNTER — Encounter (HOSPITAL_COMMUNITY): Payer: Self-pay

## 2022-04-02 ENCOUNTER — Emergency Department (HOSPITAL_COMMUNITY): Payer: BC Managed Care – PPO

## 2022-04-02 ENCOUNTER — Inpatient Hospital Stay (HOSPITAL_COMMUNITY)
Admission: EM | Admit: 2022-04-02 | Discharge: 2022-04-06 | DRG: 291 | Disposition: A | Discharge status: Home / Self Care | Payer: BC Managed Care – PPO | Attending: Internal Medicine | Admitting: Internal Medicine

## 2022-04-02 DIAGNOSIS — Z7901 Long term (current) use of anticoagulants: Secondary | ICD-10-CM

## 2022-04-02 DIAGNOSIS — I472 Ventricular tachycardia, unspecified: Secondary | ICD-10-CM | POA: Diagnosis present

## 2022-04-02 DIAGNOSIS — I4821 Permanent atrial fibrillation: Secondary | ICD-10-CM | POA: Diagnosis present

## 2022-04-02 DIAGNOSIS — I4891 Unspecified atrial fibrillation: Secondary | ICD-10-CM | POA: Diagnosis not present

## 2022-04-02 DIAGNOSIS — M109 Gout, unspecified: Secondary | ICD-10-CM | POA: Diagnosis present

## 2022-04-02 DIAGNOSIS — Z833 Family history of diabetes mellitus: Secondary | ICD-10-CM

## 2022-04-02 DIAGNOSIS — I130 Hypertensive heart and chronic kidney disease with heart failure and stage 1 through stage 4 chronic kidney disease, or unspecified chronic kidney disease: Secondary | ICD-10-CM | POA: Diagnosis not present

## 2022-04-02 DIAGNOSIS — Z6832 Body mass index (BMI) 32.0-32.9, adult: Secondary | ICD-10-CM

## 2022-04-02 DIAGNOSIS — I428 Other cardiomyopathies: Secondary | ICD-10-CM | POA: Diagnosis present

## 2022-04-02 DIAGNOSIS — Z841 Family history of disorders of kidney and ureter: Secondary | ICD-10-CM

## 2022-04-02 DIAGNOSIS — I5043 Acute on chronic combined systolic (congestive) and diastolic (congestive) heart failure: Secondary | ICD-10-CM

## 2022-04-02 DIAGNOSIS — Z7951 Long term (current) use of inhaled steroids: Secondary | ICD-10-CM

## 2022-04-02 DIAGNOSIS — I10 Essential (primary) hypertension: Secondary | ICD-10-CM | POA: Diagnosis present

## 2022-04-02 DIAGNOSIS — R739 Hyperglycemia, unspecified: Secondary | ICD-10-CM | POA: Diagnosis present

## 2022-04-02 DIAGNOSIS — Z8249 Family history of ischemic heart disease and other diseases of the circulatory system: Secondary | ICD-10-CM

## 2022-04-02 DIAGNOSIS — Z91148 Patient's other noncompliance with medication regimen for other reason: Secondary | ICD-10-CM

## 2022-04-02 DIAGNOSIS — I509 Heart failure, unspecified: Secondary | ICD-10-CM

## 2022-04-02 DIAGNOSIS — Z79899 Other long term (current) drug therapy: Secondary | ICD-10-CM

## 2022-04-02 DIAGNOSIS — I5023 Acute on chronic systolic (congestive) heart failure: Secondary | ICD-10-CM | POA: Diagnosis present

## 2022-04-02 DIAGNOSIS — F32A Depression, unspecified: Secondary | ICD-10-CM | POA: Diagnosis present

## 2022-04-02 DIAGNOSIS — N183 Chronic kidney disease, stage 3 unspecified: Secondary | ICD-10-CM | POA: Diagnosis present

## 2022-04-02 DIAGNOSIS — E876 Hypokalemia: Secondary | ICD-10-CM | POA: Diagnosis present

## 2022-04-02 DIAGNOSIS — I5022 Chronic systolic (congestive) heart failure: Secondary | ICD-10-CM

## 2022-04-02 DIAGNOSIS — N1831 Chronic kidney disease, stage 3a: Secondary | ICD-10-CM | POA: Diagnosis present

## 2022-04-02 DIAGNOSIS — E669 Obesity, unspecified: Secondary | ICD-10-CM | POA: Diagnosis present

## 2022-04-02 LAB — CBC
HCT: 41.2 % (ref 39.0–52.0)
Hemoglobin: 14.1 g/dL (ref 13.0–17.0)
MCH: 32.9 pg (ref 26.0–34.0)
MCHC: 34.2 g/dL (ref 30.0–36.0)
MCV: 96 fL (ref 80.0–100.0)
Platelets: 167 10*3/uL (ref 150–400)
RBC: 4.29 MIL/uL (ref 4.22–5.81)
RDW: 15.3 % (ref 11.5–15.5)
WBC: 5.7 10*3/uL (ref 4.0–10.5)
nRBC: 0 % (ref 0.0–0.2)

## 2022-04-02 LAB — LIPID PANEL
Cholesterol: 121 mg/dL (ref 0–200)
HDL: 24 mg/dL — ABNORMAL LOW (ref >40)
LDL Cholesterol: 83 mg/dL (ref 0–99)
Total CHOL/HDL Ratio: 5 RATIO
Triglycerides: 71 mg/dL (ref <150)
VLDL: 14 mg/dL (ref 0–40)

## 2022-04-02 LAB — BASIC METABOLIC PANEL
Anion gap: 9 (ref 5–15)
BUN: 16 mg/dL (ref 8–23)
CO2: 22 mmol/L (ref 22–32)
Calcium: 8.9 mg/dL (ref 8.9–10.3)
Chloride: 111 mmol/L (ref 98–111)
Creatinine, Ser: 1.51 mg/dL — ABNORMAL HIGH (ref 0.61–1.24)
GFR, Estimated: 52 mL/min — ABNORMAL LOW (ref >60)
Glucose, Bld: 152 mg/dL — ABNORMAL HIGH (ref 70–99)
Potassium: 3.7 mmol/L (ref 3.5–5.1)
Sodium: 142 mmol/L (ref 135–145)

## 2022-04-02 LAB — HEMOGLOBIN A1C
Hgb A1c MFr Bld: 6.2 % — ABNORMAL HIGH (ref 4.8–5.6)
Mean Plasma Glucose: 131.24 mg/dL

## 2022-04-02 LAB — MAGNESIUM: Magnesium: 1.9 mg/dL (ref 1.7–2.4)

## 2022-04-02 LAB — HEPATIC FUNCTION PANEL
ALT: 16 U/L (ref 0–44)
AST: 28 U/L (ref 15–41)
Albumin: 3 g/dL — ABNORMAL LOW (ref 3.5–5.0)
Alkaline Phosphatase: 57 U/L (ref 38–126)
Bilirubin, Direct: 0.5 mg/dL — ABNORMAL HIGH (ref 0.0–0.2)
Indirect Bilirubin: 1.4 mg/dL — ABNORMAL HIGH (ref 0.3–0.9)
Total Bilirubin: 1.9 mg/dL — ABNORMAL HIGH (ref 0.3–1.2)
Total Protein: 6.5 g/dL (ref 6.5–8.1)

## 2022-04-02 LAB — PROTIME-INR
INR: 1.3 — ABNORMAL HIGH (ref 0.8–1.2)
Prothrombin Time: 15.9 seconds — ABNORMAL HIGH (ref 11.4–15.2)

## 2022-04-02 LAB — HIV ANTIBODY (ROUTINE TESTING W REFLEX): HIV Screen 4th Generation wRfx: NONREACTIVE

## 2022-04-02 LAB — TSH: TSH: 1.733 u[IU]/mL (ref 0.350–4.500)

## 2022-04-02 LAB — BRAIN NATRIURETIC PEPTIDE: B Natriuretic Peptide: 444.1 pg/mL — ABNORMAL HIGH (ref 0.0–100.0)

## 2022-04-02 MED ORDER — SODIUM CHLORIDE 0.9% FLUSH
3.0000 mL | Freq: Two times a day (BID) | INTRAVENOUS | Status: DC
  Administered 2022-04-03 – 2022-04-06 (×6): 3 mL via INTRAVENOUS

## 2022-04-02 MED ORDER — FUROSEMIDE 10 MG/ML IJ SOLN
40.0000 mg | Freq: Two times a day (BID) | INTRAMUSCULAR | Status: DC
  Administered 2022-04-02 – 2022-04-05 (×6): 40 mg via INTRAVENOUS
  Filled 2022-04-02 (×6): qty 4

## 2022-04-02 MED ORDER — HYDRALAZINE HCL 20 MG/ML IJ SOLN: 5.0000 mg | INTRAMUSCULAR | Status: DC | PRN

## 2022-04-02 MED ORDER — METOPROLOL TARTRATE 25 MG PO TABS: 25.0000 mg | ORAL_TABLET | Freq: Three times a day (TID) | ORAL | Status: DC

## 2022-04-02 MED ORDER — ALLOPURINOL 100 MG PO TABS
100.0000 mg | ORAL_TABLET | Freq: Every day | ORAL | Status: DC
  Administered 2022-04-02 – 2022-04-06 (×5): 100 mg via ORAL
  Filled 2022-04-02 (×5): qty 1

## 2022-04-02 MED ORDER — SPIRONOLACTONE 25 MG PO TABS: 25.0000 mg | ORAL_TABLET | Freq: Every day | ORAL | Status: DC

## 2022-04-02 MED ORDER — DILTIAZEM LOAD VIA INFUSION
15.0000 mg | Freq: Once | INTRAVENOUS | Status: AC
  Administered 2022-04-02: 15 mg via INTRAVENOUS

## 2022-04-02 MED ORDER — APIXABAN 5 MG PO TABS
5.0000 mg | ORAL_TABLET | Freq: Two times a day (BID) | ORAL | Status: DC
Start: 2022-04-02 — End: 2022-04-02

## 2022-04-02 MED ORDER — METOPROLOL TARTRATE 50 MG PO TABS
50.0000 mg | ORAL_TABLET | Freq: Three times a day (TID) | ORAL | Status: DC
  Administered 2022-04-02 – 2022-04-04 (×5): 50 mg via ORAL
  Filled 2022-04-02 (×2): qty 2
  Filled 2022-04-02 (×3): qty 1

## 2022-04-02 MED ORDER — AMLODIPINE BESYLATE 5 MG PO TABS: 5.0000 mg | ORAL_TABLET | Freq: Every day | ORAL | Status: DC

## 2022-04-02 MED ORDER — ACETAMINOPHEN 650 MG RE SUPP: 650.0000 mg | Freq: Four times a day (QID) | RECTAL | Status: DC | PRN

## 2022-04-02 MED ORDER — ACETAMINOPHEN 325 MG PO TABS
650.0000 mg | ORAL_TABLET | Freq: Four times a day (QID) | ORAL | Status: DC | PRN
  Administered 2022-04-04 – 2022-04-05 (×2): 650 mg via ORAL
  Filled 2022-04-02 (×2): qty 2

## 2022-04-02 MED ORDER — DILTIAZEM HCL-DEXTROSE 125-5 MG/125ML-% IV SOLN (PREMIX)
5.0000 mg/h | INTRAVENOUS | Status: DC
  Administered 2022-04-02: 5 mg/h via INTRAVENOUS
  Administered 2022-04-02 – 2022-04-04 (×5): 15 mg/h via INTRAVENOUS
  Filled 2022-04-02 (×6): qty 125

## 2022-04-02 MED ORDER — DILTIAZEM LOAD VIA INFUSION
10.0000 mg | Freq: Once | INTRAVENOUS | Status: AC
  Administered 2022-04-02: 10 mg via INTRAVENOUS
  Filled 2022-04-02: qty 10

## 2022-04-02 MED ORDER — FUROSEMIDE 10 MG/ML IJ SOLN
40.0000 mg | Freq: Once | INTRAMUSCULAR | Status: AC
  Administered 2022-04-02: 40 mg via INTRAVENOUS
  Filled 2022-04-02: qty 4

## 2022-04-02 MED ORDER — ONDANSETRON HCL 4 MG/2ML IJ SOLN: 4.0000 mg | Freq: Four times a day (QID) | INTRAMUSCULAR | Status: DC | PRN

## 2022-04-02 MED ORDER — DILTIAZEM HCL 25 MG/5ML IV SOLN: 15.0000 mg | Freq: Once | INTRAVENOUS | Status: DC

## 2022-04-02 MED ORDER — APIXABAN 5 MG PO TABS
5.0000 mg | ORAL_TABLET | Freq: Two times a day (BID) | ORAL | Status: DC
  Administered 2022-04-02 – 2022-04-06 (×8): 5 mg via ORAL
  Filled 2022-04-02 (×8): qty 1

## 2022-04-02 MED ORDER — LISINOPRIL 20 MG PO TABS: 20.0000 mg | ORAL_TABLET | Freq: Every day | ORAL | Status: DC

## 2022-04-02 MED ORDER — ONDANSETRON HCL 4 MG PO TABS: 4.0000 mg | ORAL_TABLET | Freq: Four times a day (QID) | ORAL | Status: DC | PRN

## 2022-04-02 NOTE — ED Notes (Signed)
Per Ray MD, titrate cardizem gtt to 15mg /hr.

## 2022-04-02 NOTE — H&P (Signed)
History and Physical    Patient: Javier Berry DOB: Feb 26, 1961 DOA: 04/02/2022 DOS: the patient was seen and examined on 04/02/2022 PCP: Pcp, No  Patient coming from: Home - lives with wife and mother-in-law; Jackey Loge: Wife, [phone]    Chief Complaint: SOB  HPI: Javier Berry is a 61 y.o. male with medical history significant of chronic systolic CHF; stage 3 CKD; HTN; and afib on Eliquis presenting with SOB x 3 months.  He reports that his breathing has been bad, fluid was building up, and his energy levels have been terrible.  This has been waxing and waning for about 3 months.  His HR stays beating fast.  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  He hasn't re-established with cardiology yet.  He has continued to take Eliquis.  No CP.  Last night, he was coughing and took his fluid medicine but it didn't help.  He retired 2-3 weeks ago.    ER Course:  Afib with RVR, has h/o and now with SOB     Review of Systems: As mentioned in the history of present illness. All other systems reviewed and are negative. Past Medical History:  Diagnosis Date   Chronic systolic CHF (congestive heart failure) (HCC)    a. 01/2015 Echo: EF 35-40%;  b. 10/2016 Echo: EF 30-35%, diff HK, mild MR, sev dil LA/RA, mod TR, PASP .   CKD (chronic kidney disease), stage III (HCC)    Essential hypertension    a. with h/o HTN urgency in the setting of noncompliance.   Gout    NICM (nonischemic cardiomyopathy) (HCC)    a. 01/2015 Echo: EF 35-40%;  b. 01/2015 Myoview: EF 30-44%, no ischemia/infarct;  c. 10/2016 Echo: EF 30-35%, diff HK.   Persistent atrial fibrillation (HCC)    a. CHA2DS2VASc = 2-->Eliquis;  b. s/p failed DCCV in 11/2013;  c. 10/2016 Echo: severe biatrial enlargement.   PUD (peptic ulcer disease)    Past Surgical History:  Procedure Laterality Date   CARDIOVERSION N/A 12/20/2013   Procedure: CARDIOVERSION;  Surgeon: Lewayne Bunting, MD;  Location:  [facility] ENDOSCOPY;  Service: Cardiovascular;  Laterality: N/A;   TOE SURGERY     Social History:  reports that he has never smoked. He has never used smokeless tobacco. He reports that he does not drink alcohol and does not use drugs.  No Known Allergies  Family History  Problem Relation Age of Onset   Kidney disease Mother    Diabetes Mother    Hypertension Father    Congestive Heart Failure Father    Kidney disease Sister    Stroke Sister    Drug abuse Brother    Heart disease Brother    Heart murmur Brother    Colon cancer Neg Hx     Prior to Admission medications   Medication Sig Start Date End Date Taking? Authorizing Provider  acetaminophen (TYLENOL) 325 MG tablet Take 650 mg by mouth every 6 (six) hours as needed for mild pain.   Yes [provider]  allopurinol (ZYLOPRIM) 100 MG tablet Take 100 mg by mouth daily. 12/31/21  Yes [provider]  amLODipine (NORVASC) 5 MG tablet Take 1 tablet (5 mg total) by mouth daily. 04/19/19  Yes Crenshaw, Madolyn Frieze, MD  ELIQUIS 5 MG TABS tablet TAKE 1 TABLET BY MOUTH 2 TIMES DAILY pt needs appointment and labs 08/31/20  Yes Lewayne Bunting, MD  fluticasone furoate-vilanterol (BREO ELLIPTA) 100-25  MCG/ACT AEPB Inhale 1 puff into the lungs daily as needed (shortness of breath).   Yes [provider]  furosemide (LASIX) 40 MG tablet Take 40 mg by mouth daily. 02/17/22  Yes [provider]  ibuprofen (ADVIL) 200 MG tablet Take 200 mg by mouth every 6 (six) hours as needed for mild pain.   Yes [provider]  metoprolol succinate (TOPROL-XL) 25 MG 24 hr tablet Take 25 mg by mouth daily.   Yes [provider]  lisinopril (ZESTRIL) 20 MG tablet Take 1 tablet (20 mg total) by mouth daily. Patient not taking: Reported on 04/02/2022 04/19/19 07/18/19  Lelon Perla, MD  metoprolol succinate (TOPROL-XL) 50 MG 24 hr tablet TAKE 1 TABLET BY MOUTH DAILY. PLEASE SCHEDULE OV FOR FURTHER REFILLS. Patient not  taking: Reported on 04/02/2022 12/21/19   Lelon Perla, MD  spironolactone (ALDACTONE) 25 MG tablet TAKE 1 TABLET BY MOUTH DAILY. Patient not taking: Reported on 04/02/2022 09/28/19   Erlene Quan, [place]    Physical Exam: Vitals:   04/02/22 1605 04/02/22 1606 04/02/22 1630 04/02/22 1730  BP: (!) 127/95  130/88 129/85  Pulse: (!) 127  70   Resp: 19  11 20   Temp:      SpO2: 99% 99% 99% 99%  Weight:      Height:       General:  Appears calm and comfortable and is in NAD Eyes:  EOMI, normal lids, iris ENT:  grossly normal hearing, lips & tongue, mmm Neck:  no LAD, masses or thyromegaly Cardiovascular:  Irregularly irregular with significant tachycardia despite 15 Diltiazem. 2+ LE edema.  Respiratory:   CTA bilaterally with no wheezes/rales/rhonchi.  Normal respiratory effort. Abdomen:  soft, NT, ND Skin:  no rash or induration seen on limited exam Musculoskeletal:  grossly normal tone BUE/BLE, good ROM, no bony abnormality Psychiatric:  grossly normal mood and affect, speech fluent and appropriate, AOx3 Neurologic:  CN 2-12 grossly intact, moves all extremities in coordinated fashion   Radiological Exams on Admission: Independently reviewed - see discussion in A/P where applicable  DG Chest Port 1 View  Result Date: 04/02/2022 CLINICAL DATA:  Dyspnea, shortness of breath. EXAM: PORTABLE CHEST 1 VIEW COMPARISON:  Chest radiograph dated 11/06/2016. FINDINGS: The heart size is enlarged. Both lungs are clear. The visualized skeletal structures are unremarkable. IMPRESSION: Cardiomegaly.  No active disease. Electronically Signed   By: Zerita Boers M.D.   On: 04/02/2022 11:46    EKG: Independently reviewed.  Afib with rate 160; nonspecific ST changes with no evidence of acute ischemia   Labs on Admission: I have personally reviewed the available labs and imaging studies at the time of the admission.  Pertinent labs:    Glucose 152 BUUN 16/Creatinine 1.51/GFR 52 Normal CBC INR  1.3   Assessment and Plan: Principal Problem:   Atrial fibrillation with RVR (HCC) Active Problems:   Essential hypertension   CKD (chronic kidney disease) stage 3, GFR 30-59 ml/min (HCC)   Acute on chronic systolic CHF (congestive heart failure) (HCC)    Afib with RVR -Patient with known afib presenting with afib with RVR. -He reports having been lost to f/u by cardiology and so has not been taking rate-controlling medications; his last visit with Dr. Stanford Breed appears to have been in 03/2019 -He does report continuing to take Eliquis -He has had waxing and waning SOB for about 3 months; no apparent worsening but he retired and so has more time to be concerned about  his health now -Will plan to place in observation status in SDU for Diltiazem drip as per protocol with plan to transition to PO Diltiazem once heart rate is controlled (resting HR 110 or lower).  -Will request Echocardiogram for further evaluation  -Will risk stratify with FLP and HgbA1c; will also order TSH -Will consult cardiology  -Heart rate is poorly controlled despite max Dilt; cardiology has been reconsulted to assess whether an alternate agent needs to be considered (vs. DCCV). -Continue home Eliquis.  HTN -Transition from Toprol XL to TID Lopressor, per cardiology for rate control -Hold amlodipine, lisinopril -Will also add prn hydralazine  Acute on chronic systolic CHF -Last echo was in 03/2019 and showed preserved EF with inability to evaluate diastolic function due to afib; EF was 30-35% previously, in 10/2016 -He has mild edema and may have some volume overload so diuresis is reasonable; will order Lasix BID -Echo is pending  Stage 3a CKD -Appears to be stable at this time -Will hold lisinopril for now and restart once renal function is confirmed as stable -Repeat BMP in AM     Advance Care Planning:   Code Status: Full Code   Consults: Cardiology  DVT Prophylaxis: Eliquis  Family Communication:  None present; he is capable of communicating with his family at this time  Severity of Illness: It is my clinical opinion that referral for OBSERVATION is reasonable and necessary in this patient based on the above information provided. The aforementioned taken together are felt to place the patient at high risk for further clinical deterioration. However it is anticipated that the patient may be medically stable for discharge from the hospital within 24 to 48 hours.   Author: Jonah Blue, MD 04/02/2022 5:43 PM  For on call review [email or web address].

## 2022-04-02 NOTE — ED Notes (Signed)
Called lab to acquire to the status of the labs that were supposed to be added to previous collection by lab at 1603.

## 2022-04-02 NOTE — ED Provider Notes (Signed)
[facility] EMERGENCY DEPARTMENT Provider Note   CSN: [id number] Arrival date & time: 04/02/22  1047     History  Chief Complaint  Patient presents with   Shortness of Breath    X 3 months   Afib RVR    Javier Berry is a 61 y.o. male.  HPI 61 yo male ho afib, ckd hypertension presents with increasing dyspnea.  Increased dyspnea for 3 months.  Now coughing with dyspnea.  Cough with some clear to green sputum.  No fever, chills, no chest pain.  Patient out of toprol for 6 months.  Patient taking eliquis as prescribed.  Increased edema.     Home Medications Prior to Admission medications   Medication Sig Start Date End Date Taking? Authorizing Provider  acetaminophen (TYLENOL) 325 MG tablet Take 650 mg by mouth every 6 (six) hours as needed for mild pain.   Yes [provider]  allopurinol (ZYLOPRIM) 100 MG tablet Take 100 mg by mouth daily. 12/31/21  Yes [provider]  amLODipine (NORVASC) 5 MG tablet Take 1 tablet (5 mg total) by mouth daily. 04/19/19  Yes Crenshaw, Madolyn Frieze, MD  ELIQUIS 5 MG TABS tablet TAKE 1 TABLET BY MOUTH 2 TIMES DAILY pt needs appointment and labs 08/31/20  Yes Lewayne Bunting, MD  fluticasone furoate-vilanterol (BREO ELLIPTA) 100-25 MCG/ACT AEPB Inhale 1 puff into the lungs daily as needed (shortness of breath).   Yes [provider]  furosemide (LASIX) 40 MG tablet Take 40 mg by mouth daily. 02/17/22  Yes [provider]  ibuprofen (ADVIL) 200 MG tablet Take 200 mg by mouth every 6 (six) hours as needed for mild pain.   Yes [provider]  metoprolol succinate (TOPROL-XL) 25 MG 24 hr tablet Take 25 mg by mouth daily.   Yes [provider]  lisinopril (ZESTRIL) 20 MG tablet Take 1 tablet (20 mg total) by mouth daily. Patient not taking: Reported on 04/02/2022 04/19/19 07/18/19  Lewayne Bunting, MD  metoprolol succinate (TOPROL-XL) 50 MG 24 hr tablet TAKE 1 TABLET BY MOUTH DAILY. PLEASE  SCHEDULE OV FOR FURTHER REFILLS. Patient not taking: Reported on 04/02/2022 12/21/19   Lewayne Bunting, MD  spironolactone (ALDACTONE) 25 MG tablet TAKE 1 TABLET BY MOUTH DAILY. Patient not taking: Reported on 04/02/2022 09/28/19   Abelino Derrick, PA-C      Allergies    Patient has no known allergies.    Review of Systems   Review of Systems  Physical Exam Updated Vital Signs BP (!) 115/102   Pulse (!) 126   Temp 98.6 F (37 C)   Resp 13   Ht 1.829 m (6')   Wt 108.4 kg   SpO2 97%   BMI 32.41 kg/m  Physical Exam Vitals reviewed.  Constitutional:      Appearance: Normal appearance.  HENT:     Head: Normocephalic.     Right Ear: External ear normal.     Left Ear: External ear normal.     Nose: Nose normal.     Mouth/Throat:     Mouth: Mucous membranes are moist.     Pharynx: Oropharynx is clear.  Eyes:     Extraocular Movements: Extraocular movements intact.     Pupils: Pupils are equal, round, and reactive to light.  Cardiovascular:     Rate and Rhythm: Tachycardia present. Rhythm irregular.     Pulses: Normal pulses.  Pulmonary:     Effort: Pulmonary effort is normal.  Breath sounds: Normal breath sounds.  Abdominal:     General: Abdomen is flat. Bowel sounds are normal.  Musculoskeletal:     Cervical back: Normal range of motion.     Right lower leg: Edema present.     Left lower leg: Edema present.  Skin:    General: Skin is warm.     Capillary Refill: Capillary refill takes less than 2 seconds.  Neurological:     General: No focal deficit present.     Mental Status: He is alert.  Psychiatric:        Mood and Affect: Mood normal.     ED Results / Procedures / Treatments   Labs (all labs ordered are listed, but only abnormal results are displayed) Labs Reviewed  BASIC METABOLIC PANEL - Abnormal; Notable for the following components:      Result Value   Glucose, Bld 152 (*)    Creatinine, Ser 1.51 (*)    GFR, Estimated 52 (*)    All other  components within normal limits  PROTIME-INR - Abnormal; Notable for the following components:   Prothrombin Time 15.9 (*)    INR 1.3 (*)    All other components within normal limits  CBC    EKG EKG Interpretation  Date/Time:  Wednesday April 02 2022 10:55:01 EDT Ventricular Rate:  160 PR Interval:    QRS Duration: 92 QT Interval:  296 QTC Calculation: 482 R Axis:   198 Text Interpretation: Atrial fibrillation with rapid ventricular response with premature ventricular or aberrantly conducted complexes Right superior axis deviation Possible Anterior infarct , age undetermined Abnormal ECG When compared with ECG of 06-Nov-2016 06:54, PREVIOUS ECG IS PRESENT Confirmed by Margarita Grizzle [phone]) on 04/02/2022 11:14:28 AM  Radiology DG Chest Port 1 View  Result Date: 04/02/2022 CLINICAL DATA:  Dyspnea, shortness of breath. EXAM: PORTABLE CHEST 1 VIEW COMPARISON:  Chest radiograph dated 11/06/2016. FINDINGS: The heart size is enlarged. Both lungs are clear. The visualized skeletal structures are unremarkable. IMPRESSION: Cardiomegaly.  No active disease. Electronically Signed   By: Romona Curls M.D.   On: 04/02/2022 11:46    Procedures Procedures    Medications Ordered in ED Medications  diltiazem (CARDIZEM) 1 mg/mL load via infusion 10 mg (10 mg Intravenous Bolus from Bag 04/02/22 1154)    And  diltiazem (CARDIZEM) 125 mg in dextrose 5% 125 mL (1 mg/mL) infusion (15 mg/hr Intravenous Infusion Verify 04/02/22 1437)  furosemide (LASIX) injection 40 mg (40 mg Intravenous Given 04/02/22 1148)  diltiazem (CARDIZEM) 1 mg/mL load via infusion 15 mg (15 mg Intravenous Bolus from Bag 04/02/22 1326)    ED Course/ Medical Decision Making/ A&P Clinical Course as of 04/02/22 1545  Wed Apr 02, 2022  1324 CBC reviewed interpreted within normal limits [DR]  1324 Basic metabolic panel(!) Basic metabolic panel reviewed interpreted and hyperglycemia with glucose of 152 [DR]  1324 Creatinine noted to be  elevated at 1.51 and this appears to be stable from prior [DR]  1324 Chest x-Noris Kulinski reviewed interpreted and cardiomegaly noted and radiologist interpretation concurs [DR]    Clinical Course User Index [DR] Margarita Grizzle, MD                           Medical Decision Making 61 yo male ho afib rvr noncompliant with rate control meds presents with afib rvr and dyspnea.   Patient on eliquis and reports plans EKG here shows A-fib with RVR with rate of  approximately 150 Patient started on Cardizem initially 5 and then a second 10 mg bolus given Labs obtained and reviewed with elevated glucose and creatinine however consistent with prior Care discussed with cardiology and will see and evaluate Awaiting cardiology input Discussed with Dr. Particia Nearing who will assume care  Amount and/or Complexity of Data Reviewed Labs: ordered. Decision-making details documented in ED Course. Radiology: ordered and independent interpretation performed. Decision-making details documented in ED Course. ECG/medicine tests: ordered. Decision-making details documented in ED Course.  Risk Prescription drug management.    CRITICAL CARE Performed by: Margarita Grizzle Total critical care time: 60 minutes Critical care time was exclusive of separately billable procedures and treating other patients. Critical care was necessary to treat or prevent imminent or life-threatening deterioration. Critical care was time spent personally by me on the following activities: development of treatment plan with patient and/or surrogate as well as nursing, discussions with consultants, evaluation of patient's response to treatment, examination of patient, obtaining history from patient or surrogate, ordering and performing treatments and interventions, ordering and review of laboratory studies, ordering and review of radiographic studies, pulse oximetry and re-evaluation of patient's condition.  60       Final Clinical Impression(s) /  ED Diagnoses Final diagnoses:  Atrial fibrillation with rapid ventricular response [facility])    Rx / DC Orders ED Discharge Orders     None         Margarita Grizzle, MD 04/02/22 1546

## 2022-04-02 NOTE — ED Notes (Signed)
Cardiologist at bedside at this time.

## 2022-04-02 NOTE — ED Notes (Signed)
Called lab to have BNP, Mag, Hepatic function, A1c and TSH added to previous collection. Per lab staff, they will add those labs onto previous collection

## 2022-04-02 NOTE — Consult Note (Addendum)
Cardiology Consultation:   Patient ID: NYKEEM ABDULLAHI MRN: [id number]; DOB: [birth year]  Admit date: 04/02/2022 Date of Consult: 04/02/2022  PCP:  Javier Berry No   [facility] Providers Cardiologist:  Javier Ruths, MD   {   Patient Profile:   Javier Berry is a 61 y.o. male with a hx of HTN, permanent A fib, non-ischemic cardiomyopathy, chronic systolic heart failure with recovering LVEF 04/26/2019 (from 35% to 55%), CKD III, medication non-compliance, gout, who is being seen 04/02/2022 for the evaluation of A fib RVR at the request of Javier. Jeanell Berry.   History of Present Illness:   Javier Berry follows Javier Berry outpatient for A fib and NICM since 2015. He was originally noted in A fib back in 2015, underwent cardioversion in 12/20/13 with successful conversion to sinus rhythm. Later he had recurrence of A fib, has been maintained on metoprolol for rate control and Eliquis for anticoagulation. Echo from 11/16/13 with LVEF 60-65%, mild to moderate LAE and RAE.   He was admitted in 02/04/15 for CHF, A fib RVR,  and hypertensive urgency. He was off beta blocker at the time due to non-compliance.  Echo from 02/04/15 showed LVEF dropping to 35-40%, mild MR, severe LAE, mild to moderate reduced RV, mild TR. NM stress myovue 02/06/15 was a high risk study but normal. He was placed on Lasix, hydralazine and Imdur, metoprolol XL, spironolactone for GDMT. Later at follow up visit, losartan was added to above regimen.   11/04/16 he was admitted for SBO, complicated by A fib RVRm required amiodarone gtt. Echo repeated 11/04/16 showed LVEF 30-35%, diffuse hypokinesis, mild MR, severe LAE and RAE, moderate TR. Entresto was and digoxin were started for CHF. Later at follow up visit he was weaned off amiodarone, unable take Entresto due to insurance requiring prior authorization, he was remained on metoprolol,lisinopril, lasix, spironolactone for CHF.   07/06/18 he was seen by APP in the office, medical therapy for A fib/NICM/HTN  was streamlined to amlodipine, spironolactone, metoprolol, lisinopril, and eliquis.    He was last seen by Javier Berry in 04/19/19, stable without CHF symptoms. He was maintained on BB and Eliquis for A fib. 24 hours Holter was done and rate was well reasonably controlled. He was on maintained lisinopril, metoprolol, spironolactone for CHF. Echo repeated 04/26/19 showed improved LVEF 55-60%, normal RV, moderate LAE and severe RAE. ICD was planned if EF <35%.   He was lost to follow with cardiology since 2020.   He presented to ER today c/o dyspnea. He had progressively worsening SOB for 3 month. He endorses cough with clear to green sputum, leg edema. He had ran out of metoprolol for 6 months and has been taking Eliquis. He reports feeling strong heart beats and heart racing sensation over the past few months.  He states he had been following a cardiologist at [facility], he was told his A fib was controlled well. He was taking 1 lasix daily but does not dosing. He noted himself with various urine quantity at times despite taking lasix daily. He states he has gained over 20 pounds over the past 1-2 months. His cardiologist at [facility] left and he has not been seen for the past 6-7 months. He said he is however taking all his cardiac meds except metoprolol. His wife had concern that patient had lumps of his lower abdomen in the past. His wife also reports patient had side effect from a medication that they don't know name, causing him  to be depressed with suicide thought. He denied any suicide ideation currently. Wife wants the patient to be checked for diabetes. Patient denied any tobacco use, illicit drug use, or ETOH use.   Admission diagnostic showed Cr 1.51, GFR 52, unremarkable CBC. INR 1.3. CXR without acute findings. EKG with A fib RVR 160s. He was hypertensive with BP 160/133 at ED. He was given cardizem gtt and lasix 40mg  at ED. Cardiology is consulted for further input.          Past Medical History:  Diagnosis Date   Chronic systolic CHF (congestive heart failure) (HCC)    a. 01/2015 Echo: EF 35-40%;  b. 10/2016 Echo: EF 30-35%, diff HK, mild MR, sev dil LA/RA, mod TR, PASP 11/2016.   CKD (chronic kidney disease), stage III (HCC)    Essential hypertension    a. with h/o HTN urgency in the setting of noncompliance.   Gout    NICM (nonischemic cardiomyopathy) (HCC)    a. 01/2015 Echo: EF 35-40%;  b. 01/2015 Myoview: EF 30-44%, no ischemia/infarct;  c. 10/2016 Echo: EF 30-35%, diff HK.   Persistent atrial fibrillation (HCC)    a. CHA2DS2VASc = 2-->Eliquis;  b. s/p failed DCCV in 11/2013;  c. 10/2016 Echo: severe biatrial enlargement.   PUD (peptic ulcer disease)     Past Surgical History:  Procedure Laterality Date   CARDIOVERSION N/A 12/20/2013   Procedure: CARDIOVERSION;  Surgeon: 12/22/2013, MD;  Location: [facility] ENDOSCOPY;  Service: Cardiovascular;  Laterality: N/A;   TOE SURGERY       Home Medications:  Prior to Admission medications   Medication Sig Start Date End Date Taking? Authorizing Provider  acetaminophen (TYLENOL) 325 MG tablet Take 650 mg by mouth every 6 (six) hours as needed for mild pain.   Yes [provider]  allopurinol (ZYLOPRIM) 100 MG tablet Take 100 mg by mouth daily. 12/31/21  Yes [provider]  amLODipine (NORVASC) 5 MG tablet Take 1 tablet (5 mg total) by mouth daily. 04/19/19  Yes Crenshaw, 04/21/19, MD  ELIQUIS 5 MG TABS tablet TAKE 1 TABLET BY MOUTH 2 TIMES DAILY pt needs appointment and labs 08/31/20  Yes 14/3/21, MD  fluticasone furoate-vilanterol (BREO ELLIPTA) 100-25 MCG/ACT AEPB Inhale 1 puff into the lungs daily as needed (shortness of breath).   Yes [provider]  furosemide (LASIX) 40 MG tablet Take 40 mg by mouth daily. 02/17/22  Yes [provider]  ibuprofen (ADVIL) 200 MG tablet Take 200 mg by mouth every 6 (six) hours as needed for mild pain.   Yes [provider]  metoprolol succinate (TOPROL-XL) 25 MG 24 hr tablet Take 25 mg by mouth daily.   Yes [provider]  lisinopril (ZESTRIL) 20 MG tablet Take 1 tablet (20 mg total) by mouth daily. Patient not taking: Reported on 04/02/2022 04/19/19 07/18/19  07/20/19, MD  metoprolol succinate (TOPROL-XL) 50 MG 24 hr tablet TAKE 1 TABLET BY MOUTH DAILY. PLEASE SCHEDULE OV FOR FURTHER REFILLS. Patient not taking: Reported on 04/02/2022 12/21/19   12/23/19, MD  spironolactone (ALDACTONE) 25 MG tablet TAKE 1 TABLET BY MOUTH DAILY. Patient not taking: Reported on 04/02/2022 09/28/19   09/30/19, PA-C    Inpatient Medications: Scheduled Meds:  apixaban  5 mg Oral BID   furosemide  40 mg Intravenous BID   [START ON 04/03/2022] lisinopril  20 mg Oral Daily   metoprolol tartrate  25 mg Oral TID   [  START ON 04/03/2022] spironolactone  25 mg Oral Daily   Continuous Infusions:  diltiazem (CARDIZEM) infusion 15 mg/hr (04/02/22 1437)   PRN Meds:   Allergies:   No Known Allergies  Social History:   Social History   Socioeconomic History   Marital status: Married    Spouse name: Not on file   Number of children: 3   Years of education: Not on file   Highest education level: Not on file  Occupational History    Employer: [employer]  Tobacco Use   Smoking status: Never   Smokeless tobacco: Never  Substance and Sexual Activity   Alcohol use: No    Comment: Occasional   Drug use: No   Sexual activity: Not on file  Other Topics Concern   Not on file  Social History Narrative   Not on file   Social Determinants of Health   Financial Resource Strain: Not on file  Food Insecurity: Not on file  Transportation Needs: Not on file  Physical Activity: Not on file  Stress: Not on file  Social Connections: Not on file  Intimate Partner Violence: Not on file    Family History:    Family History  Problem Relation Age of Onset   Kidney disease Mother     Diabetes Mother    Hypertension Father    Congestive Heart Failure Father    Kidney disease Sister    Stroke Sister    Drug abuse Brother    Heart disease Brother    Heart murmur Brother    Colon cancer Neg Hx      ROS:  Constitutional: Denied fever, chills, malaise, night sweats Eyes: Denied vision change or loss Ears/Nose/Mouth/Throat: Denied ear ache, sore throat, coughing, sinus pain Cardiovascular: denied chest pain/pressure Respiratory: see HPI  Gastrointestinal: Denied nausea, vomiting, abdominal pain, diarrhea Genital/Urinary: Denied dysuria, hematuria, urinary frequency/urgency Musculoskeletal: Denied muscle ache, joint pain, weakness Skin: Denied rash, wound Neuro: Denied headache, dizziness, syncope Psych: history of depression/anxiety  Endocrine: Denied history of diabetes    Physical Exam/Data:   Vitals:   04/02/22 1445 04/02/22 1500 04/02/22 1515 04/02/22 1530  BP: (!) 143/79 (!) 115/102 111/83 (!) 160/147  Pulse:  (!) 126 (!) 109 (!) 130  Resp: 20 13 15  (!) 25  Temp:      SpO2: 97%  98% 99%  Weight:      Height:        Intake/Output Summary (Last 24 hours) at 04/02/2022 1610 Last data filed at 04/02/2022 1437 Gross per 24 hour  Intake 45.07 ml  Output 500 ml  Net -454.93 ml      04/02/2022   11:24 AM 04/19/2019    8:30 AM 07/06/2018   10:59 AM  Last 3 Weights  Weight (lbs) 239 lb 222 lb 220 lb  Weight (kg) 108.41 kg 100.699 kg 99.791 kg     Body mass index is 32.41 kg/m.   Vitals:  Vitals:   04/02/22 1515 04/02/22 1530  BP: 111/83 (!) 160/147  Pulse: (!) 109 (!) 130  Resp: 15 (!) 25  Temp:    SpO2: 98% 99%   General Appearance: In no apparent distress, laying in bed HEENT: Normocephalic, atraumatic.  Neck: Supple, trachea midline, elevated JVD to jaw with HOB at 45 degree  Cardiovascular: Irregularly irregular, normal S1-S2,  no murmur Respiratory: Resting breathing unlabored, lungs sounds with rales bilaterally, no use of accessory  muscles. On room air.  Gastrointestinal: Bowel sounds positive, abdomen soft, non-tender, no  hernia noted  Extremities: Able to move all extremities in bed without difficulty, BLE 2-3+ pitting edema noted GU: Urine clear yellow in urinal  Musculoskeletal: Normal muscle bulk and tone Skin: Intact, warm, dry. No rashes or petechiae noted in exposed areas.  Neurologic: Alert, oriented to person, place and time. Fluent speech, no facial droop, no cognitive deficit Psychiatric: Normal affect. Mood is appropriate.    EKG:  The EKG was personally reviewed and demonstrates:  A fib RVR 160s   Telemetry:  Telemetry was personally reviewed and demonstrates:  A fib with RVR 100-150s, intermittent NSVT 3-5 runs noted   Relevant CV Studies:  Echo from 04/26/19:   1. The left ventricle has normal systolic function, with an ejection  fraction of 55-60%, with beat to beat variability. The cavity size was  normal. Left ventricular diastolic function could not be evaluated  secondary to atrial fibrillation. Mildly  increased left ventricular wall thickness.   2. The right ventricle has normal systolic function. The cavity was  normal. There is no increase in right ventricular wall thickness. Right  ventricular systolic pressure is normal with an estimated pressure of 26.5  mmHg.   3. Left atrial size was moderately dilated.   4. Right atrial size was severely dilated   5. When compared to the prior study: 11/04/16 - the ejection fraction has  improved, and heart rate has decreased. Side by side comparison of images  performed.    Echo from 11/04/2016:  - Left ventricle: The cavity size was normal. Wall thickness was    increased in a pattern of mild LVH. Systolic function was    moderately to severely reduced. The estimated ejection fraction    was in the range of 30% to 35%. Diffuse hypokinesis. The study is    not technically sufficient to allow evaluation of LV diastolic    function.  - Mitral  valve: There was mild regurgitation.  - Left atrium: The atrium was severely dilated.  - Right atrium: The atrium was severely dilated.  - Tricuspid valve: There was moderate regurgitation.  - Pulmonary arteries: Systolic pressure was mildly increased. PA    peak pressure: 41 mm Hg (S).  - Pericardium, extracardiac: A trivial pericardial effusion was    identified posterior to the heart.   Myoview Study Impression 02/06/2015:  The study is normal. This is a high risk study. LV cavity size is mildly enlarged. The left ventricular ejection fraction is moderately decreased (30-44%). There is no prior study for comparison.Global Hypokinesis  Laboratory Data:  High Sensitivity Troponin:  No results for input(s): "TROPONINIHS" in the last 720 hours.   Chemistry Recent Labs  Lab 04/02/22 1116  NA 142  K 3.7  CL 111  CO2 22  GLUCOSE 152*  BUN 16  CREATININE 1.51*  CALCIUM 8.9  GFRNONAA 52*  ANIONGAP 9    No results for input(s): "PROT", "ALBUMIN", "AST", "ALT", "ALKPHOS", "BILITOT" in the last 168 hours. Lipids No results for input(s): "CHOL", "TRIG", "HDL", "LABVLDL", "LDLCALC", "CHOLHDL" in the last 168 hours.  Hematology Recent Labs  Lab 04/02/22 1116  WBC 5.7  RBC 4.29  HGB 14.1  HCT 41.2  MCV 96.0  MCH 32.9  MCHC 34.2  RDW 15.3  PLT 167   Thyroid No results for input(s): "TSH", "FREET4" in the last 168 hours.  BNPNo results for input(s): "BNP", "PROBNP" in the last 168 hours.  DDimer No results for input(s): "DDIMER" in the last 168 hours.   Radiology/Studies:  DG Chest Port 1 View  Result Date: 04/02/2022 CLINICAL DATA:  Dyspnea, shortness of breath. EXAM: PORTABLE CHEST 1 VIEW COMPARISON:  Chest radiograph dated 11/06/2016. FINDINGS: The heart size is enlarged. Both lungs are clear. The visualized skeletal structures are unremarkable. IMPRESSION: Cardiomegaly.  No active disease. Electronically Signed   By: Zerita Boers M.D.   On: 04/02/2022 11:46      Assessment and Plan:   Permanent A fib with RVR NSVT - originally diagnosed with A fib 2015, failed DCCV 11/2013, has been on metoprolol and Eliquis historically  - stopped metoprolol 6 month ago due to running out of refill per patient - will resume PO metoprolol 25mg  TID, wean off cardizem gtt  - will check TSH, Mag, and Echo  - keep Mag >2 and K >4   Acute systolic heart failure  Non-ischemic cardiomyopathy  - presented with worsening dyspnea, leg edema x3 months - NM stress negative 5/ 2016, LVEF down to 30-35% 10/2016 and improved to 55-60% 03/2019  - CXR without acute finding - will check BNP, LFTs, and Echo - clinically hypervolemic, will start IV Lasix 40mg  BID  - monitor intake and output, daily weight, daily electrolytes  - GDMT:  will start metoprolol 25mg  TID with plan to transition to XL at discharge, resume home meds lisinopril 20mg , and spironolactone  25mg  daily; further adjustment pending Echo   HTN - BP is elevated at ED up to 160/133 - will start metoprolol 25mg  TID, resume lisinopril 20mg , and spironolactone  25mg  daily, resume amlodipine once off cardizem gtt  - trend BP  CKD III - labs from 2019 with Cr in 1.3-1.6 ranges, no recent labs - Cr 1.51 POA - likely from HTN  - will screen for DM with A1C per patient wish  - will monitor renal index with diuresis - avoid nephrotoxin    Abdominal lump in the past  Depression Gout - per internal medicine     Risk Assessment/Risk Scores:   New York Heart Association (NYHA) Functional Class NYHA Class II  CHA2DS2-VASc Score = 2  This indicates a 2.2% annual risk of stroke. The patient's score is based upon: CHF History: 1 HTN History: 1 Diabetes History: 0 Stroke History: 0 Vascular Disease History: 0 Age Score: 0 Gender Score: 0     For questions or updates, please contact [place] Please consult [email or web address] for contact info under    Signed, Margie Billet, NP  04/02/2022 4:10 PM

## 2022-04-02 NOTE — ED Triage Notes (Signed)
Pt arrived to ED via POV w/ c/o shob x 3 months. Pt reports he has been out of his metoprolol for 6 months and his cardiologist left and he hasn't found a new one yet. Pt is still taking his blood thinners. Pt noted to be A-fib RVR on the monitor w/ a hx of such. Pt reports he came in today d/t having a cough w/ the shob. Pt reports sputum goes between grey and clear, denies fever, chills. Pt also c/o BLE swelling. Reports his "fluid pills" aren't pulling off the fluid like it was.

## 2022-04-02 NOTE — ED Notes (Signed)
Per [employer] in the lab, she is going to add pt labs to previous collection at this time

## 2022-04-02 NOTE — ED Provider Notes (Signed)
Pt signed out by Dr. Rosalia Hammers pending cards consult.  Cards will consult on patient, but request that medicine admit.  Pt d/w Dr. Ophelia Charter for admission.   Jacalyn Lefevre, MD 04/02/22 [place] Moment

## 2022-04-03 ENCOUNTER — Observation Stay (HOSPITAL_COMMUNITY): Payer: BC Managed Care – PPO

## 2022-04-03 DIAGNOSIS — Z841 Family history of disorders of kidney and ureter: Secondary | ICD-10-CM | POA: Diagnosis not present

## 2022-04-03 DIAGNOSIS — I5022 Chronic systolic (congestive) heart failure: Secondary | ICD-10-CM

## 2022-04-03 DIAGNOSIS — I5021 Acute systolic (congestive) heart failure: Secondary | ICD-10-CM | POA: Diagnosis not present

## 2022-04-03 DIAGNOSIS — I4891 Unspecified atrial fibrillation: Secondary | ICD-10-CM | POA: Diagnosis not present

## 2022-04-03 DIAGNOSIS — Z91148 Patient's other noncompliance with medication regimen for other reason: Secondary | ICD-10-CM | POA: Diagnosis not present

## 2022-04-03 DIAGNOSIS — Z7951 Long term (current) use of inhaled steroids: Secondary | ICD-10-CM | POA: Diagnosis not present

## 2022-04-03 DIAGNOSIS — Z833 Family history of diabetes mellitus: Secondary | ICD-10-CM | POA: Diagnosis not present

## 2022-04-03 DIAGNOSIS — I509 Heart failure, unspecified: Secondary | ICD-10-CM | POA: Diagnosis present

## 2022-04-03 DIAGNOSIS — E669 Obesity, unspecified: Secondary | ICD-10-CM | POA: Diagnosis present

## 2022-04-03 DIAGNOSIS — M109 Gout, unspecified: Secondary | ICD-10-CM | POA: Diagnosis present

## 2022-04-03 DIAGNOSIS — I472 Ventricular tachycardia, unspecified: Secondary | ICD-10-CM | POA: Diagnosis present

## 2022-04-03 DIAGNOSIS — Z6832 Body mass index (BMI) 32.0-32.9, adult: Secondary | ICD-10-CM | POA: Diagnosis not present

## 2022-04-03 DIAGNOSIS — N1831 Chronic kidney disease, stage 3a: Secondary | ICD-10-CM

## 2022-04-03 DIAGNOSIS — I4821 Permanent atrial fibrillation: Secondary | ICD-10-CM | POA: Diagnosis present

## 2022-04-03 DIAGNOSIS — Z8249 Family history of ischemic heart disease and other diseases of the circulatory system: Secondary | ICD-10-CM | POA: Diagnosis not present

## 2022-04-03 DIAGNOSIS — I5023 Acute on chronic systolic (congestive) heart failure: Secondary | ICD-10-CM | POA: Diagnosis present

## 2022-04-03 DIAGNOSIS — I428 Other cardiomyopathies: Secondary | ICD-10-CM | POA: Diagnosis present

## 2022-04-03 DIAGNOSIS — Z79899 Other long term (current) drug therapy: Secondary | ICD-10-CM | POA: Diagnosis not present

## 2022-04-03 DIAGNOSIS — I10 Essential (primary) hypertension: Secondary | ICD-10-CM

## 2022-04-03 DIAGNOSIS — I130 Hypertensive heart and chronic kidney disease with heart failure and stage 1 through stage 4 chronic kidney disease, or unspecified chronic kidney disease: Secondary | ICD-10-CM | POA: Diagnosis present

## 2022-04-03 DIAGNOSIS — F32A Depression, unspecified: Secondary | ICD-10-CM | POA: Diagnosis present

## 2022-04-03 DIAGNOSIS — Z7901 Long term (current) use of anticoagulants: Secondary | ICD-10-CM | POA: Diagnosis not present

## 2022-04-03 DIAGNOSIS — R739 Hyperglycemia, unspecified: Secondary | ICD-10-CM | POA: Diagnosis present

## 2022-04-03 DIAGNOSIS — E876 Hypokalemia: Secondary | ICD-10-CM | POA: Diagnosis present

## 2022-04-03 LAB — ECHOCARDIOGRAM COMPLETE
Calc EF: 38.7 %
Height: 72 in
MV M vel: 3.92 m/s
MV Peak grad: 61.5 mmHg
Radius: 0.3 cm
S' Lateral: 4.1 cm
Single Plane A2C EF: 40.5 %
Single Plane A4C EF: 41.2 %
Weight: 3824 oz

## 2022-04-03 LAB — CBC
HCT: 38 % — ABNORMAL LOW (ref 39.0–52.0)
Hemoglobin: 12.8 g/dL — ABNORMAL LOW (ref 13.0–17.0)
MCH: 32.2 pg (ref 26.0–34.0)
MCHC: 33.7 g/dL (ref 30.0–36.0)
MCV: 95.7 fL (ref 80.0–100.0)
Platelets: 161 10*3/uL (ref 150–400)
RBC: 3.97 MIL/uL — ABNORMAL LOW (ref 4.22–5.81)
RDW: 15.1 % (ref 11.5–15.5)
WBC: 5.6 10*3/uL (ref 4.0–10.5)
nRBC: 0 % (ref 0.0–0.2)

## 2022-04-03 LAB — BASIC METABOLIC PANEL
Anion gap: 13 (ref 5–15)
BUN: 14 mg/dL (ref 8–23)
CO2: 26 mmol/L (ref 22–32)
Calcium: 9.1 mg/dL (ref 8.9–10.3)
Chloride: 107 mmol/L (ref 98–111)
Creatinine, Ser: 1.47 mg/dL — ABNORMAL HIGH (ref 0.61–1.24)
GFR, Estimated: 54 mL/min — ABNORMAL LOW (ref >60)
Glucose, Bld: 98 mg/dL (ref 70–99)
Potassium: 3.6 mmol/L (ref 3.5–5.1)
Sodium: 146 mmol/L — ABNORMAL HIGH (ref 135–145)

## 2022-04-03 MED ORDER — DIGOXIN 125 MCG PO TABS
0.1250 mg | ORAL_TABLET | Freq: Every day | ORAL | Status: DC
  Administered 2022-04-03 – 2022-04-06 (×4): 0.125 mg via ORAL
  Filled 2022-04-03 (×4): qty 1

## 2022-04-03 NOTE — Plan of Care (Signed)
  Problem: Cardiac: Goal: Ability to achieve and maintain adequate cardiopulmonary perfusion will improve Outcome: Progressing   Problem: Health Behavior/Discharge Planning: Goal: Ability to safely manage health-related needs after discharge will improve Outcome: Progressing   Problem: Education: Goal: Knowledge of General Education information will improve Description: Including pain rating scale, medication(s)/side effects and non-pharmacologic comfort measures Outcome: Progressing   Problem: Clinical Measurements: Goal: Respiratory complications will improve Outcome: Progressing   Problem: Clinical Measurements: Goal: Cardiovascular complication will be avoided Outcome: Progressing   Problem: Safety: Goal: Ability to remain free from injury will improve Outcome: Progressing

## 2022-04-03 NOTE — Progress Notes (Signed)
Progress Note  Patient Name: Javier Berry Date of Encounter: 04/03/2022  [facility] Cardiologist: [name], MD   Subjective   Feels much better. Less SOB   Inpatient Medications    Scheduled Meds:  allopurinol  100 mg Oral Daily   apixaban  5 mg Oral BID   furosemide  40 mg Intravenous BID   metoprolol tartrate  50 mg Oral TID   sodium chloride flush  3 mL Intravenous Q12H   Continuous Infusions:  diltiazem (CARDIZEM) infusion 15 mg/hr (04/03/22 0726)   PRN Meds: acetaminophen **OR** acetaminophen, hydrALAZINE, ondansetron **OR** ondansetron (ZOFRAN) IV   Vital Signs    Vitals:   04/03/22 0600 04/03/22 0630 04/03/22 0700 04/03/22 0730  BP: 113/80 114/67 107/75 (!) 93/55  Pulse: 89 (!) 157 95 (!) 122  Resp: 19 (!) 25 (!) 21 18  Temp:      SpO2: 94% 93% 98% 97%  Weight:      Height:        Intake/Output Summary (Last 24 hours) at 04/03/2022 0830 Last data filed at 04/03/2022 0726 Gross per 24 hour  Intake 293.56 ml  Output 2450 ml  Net -2156.44 ml      04/02/2022   11:24 AM 04/19/2019    8:30 AM 07/06/2018   10:59 AM  Last 3 Weights  Weight (lbs) 239 lb 222 lb 220 lb  Weight (kg) 108.41 kg 100.699 kg 99.791 kg      Telemetry    Afib rate 90-120s - Personally Reviewed  ECG    None today - Personally Reviewed  Physical Exam   GEN: No acute distress.   Neck: + JVD Cardiac: IRRR, no murmurs, rubs, or gallops.  Respiratory: Clear to auscultation bilaterally. GI: Soft, nontender, non-distended  MS: 2+ edema; No deformity. Neuro:  Nonfocal  Psych: Normal affect   Labs    High Sensitivity Troponin:  No results for input(s): "TROPONINIHS" in the last 720 hours.   Chemistry Recent Labs  Lab 04/02/22 1116 04/02/22 1149 04/03/22 0501  NA 142  --  146*  K 3.7  --  3.6  CL 111  --  107  CO2 22  --  26  GLUCOSE 152*  --  98  BUN 16  --  14  CREATININE 1.51*  --  1.47*  CALCIUM 8.9  --  9.1  MG  --  1.9  --   PROT  --  6.5  --    ALBUMIN  --  3.0*  --   AST  --  28  --   ALT  --  16  --   ALKPHOS  --  57  --   BILITOT  --  1.9*  --   GFRNONAA 52*  --  54*  ANIONGAP 9  --  13    Lipids  Recent Labs  Lab 04/02/22 2152  CHOL 121  TRIG 71  HDL 24*  LDLCALC 83  CHOLHDL 5.0    Hematology Recent Labs  Lab 04/02/22 1116 04/03/22 0501  WBC 5.7 5.6  RBC 4.29 3.97*  HGB 14.1 12.8*  HCT 41.2 38.0*  MCV 96.0 95.7  MCH 32.9 32.2  MCHC 34.2 33.7  RDW 15.3 15.1  PLT 167 161   Thyroid  Recent Labs  Lab 04/02/22 2152  TSH 1.733    BNP Recent Labs  Lab 04/02/22 1149  BNP 444.1*    DDimer No results for input(s): "DDIMER" in the last 168 hours.   Radiology  DG Chest Port 1 View  Result Date: 04/02/2022 CLINICAL DATA:  Dyspnea, shortness of breath. EXAM: PORTABLE CHEST 1 VIEW COMPARISON:  Chest radiograph dated 11/06/2016. FINDINGS: The heart size is enlarged. Both lungs are clear. The visualized skeletal structures are unremarkable. IMPRESSION: Cardiomegaly.  No active disease. Electronically Signed   By: Romona Curls M.D.   On: 04/02/2022 11:46    Cardiac Studies   Echo from 04/26/19:   1. The left ventricle has normal systolic function, with an ejection  fraction of 55-60%, with beat to beat variability. The cavity size was  normal. Left ventricular diastolic function could not be evaluated  secondary to atrial fibrillation. Mildly  increased left ventricular wall thickness.   2. The right ventricle has normal systolic function. The cavity was  normal. There is no increase in right ventricular wall thickness. Right  ventricular systolic pressure is normal with an estimated pressure of 26.5  mmHg.   3. Left atrial size was moderately dilated.   4. Right atrial size was severely dilated   5. When compared to the prior study: 11/04/16 - the ejection fraction has  improved, and heart rate has decreased. Side by side comparison of images  performed.      Echo from 11/04/2016:   - Left  ventricle: The cavity size was normal. Wall thickness was    increased in a pattern of mild LVH. Systolic function was    moderately to severely reduced. The estimated ejection fraction    was in the range of 30% to 35%. Diffuse hypokinesis. The study is    not technically sufficient to allow evaluation of LV diastolic    function.  - Mitral valve: There was mild regurgitation.  - Left atrium: The atrium was severely dilated.  - Right atrium: The atrium was severely dilated.  - Tricuspid valve: There was moderate regurgitation.  - Pulmonary arteries: Systolic pressure was mildly increased. PA    peak pressure: 41 mm Hg (S).  - Pericardium, extracardiac: A trivial pericardial effusion was    identified posterior to the heart.    Myoview Study Impression 02/06/2015:  The study is normal. This is a high risk study. LV cavity size is mildly enlarged. The left ventricular ejection fraction is moderately decreased (30-44%). There is no prior study for comparison.Global Hypokinesis  Patient Profile     61 y.o. male with a hx of HTN, permanent A fib, non-ischemic cardiomyopathy, chronic systolic heart failure with recovering LVEF 04/26/2019 (from 35% to 55%), CKD III, medication non-compliance, gout, who is being seen 04/02/2022 for the evaluation of A fib RVR at the request of Dr. Rosalia Hammers.     Assessment & Plan    AFib permanent with RVR due to medication noncompliance. HR improved. On diltiazem IV and Metoprolol. Will add Dig today. Will await results of Echo. Hopefully can transition to oral beta blocker only once improved. Acute on chronic combined systolic/diastolic CHF. Good diuresis with IV lasix negative 2.4 liters. Creatinine stable. HTN. BP low now with multiple meds.      For questions or updates, please contact [facility] Please consult [email or web address] for contact info under        Signed, Raymar Joiner [place], MD  04/03/2022, 8:30 AM

## 2022-04-03 NOTE — Progress Notes (Signed)
PROGRESS NOTE        PATIENT DETAILS Name: Javier Berry Age: 61 y.o. Sex: male Date of Birth: May 12, 1961 Admit Date: 04/02/2022 Admitting Physician Jonah Blue, MD PCP:Pcp, No  Brief Summary: Patient is a 61 y.o.  male chronic HFpEF, CKD stage IIIa, HTN, persistent atrial fibrillation-who presented with A-fib RVR and HFrEF exacerbation.   Significant events: 7/5>> admit to [facility]-A-fib RVR and HFrEF exacerbation.  Significant studies: 7/5>> CXR: No PNA.  Significant microbiology data: None  Procedures: None  Consults: Cardiology  Subjective: Feels better-lying flat-still with significant lower extremity edema.  Objective: Vitals: Blood pressure (!) 93/55, pulse (!) 122, temperature 98.6 F (37 C), resp. rate 18, height 6' (1.829 m), weight 108.4 kg, SpO2 97 %.   Exam: Gen Exam:Alert awake-not in any distress HEENT:atraumatic, normocephalic Chest: B/L clear to auscultation anteriorly CVS:S1S2 irregular, tachycardic Abdomen:soft non tender, non distended Extremities: 3+ edema Neurology: Non focal Skin: no rash  Pertinent Labs/Radiology:    Latest Ref Rng & Units 04/03/2022    5:01 AM 04/02/2022   11:16 AM 07/06/2018   11:52 AM  CBC  WBC 4.0 - 10.5 K/uL 5.6  5.7  5.2   Hemoglobin 13.0 - 17.0 g/dL 73.5  32.9  92.4   Hematocrit 39.0 - 52.0 % 38.0  41.2  43.4   Platelets 150 - 400 K/uL 161  167  163     Lab Results  Component Value Date   NA 146 (H) 04/03/2022   K 3.6 04/03/2022   CL 107 04/03/2022   CO2 26 04/03/2022      Assessment/Plan: Acute on chronic HFrEF: Still volume overloaded-continue IV Lasix-follow electrolytes/intake/output.  Await updated echocardiogram.  Persistent A-fib with RVR: Heart rate better-remains on IV Cardizem, oral metoprolol-digoxin being added by cardiology today.  Continue Eliquis.  HTN: BP relatively stable-continue diltiazem/Coreg/diuretics-follow.  CKD stage IIIa: Creatinine close to  baseline-follow/watch closely.  Gout: No evidence of flare-continue allopurinol.  Noncompliance to medications/follow-up: We will counsel-unfortunately lost to follow-up to cardiology since 2020  Obesity: Estimated body mass index is 32.41 kg/m as calculated from the following:   Height as of this encounter: 6' (1.829 m).   Weight as of this encounter: 108.4 kg.   Code status:   Code Status: Full Code   DVT Prophylaxis: apixaban (ELIQUIS) tablet 5 mg     Family Communication: None at bedside   Disposition Plan: Status is: Observation The patient will require care spanning > 2 midnights and should be moved to inpatient because: Improving volume status-still in RVR-on IV Cardizem-on IV diuretics-not yet stable for discharge.   Planned Discharge Destination:Home in the next 8 hours.   Diet: Diet Order             Diet Heart Room service appropriate? Yes; Fluid consistency: Thin  Diet effective now                     Antimicrobial agents: Anti-infectives (From admission, onward)    None        MEDICATIONS: Scheduled Meds:  allopurinol  100 mg Oral Daily   apixaban  5 mg Oral BID   digoxin  0.125 mg Oral Daily   furosemide  40 mg Intravenous BID   metoprolol tartrate  50 mg Oral TID   sodium chloride flush  3 mL Intravenous Q12H   Continuous  Infusions:  diltiazem (CARDIZEM) infusion 15 mg/hr (04/03/22 0726)   PRN Meds:.acetaminophen **OR** acetaminophen, hydrALAZINE, ondansetron **OR** ondansetron (ZOFRAN) IV   I have personally reviewed following labs and imaging studies  LABORATORY DATA: CBC: Recent Labs  Lab 04/02/22 1116 04/03/22 0501  WBC 5.7 5.6  HGB 14.1 12.8*  HCT 41.2 38.0*  MCV 96.0 95.7  PLT 167 161    Basic Metabolic Panel: Recent Labs  Lab 04/02/22 1116 04/02/22 1149 04/03/22 0501  NA 142  --  146*  K 3.7  --  3.6  CL 111  --  107  CO2 22  --  26  GLUCOSE 152*  --  98  BUN 16  --  14  CREATININE 1.51*  --  1.47*   CALCIUM 8.9  --  9.1  MG  --  1.9  --     GFR: Estimated Creatinine Clearance: 66.3 mL/min (A) (by C-G formula based on SCr of 1.47 mg/dL (H)).  Liver Function Tests: Recent Labs  Lab 04/02/22 1149  AST 28  ALT 16  ALKPHOS 57  BILITOT 1.9*  PROT 6.5  ALBUMIN 3.0*   No results for input(s): "LIPASE", "AMYLASE" in the last 168 hours. No results for input(s): "AMMONIA" in the last 168 hours.  Coagulation Profile: Recent Labs  Lab 04/02/22 1116  INR 1.3*    Cardiac Enzymes: No results for input(s): "CKTOTAL", "CKMB", "CKMBINDEX", "TROPONINI" in the last 168 hours.  BNP (last 3 results) No results for input(s): "PROBNP" in the last 8760 hours.  Lipid Profile: Recent Labs    04/02/22 2152  CHOL 121  HDL 24*  LDLCALC 83  TRIG 71  CHOLHDL 5.0    Thyroid Function Tests: Recent Labs    04/02/22 2152  TSH 1.733    Anemia Panel: No results for input(s): "VITAMINB12", "FOLATE", "FERRITIN", "TIBC", "IRON", "RETICCTPCT" in the last 72 hours.  Urine analysis:    Component Value Date/Time   COLORURINE YELLOW 10/31/2016 2040   APPEARANCEUR CLEAR 10/31/2016 2040   LABSPEC 1.021 10/31/2016 2040   PHURINE 5.0 10/31/2016 2040   GLUCOSEU NEGATIVE 10/31/2016 2040   HGBUR MODERATE (A) 10/31/2016 2040   BILIRUBINUR NEGATIVE 10/31/2016 2040   KETONESUR NEGATIVE 10/31/2016 2040   PROTEINUR 30 (A) 10/31/2016 2040   NITRITE NEGATIVE 10/31/2016 2040   LEUKOCYTESUR NEGATIVE 10/31/2016 2040    Sepsis Labs: Lactic Acid, Venous No results found for: "LATICACIDVEN"  MICROBIOLOGY: No results found for this or any previous visit (from the past 240 hour(s)).  RADIOLOGY STUDIES/RESULTS: DG Chest Port 1 View  Result Date: 04/02/2022 CLINICAL DATA:  Dyspnea, shortness of breath. EXAM: PORTABLE CHEST 1 VIEW COMPARISON:  Chest radiograph dated 11/06/2016. FINDINGS: The heart size is enlarged. Both lungs are clear. The visualized skeletal structures are unremarkable. IMPRESSION:  Cardiomegaly.  No active disease. Electronically Signed   By: Romona Curls M.D.   On: 04/02/2022 11:46     LOS: 0 days   Jeoffrey Massed, MD  [facility]    To contact the attending provider between 7A-7P or the covering provider during after hours 7P-7A, please log into the web site [email or web address] and access using universal [place] password for that web site. If you do not have the password, please call the hospital operator.  04/03/2022, 9:07 AM

## 2022-04-03 NOTE — Progress Notes (Signed)
  Transition of Care [facility]) Screening Note   Patient Details  Name: Javier Berry Date of Birth: 26-Feb-1961   [facility]) CM/SW Contact:    Delilah Shan, LCSWA Phone Number: 04/03/2022, 2:52 PM    Transition of Care Department [facility]) has reviewed patient and no TOC needs have been identified at this time. We will continue to monitor patient advancement through interdisciplinary progression rounds. If new patient transition needs arise, please place a TOC consult.

## 2022-04-03 NOTE — Progress Notes (Signed)
Patient arrived from ED in AFIB RVR    04/03/22 1356  Assess: MEWS Score  Temp 98.3 F (36.8 C)  BP 108/90  MAP (mmHg) 99  Pulse Rate 91  ECG Heart Rate (!) 128  Level of Consciousness Alert  SpO2 100 %  O2 Device Room Air  Assess: MEWS Score  MEWS Temp 0  MEWS Systolic 0  MEWS Pulse 2  MEWS RR 1  MEWS LOC 0  MEWS Score 3  MEWS Score Color Yellow  Assess: if the MEWS score is Yellow or Red  Were vital signs taken at a resting state? Yes  Focused Assessment No change from prior assessment  Does the patient meet 2 or more of the SIRS criteria? No  MEWS guidelines implemented *See Row Information* Yes  Treat  MEWS Interventions Administered scheduled meds/treatments  Pain Scale 0-10  Pain Score 0  Take Vital Signs  Increase Vital Sign Frequency  Yellow: Q 2hr X 2 then Q 4hr X 2, if remains yellow, continue Q 4hrs  Escalate  MEWS: Escalate Yellow: discuss with charge nurse/RN and consider discussing with provider and RRT  Notify: Charge Nurse/RN  Name of Charge Nurse/RN Notified Brooke RN  Date Charge Nurse/RN Notified 04/03/22  Time Charge Nurse/RN Notified 1356  Document  Patient Outcome Other (Comment)  Assess: SIRS CRITERIA  SIRS Temperature  0  SIRS Pulse 1  SIRS Respirations  1  SIRS WBC 0  SIRS Score Sum  2

## 2022-04-03 NOTE — Progress Notes (Signed)
  Echocardiogram 2D Echocardiogram has been performed.  [name] 04/03/2022, 10:39 AM

## 2022-04-03 NOTE — Progress Notes (Addendum)
Patient HR continues to have rate 100s-130s, some episodes in 140s on max cardizem drip 15mg /hr. Cardiology PA on call notified  6:51 PM Spoke to PA on call and will continue with cardizem at 15mg /hr to help keep patients BP from dropping too low.

## 2022-04-04 DIAGNOSIS — I4891 Unspecified atrial fibrillation: Secondary | ICD-10-CM | POA: Diagnosis not present

## 2022-04-04 DIAGNOSIS — I5023 Acute on chronic systolic (congestive) heart failure: Secondary | ICD-10-CM | POA: Diagnosis not present

## 2022-04-04 LAB — BASIC METABOLIC PANEL
Anion gap: 9 (ref 5–15)
BUN: 14 mg/dL (ref 8–23)
CO2: 25 mmol/L (ref 22–32)
Calcium: 8.7 mg/dL — ABNORMAL LOW (ref 8.9–10.3)
Chloride: 107 mmol/L (ref 98–111)
Creatinine, Ser: 1.54 mg/dL — ABNORMAL HIGH (ref 0.61–1.24)
GFR, Estimated: 51 mL/min — ABNORMAL LOW (ref >60)
Glucose, Bld: 119 mg/dL — ABNORMAL HIGH (ref 70–99)
Potassium: 3.4 mmol/L — ABNORMAL LOW (ref 3.5–5.1)
Sodium: 141 mmol/L (ref 135–145)

## 2022-04-04 LAB — MAGNESIUM: Magnesium: 1.8 mg/dL (ref 1.7–2.4)

## 2022-04-04 MED ORDER — AMIODARONE LOAD VIA INFUSION
150.0000 mg | Freq: Once | INTRAVENOUS | Status: AC
  Administered 2022-04-04: 150 mg via INTRAVENOUS
  Filled 2022-04-04: qty 83.34

## 2022-04-04 MED ORDER — MAGNESIUM SULFATE 2 GM/50ML IV SOLN
2.0000 g | Freq: Once | INTRAVENOUS | Status: AC
  Administered 2022-04-04: 2 g via INTRAVENOUS
  Filled 2022-04-04: qty 50

## 2022-04-04 MED ORDER — AMIODARONE HCL IN DEXTROSE 360-4.14 MG/200ML-% IV SOLN
30.0000 mg/h | INTRAVENOUS | Status: DC
  Administered 2022-04-04 – 2022-04-05 (×2): 30 mg/h via INTRAVENOUS
  Filled 2022-04-04: qty 200

## 2022-04-04 MED ORDER — METOPROLOL TARTRATE 50 MG PO TABS
75.0000 mg | ORAL_TABLET | Freq: Three times a day (TID) | ORAL | Status: DC
  Administered 2022-04-04 – 2022-04-06 (×6): 75 mg via ORAL
  Filled 2022-04-04 (×7): qty 1

## 2022-04-04 MED ORDER — AMIODARONE HCL IN DEXTROSE 360-4.14 MG/200ML-% IV SOLN
60.0000 mg/h | INTRAVENOUS | Status: AC
  Administered 2022-04-04 (×2): 60 mg/h via INTRAVENOUS
  Filled 2022-04-04 (×2): qty 200

## 2022-04-04 MED ORDER — POTASSIUM CHLORIDE CRYS ER 20 MEQ PO TBCR
40.0000 meq | EXTENDED_RELEASE_TABLET | Freq: Once | ORAL | Status: AC
  Administered 2022-04-04: 40 meq via ORAL
  Filled 2022-04-04: qty 2

## 2022-04-04 NOTE — Progress Notes (Signed)
Mobility Specialist Progress Note    04/04/22 1538  Mobility  Activity Ambulated independently in hallway  Level of Assistance Independent after set-up  Assistive Device None  Distance Ambulated (ft) 400 ft  Activity Response Tolerated well  $Mobility charge 1 Mobility   Pre-Mobility: 99 HR, 99% SpO2 During Mobility: 131 HR Post-Mobility: 94 HR  Pt received in bed and agreeable. No complaints on walk. Returned to sitting EOB with call bell in reach.    Javier Berry Mobility Specialist

## 2022-04-04 NOTE — Plan of Care (Signed)
  Problem: Education: Goal: Knowledge of disease or condition will improve Outcome: Progressing   Problem: Cardiac: Goal: Ability to achieve and maintain adequate cardiopulmonary perfusion will improve Outcome: Progressing   Problem: Health Behavior/Discharge Planning: Goal: Ability to safely manage health-related needs after discharge will improve Outcome: Progressing   

## 2022-04-04 NOTE — Progress Notes (Signed)
PROGRESS NOTE        PATIENT DETAILS Name: Javier Berry Age: 61 y.o. Sex: male Date of Birth: 1961/06/20 Admit Date: 04/02/2022 Admitting Physician Dewayne Shorter Levora Dredge, MD PCP:Pcp, No  Brief Summary: Patient is a 60 y.o.  male chronic HFpEF, CKD stage IIIa, HTN, persistent atrial fibrillation-who presented with A-fib RVR and HFrEF exacerbation.  Significant events: 7/5>> admit to [facility]-A-fib RVR and HFrEF exacerbation.  Significant studies: 7/5>> CXR: No PNA. 7/6>> Echo: EF 30-35%, RV systolic function mildly reduced.   Significant microbiology data: None  Procedures: None  Consults: Cardiology  Subjective: Feels better-heart rate still in the low 100s-less swelling in his legs.  Objective: Vitals: Blood pressure 106/84, pulse 93, temperature 98 F (36.7 C), temperature source Oral, resp. rate 16, height 6' (1.829 m), weight 106.1 kg, SpO2 98 %.   Exam: Gen Exam:Alert awake-not in any distress HEENT:atraumatic, normocephalic Chest: B/L clear to auscultation anteriorly CVS:S1S2 regular Abdomen:soft non tender, non distended Extremities:2+ edema Neurology: Non focal Skin: no rash   Pertinent Labs/Radiology:    Latest Ref Rng & Units 04/03/2022    5:01 AM 04/02/2022   11:16 AM 07/06/2018   11:52 AM  CBC  WBC 4.0 - 10.5 K/uL 5.6  5.7  5.2   Hemoglobin 13.0 - 17.0 g/dL 41.6  60.6  30.1   Hematocrit 39.0 - 52.0 % 38.0  41.2  43.4   Platelets 150 - 400 K/uL 161  167  163     Lab Results  Component Value Date   NA 141 04/04/2022   K 3.4 (L) 04/04/2022   CL 107 04/04/2022   CO2 25 04/04/2022      Assessment/Plan: Acute on chronic HFrEF: Improved but still volume overloaded-on IV Lasix.  Continue to follow electrolytes/intake/output   Persistent A-fib with RVR: Heart rate better-remains on IV Cardizem-oral beta-blocker and digoxin.  Continues to be on Eliquis.  Await further recommendations from cardiology.  HTN: BP relatively  stable-continue diltiazem/beta-blocker/diuretics  CKD stage IIIa: Creatinine close to baseline-follow/watch closely.  Hypokalemia: Replete and recheck.  Gout: No evidence of flare-continue allopurinol.  Noncompliance to medications/follow-up: unfortunately lost to follow-up to cardiology since 2020-has been counseled.  Obesity: Estimated body mass index is 31.74 kg/m as calculated from the following:   Height as of this encounter: 6' (1.829 m).   Weight as of this encounter: 106.1 kg.   Code status:   Code Status: Full Code   DVT Prophylaxis: apixaban (ELIQUIS) tablet 5 mg     Family Communication: None at bedside   Disposition Plan: Status is: Observation The patient will require care spanning > 2 midnights and should be moved to inpatient because: Improving volume status-still in RVR-on IV Cardizem-on IV diuretics-not yet stable for discharge.   Planned Discharge Destination:Home the next 1-2 days.   Diet: Diet Order             Diet Heart Room service appropriate? Yes; Fluid consistency: Thin  Diet effective now                     Antimicrobial agents: Anti-infectives (From admission, onward)    None        MEDICATIONS: Scheduled Meds:  allopurinol  100 mg Oral Daily   apixaban  5 mg Oral BID   digoxin  0.125 mg Oral Daily   furosemide  40 mg Intravenous BID   metoprolol tartrate  50 mg Oral TID   sodium chloride flush  3 mL Intravenous Q12H   Continuous Infusions:  diltiazem (CARDIZEM) infusion 15 mg/hr (04/04/22 0600)   PRN Meds:.acetaminophen **OR** acetaminophen, hydrALAZINE, ondansetron **OR** ondansetron (ZOFRAN) IV   I have personally reviewed following labs and imaging studies  LABORATORY DATA: CBC: Recent Labs  Lab 04/02/22 1116 04/03/22 0501  WBC 5.7 5.6  HGB 14.1 12.8*  HCT 41.2 38.0*  MCV 96.0 95.7  PLT 167 161     Basic Metabolic Panel: Recent Labs  Lab 04/02/22 1116 04/02/22 1149 04/03/22 0501 04/04/22 0312   NA 142  --  146* 141  K 3.7  --  3.6 3.4*  CL 111  --  107 107  CO2 22  --  26 25  GLUCOSE 152*  --  98 119*  BUN 16  --  14 14  CREATININE 1.51*  --  1.47* 1.54*  CALCIUM 8.9  --  9.1 8.7*  MG  --  1.9  --  1.8     GFR: Estimated Creatinine Clearance: 62.6 mL/min (A) (by C-G formula based on SCr of 1.54 mg/dL (H)).  Liver Function Tests: Recent Labs  Lab 04/02/22 1149  AST 28  ALT 16  ALKPHOS 57  BILITOT 1.9*  PROT 6.5  ALBUMIN 3.0*    No results for input(s): "LIPASE", "AMYLASE" in the last 168 hours. No results for input(s): "AMMONIA" in the last 168 hours.  Coagulation Profile: Recent Labs  Lab 04/02/22 1116  INR 1.3*     Cardiac Enzymes: No results for input(s): "CKTOTAL", "CKMB", "CKMBINDEX", "TROPONINI" in the last 168 hours.  BNP (last 3 results) No results for input(s): "PROBNP" in the last 8760 hours.  Lipid Profile: Recent Labs    04/02/22 2152  CHOL 121  HDL 24*  LDLCALC 83  TRIG 71  CHOLHDL 5.0     Thyroid Function Tests: Recent Labs    04/02/22 2152  TSH 1.733     Anemia Panel: No results for input(s): "VITAMINB12", "FOLATE", "FERRITIN", "TIBC", "IRON", "RETICCTPCT" in the last 72 hours.  Urine analysis:    Component Value Date/Time   COLORURINE YELLOW 10/31/2016 2040   APPEARANCEUR CLEAR 10/31/2016 2040   LABSPEC 1.021 10/31/2016 2040   PHURINE 5.0 10/31/2016 2040   GLUCOSEU NEGATIVE 10/31/2016 2040   HGBUR MODERATE (A) 10/31/2016 2040   BILIRUBINUR NEGATIVE 10/31/2016 2040   KETONESUR NEGATIVE 10/31/2016 2040   PROTEINUR 30 (A) 10/31/2016 2040   NITRITE NEGATIVE 10/31/2016 2040   LEUKOCYTESUR NEGATIVE 10/31/2016 2040    Sepsis Labs: Lactic Acid, Venous No results found for: "LATICACIDVEN"  MICROBIOLOGY: No results found for this or any previous visit (from the past 240 hour(s)).  RADIOLOGY STUDIES/RESULTS: ECHOCARDIOGRAM COMPLETE  Result Date: 04/03/2022    ECHOCARDIOGRAM REPORT   Patient Name:   Javier Berry Date of Exam: 04/03/2022 Medical Rec #:  [id number]      Height:       72.0 in Accession #:    [id number]     Weight:       239.0 lb Date of Birth:  1961-06-29      BSA:          2.298 m Patient Age:    36 years       BP:           93/55 mmHg Patient Gender: M  HR:           113 bpm. Exam Location:  Inpatient Procedure: 2D Echo, Cardiac Doppler and Color Doppler Indications:    CHF-Acute Systolic I50.21  History:        Patient has prior history of Echocardiogram examinations, most                 recent 04/26/2019. Cardiomyopathy and CHF, Arrythmias:Atrial                 Fibrillation; Risk Factors:Hypertension.  Sonographer:    Eulah Pont RDCS Referring Phys: [id number] JENNIFER YATES IMPRESSIONS  1. Left ventricular ejection fraction, by estimation, is 30 to 35%. The left ventricle has moderately decreased function. The left ventricle demonstrates global hypokinesis. There is mild left ventricular hypertrophy. Left ventricular diastolic parameters are indeterminate.  2. Right ventricular systolic function is mildly reduced. The right ventricular size is mildly enlarged.  3. Left atrial size was severely dilated.  4. Right atrial size was severely dilated.  5. The mitral valve is normal in structure. Trivial mitral valve regurgitation. No evidence of mitral stenosis.  6. Tricuspid valve regurgitation is mild to moderate.  7. The aortic valve is normal in structure. Aortic valve regurgitation is not visualized. No aortic stenosis is present.  8. The inferior vena cava is dilated in size with <50% respiratory variability, suggesting right atrial pressure of 15 mmHg. Comparison(s): Prior images reviewed side by side. The left ventricular function is worsened. ECHO 2020 - EF 55-60%. FINDINGS  Left Ventricle: Left ventricular ejection fraction, by estimation, is 30 to 35%. The left ventricle has moderately decreased function. The left ventricle demonstrates global hypokinesis. The left ventricular internal  cavity size was normal in size. There is mild left ventricular hypertrophy. Left ventricular diastolic parameters are indeterminate.  LV Wall Scoring: The inferior septum is akinetic. Right Ventricle: The right ventricular size is mildly enlarged. No increase in right ventricular wall thickness. Right ventricular systolic function is mildly reduced. Left Atrium: Left atrial size was severely dilated. Right Atrium: Right atrial size was severely dilated. Pericardium: There is no evidence of pericardial effusion. Mitral Valve: The mitral valve is normal in structure. Trivial mitral valve regurgitation. No evidence of mitral valve stenosis. Tricuspid Valve: The tricuspid valve is normal in structure. Tricuspid valve regurgitation is mild to moderate. No evidence of tricuspid stenosis. Aortic Valve: The aortic valve is normal in structure. Aortic valve regurgitation is not visualized. No aortic stenosis is present. Pulmonic Valve: The pulmonic valve was normal in structure. Pulmonic valve regurgitation is not visualized. No evidence of pulmonic stenosis. Aorta: The aortic root is normal in size and structure. Venous: The inferior vena cava is dilated in size with less than 50% respiratory variability, suggesting right atrial pressure of 15 mmHg. IAS/Shunts: No atrial level shunt detected by color flow Doppler.  LEFT VENTRICLE PLAX 2D LVIDd:         5.00 cm LVIDs:         4.10 cm LV PW:         1.40 cm LV IVS:        1.00 cm LVOT diam:     1.90 cm LV SV:         40 LV SV Index:   17 LVOT Area:     2.84 cm  LV Volumes (MOD) LV vol d, MOD A2C: 129.0 ml LV vol d, MOD A4C: 143.0 ml LV vol s, MOD A2C: 76.7 ml LV vol s, MOD A4C:  84.1 ml LV SV MOD A2C:     52.3 ml LV SV MOD A4C:     143.0 ml LV SV MOD BP:      52.0 ml RIGHT VENTRICLE TAPSE (M-mode): 1.3 cm LEFT ATRIUM            Index        RIGHT ATRIUM           Index LA diam:      5.30 cm  2.31 cm/m   RA Area:     44.50 cm LA Vol (A2C): 126.0 ml 54.83 ml/m  RA Volume:    200.00 ml 87.03 ml/m LA Vol (A4C): 122.0 ml 53.09 ml/m  AORTIC VALVE LVOT Vmax:   84.10 cm/s LVOT Vmean:  55.700 cm/s LVOT VTI:    0.141 m  AORTA Ao Root diam: 3.00 cm Ao Asc diam:  3.30 cm MR Peak grad:    61.5 mmHg    TRICUSPID VALVE MR Mean grad:    43.0 mmHg    TR Peak grad:   28.7 mmHg MR Vmax:         392.00 cm/s  TR Vmax:        268.00 cm/s MR Vmean:        310.0 cm/s MR PISA:         0.57 cm     SHUNTS MR PISA Eff ROA: 6 mm        Systemic VTI:  0.14 m MR PISA Radius:  0.30 cm      Systemic Diam: 1.90 cm Candee Furbish MD Electronically signed by Candee Furbish MD Signature Date/Time: 04/03/2022/11:25:34 AM    Final    DG Chest Port 1 View  Result Date: 04/02/2022 CLINICAL DATA:  Dyspnea, shortness of breath. EXAM: PORTABLE CHEST 1 VIEW COMPARISON:  Chest radiograph dated 11/06/2016. FINDINGS: The heart size is enlarged. Both lungs are clear. The visualized skeletal structures are unremarkable. IMPRESSION: Cardiomegaly.  No active disease. Electronically Signed   By: Zerita Boers M.D.   On: 04/02/2022 11:46     LOS: 1 day   Oren Binet, MD  [facility]    To contact the attending provider between 7A-7P or the covering provider during after hours 7P-7A, please log into the web site [email or web address] and access using universal [place] password for that web site. If you do not have the password, please call the hospital operator.  04/04/2022, 10:52 AM

## 2022-04-04 NOTE — Progress Notes (Signed)
Heart Failure Stewardship Pharmacist Progress Note   PCP: Pcp, No PCP-Cardiologist: [name], MD    HPI:  61 yo M with PMH of HFimpEF- NICM, CKD III, HTN, and afib.   He originally followed with Dr. Jens Som for afib since 2015. LVEF first noted to drop in 01/2015 to 35-40% from 60-65% in 2015. LVEF dropped further to 30-35% during admission for SBO and afib RVR. He was last seen in 2020 where EF had improved to 55-60% but was lost to follow up afterward.  He presented to the ED on 7/5 complaining of shortness of breath and found to be in afib RVR. He reports he had been out of metoprolol for 6 months. He reports seeing cardiology with [facility] since 2020. CXR with cardiomegaly. ECHO on 7/6 with LVEF back down to 30-35% with mildly reduced RV. Digoxin gtt transitioned to amiodarone with LV dysfunction.  Current HF Medications: Diuretic: furosemide 40 mg IV BID Beta Blocker: metoprolol tartrate 75 mg TID Other: digoxin 0.125 mg daily  Prior to admission HF Medications: Diuretic: furosemide 40 mg daily  Pertinent Lab Values: Serum creatinine 1.54, BUN 14, Potassium 3.4, Sodium 141, BNP 444.1, Magnesium 1.8, A1c 6.2, Digoxin level due 7/10  Vital Signs: Weight: 234 lbs (admission weight: 239 lbs) Blood pressure: 110/80s  Heart rate: 110s - afib I/O: -1.2L yesterday; net -3.4L  Medication Assistance / Insurance Benefits Check: Does the patient have prescription insurance?  Yes Type of insurance plan: [employer]  Outpatient Pharmacy:  Prior to admission outpatient [facility] Is the patient willing to use [facility] TOC pharmacy at discharge: Yes Is the patient willing to transition their outpatient pharmacy to utilize a [facility] outpatient pharmacy?   Pending    Assessment: 1. Acute on chronic systolic CHF (LVEF 30-35%), due to afib RVR. NYHA class III symptoms. - Still volume overloaded on exam - continue furosemide 40 mg IV BID. KCl 40 mEq x 1  given. Need magnesium replacement. Keep K>4 and Mag>2. - Agree with increasing metoprolol tartrate to 75 mg TID - will need to convert to XL prior to discharge with LV dysfunction. - No ACE/ARB/ARNI with soft BP - Consider starting spironolactone 12.5 mg daily  for GDMT optimization and since BP too soft for ARB or Entresto - Digoxin started 7/6 - level due in 5 days. Check on Monday with AM labs.  - Consider starting Farxiga 10 mg daily for GDMT optimization   Plan: 1) Medication changes recommended at this time: - Magnesium 2 g IV x 1 - Start Farxiga 10 mg daily - Start spironolactone 12.5 mg daily tomorrow if BP ok  2) Patient assistance: - Has [employer] - can use monthly copay cards  3)  Education  - To be completed prior to discharge  Sharen Hones, PharmD, BCPS Heart Failure Engineer, building services Phone [phone]

## 2022-04-04 NOTE — Progress Notes (Addendum)
Progress Note  Patient Name: Javier Berry Date of Encounter: 04/04/2022  [facility] Cardiologist: Kirk Ruths, MD   Subjective   No acute overnight events. Patient states he is feeling better every day. Shortness of breath has improved. He denies any orthopnea. No chest pain. He remains in atrial fibrillation with rates in the 100s to 120s at rest; however, he is asymptomatic with this. He no longer is having any heart racing.  Inpatient Medications    Scheduled Meds:  allopurinol  100 mg Oral Daily   apixaban  5 mg Oral BID   digoxin  0.125 mg Oral Daily   furosemide  40 mg Intravenous BID   metoprolol tartrate  75 mg Oral TID   sodium chloride flush  3 mL Intravenous Q12H   Continuous Infusions:  diltiazem (CARDIZEM) infusion 15 mg/hr (04/04/22 0600)   PRN Meds: acetaminophen **OR** acetaminophen, hydrALAZINE, ondansetron **OR** ondansetron (ZOFRAN) IV   Vital Signs    Vitals:   04/03/22 2022 04/03/22 2340 04/04/22 0400 04/04/22 0744  BP: (!) 119/99 124/84 111/82 106/84  Pulse: 67 (!) 123 98 93  Resp: 18 20 17 16   Temp: 98.9 F (37.2 C) 98.3 F (36.8 C) 97.9 F (36.6 C) 98 F (36.7 C)  TempSrc: Oral Oral Oral Oral  SpO2: 96% 99% 96% 98%  Weight:   106.1 kg   Height:        Intake/Output Summary (Last 24 hours) at 04/04/2022 1129 Last data filed at 04/04/2022 0600 Gross per 24 hour  Intake 1057.88 ml  Output 1600 ml  Net -542.12 ml      04/04/2022    4:00 AM 04/02/2022   11:24 AM 04/19/2019    8:30 AM  Last 3 Weights  Weight (lbs) 234 lb 239 lb 222 lb  Weight (kg) 106.142 kg 108.41 kg 100.699 kg      Telemetry    Atrial fibrillation with rates in the 100s to 120s. - Personally Reviewed  ECG    No new ECG tracing today. - Personally Reviewed  Physical Exam   GEN: No acute distress.   Neck: Positive JVD. Cardiac: Tachycardic with irregularly irregular rhythm. No murmurs, rubs, or gallops.  Respiratory: Clear to auscultation bilaterally. No  wheezes, rhonchi, or rales. GI: Soft, nontender, non-distended  MS: Trace lower extremity edema bilaterally. No deformity. Skin: Warm and dry. Neuro:  No focal deficits. Psych: Normal affect. Responds appropriately.  Labs    High Sensitivity Troponin:  No results for input(s): "TROPONINIHS" in the last 720 hours.   Chemistry Recent Labs  Lab 04/02/22 1116 04/02/22 1149 04/03/22 0501 04/04/22 0312  NA 142  --  146* 141  K 3.7  --  3.6 3.4*  CL 111  --  107 107  CO2 22  --  26 25  GLUCOSE 152*  --  98 119*  BUN 16  --  14 14  CREATININE 1.51*  --  1.47* 1.54*  CALCIUM 8.9  --  9.1 8.7*  MG  --  1.9  --  1.8  PROT  --  6.5  --   --   ALBUMIN  --  3.0*  --   --   AST  --  28  --   --   ALT  --  16  --   --   ALKPHOS  --  57  --   --   BILITOT  --  1.9*  --   --   GFRNONAA 52*  --  54* 51*  ANIONGAP 9  --  13 9    Lipids  Recent Labs  Lab 04/02/22 2152  CHOL 121  TRIG 71  HDL 24*  LDLCALC 83  CHOLHDL 5.0    Hematology Recent Labs  Lab 04/02/22 1116 04/03/22 0501  WBC 5.7 5.6  RBC 4.29 3.97*  HGB 14.1 12.8*  HCT 41.2 38.0*  MCV 96.0 95.7  MCH 32.9 32.2  MCHC 34.2 33.7  RDW 15.3 15.1  PLT 167 161   Thyroid  Recent Labs  Lab 04/02/22 2152  TSH 1.733    BNP Recent Labs  Lab 04/02/22 1149  BNP 444.1*    DDimer No results for input(s): "DDIMER" in the last 168 hours.   Radiology    ECHOCARDIOGRAM COMPLETE  Result Date: 04/03/2022    ECHOCARDIOGRAM REPORT   Patient Name:   Javier Berry Date of Exam: 04/03/2022 Medical Rec #:  [id number]      Height:       72.0 in Accession #:    [id number]     Weight:       239.0 lb Date of Birth:  03-30-1961      BSA:          2.298 m Patient Age:    60 years       BP:           93/55 mmHg Patient Gender: M              HR:           113 bpm. Exam Location:  Inpatient Procedure: 2D Echo, Cardiac Doppler and Color Doppler Indications:    CHF-Acute Systolic I50.21  History:        Patient has prior history of  Echocardiogram examinations, most                 recent 04/26/2019. Cardiomyopathy and CHF, Arrythmias:Atrial                 Fibrillation; Risk Factors:Hypertension.  Sonographer:    Eulah Pont RDCS Referring Phys: [id number] JENNIFER YATES IMPRESSIONS  1. Left ventricular ejection fraction, by estimation, is 30 to 35%. The left ventricle has moderately decreased function. The left ventricle demonstrates global hypokinesis. There is mild left ventricular hypertrophy. Left ventricular diastolic parameters are indeterminate.  2. Right ventricular systolic function is mildly reduced. The right ventricular size is mildly enlarged.  3. Left atrial size was severely dilated.  4. Right atrial size was severely dilated.  5. The mitral valve is normal in structure. Trivial mitral valve regurgitation. No evidence of mitral stenosis.  6. Tricuspid valve regurgitation is mild to moderate.  7. The aortic valve is normal in structure. Aortic valve regurgitation is not visualized. No aortic stenosis is present.  8. The inferior vena cava is dilated in size with <50% respiratory variability, suggesting right atrial pressure of 15 mmHg. Comparison(s): Prior images reviewed side by side. The left ventricular function is worsened. ECHO 2020 - EF 55-60%. FINDINGS  Left Ventricle: Left ventricular ejection fraction, by estimation, is 30 to 35%. The left ventricle has moderately decreased function. The left ventricle demonstrates global hypokinesis. The left ventricular internal cavity size was normal in size. There is mild left ventricular hypertrophy. Left ventricular diastolic parameters are indeterminate.  LV Wall Scoring: The inferior septum is akinetic. Right Ventricle: The right ventricular size is mildly enlarged. No increase in right ventricular wall thickness. Right ventricular systolic function is mildly reduced. Left Atrium: Left  atrial size was severely dilated. Right Atrium: Right atrial size was severely dilated.  Pericardium: There is no evidence of pericardial effusion. Mitral Valve: The mitral valve is normal in structure. Trivial mitral valve regurgitation. No evidence of mitral valve stenosis. Tricuspid Valve: The tricuspid valve is normal in structure. Tricuspid valve regurgitation is mild to moderate. No evidence of tricuspid stenosis. Aortic Valve: The aortic valve is normal in structure. Aortic valve regurgitation is not visualized. No aortic stenosis is present. Pulmonic Valve: The pulmonic valve was normal in structure. Pulmonic valve regurgitation is not visualized. No evidence of pulmonic stenosis. Aorta: The aortic root is normal in size and structure. Venous: The inferior vena cava is dilated in size with less than 50% respiratory variability, suggesting right atrial pressure of 15 mmHg. IAS/Shunts: No atrial level shunt detected by color flow Doppler.  LEFT VENTRICLE PLAX 2D LVIDd:         5.00 cm LVIDs:         4.10 cm LV PW:         1.40 cm LV IVS:        1.00 cm LVOT diam:     1.90 cm LV SV:         40 LV SV Index:   17 LVOT Area:     2.84 cm  LV Volumes (MOD) LV vol d, MOD A2C: 129.0 ml LV vol d, MOD A4C: 143.0 ml LV vol s, MOD A2C: 76.7 ml LV vol s, MOD A4C: 84.1 ml LV SV MOD A2C:     52.3 ml LV SV MOD A4C:     143.0 ml LV SV MOD BP:      52.0 ml RIGHT VENTRICLE TAPSE (M-mode): 1.3 cm LEFT ATRIUM            Index        RIGHT ATRIUM           Index LA diam:      5.30 cm  2.31 cm/m   RA Area:     44.50 cm LA Vol (A2C): 126.0 ml 54.83 ml/m  RA Volume:   200.00 ml 87.03 ml/m LA Vol (A4C): 122.0 ml 53.09 ml/m  AORTIC VALVE LVOT Vmax:   84.10 cm/s LVOT Vmean:  55.700 cm/s LVOT VTI:    0.141 m  AORTA Ao Root diam: 3.00 cm Ao Asc diam:  3.30 cm MR Peak grad:    61.5 mmHg    TRICUSPID VALVE MR Mean grad:    43.0 mmHg    TR Peak grad:   28.7 mmHg MR Vmax:         392.00 cm/s  TR Vmax:        268.00 cm/s MR Vmean:        310.0 cm/s MR PISA:         0.57 cm     SHUNTS MR PISA Eff ROA: 6 mm        Systemic  VTI:  0.14 m MR PISA Radius:  0.30 cm      Systemic Diam: 1.90 cm Donato Schultz MD Electronically signed by Donato Schultz MD Signature Date/Time: 04/03/2022/11:25:34 AM    Final    DG Chest Port 1 View  Result Date: 04/02/2022 CLINICAL DATA:  Dyspnea, shortness of breath. EXAM: PORTABLE CHEST 1 VIEW COMPARISON:  Chest radiograph dated 11/06/2016. FINDINGS: The heart size is enlarged. Both lungs are clear. The visualized skeletal structures are unremarkable. IMPRESSION: Cardiomegaly.  No active disease. Electronically Signed   By:  Zerita Boers M.D.   On: 04/02/2022 11:46    Cardiac Studies   Echocardiogram 04/03/2022: Impressions:  1. Left ventricular ejection fraction, by estimation, is 30 to 35%. The  left ventricle has moderately decreased function. The left ventricle  demonstrates global hypokinesis. There is mild left ventricular  hypertrophy. Left ventricular diastolic  parameters are indeterminate.   2. Right ventricular systolic function is mildly reduced. The right  ventricular size is mildly enlarged.   3. Left atrial size was severely dilated.   4. Right atrial size was severely dilated.   5. The mitral valve is normal in structure. Trivial mitral valve  regurgitation. No evidence of mitral stenosis.   6. Tricuspid valve regurgitation is mild to moderate.   7. The aortic valve is normal in structure. Aortic valve regurgitation is  not visualized. No aortic stenosis is present.   8. The inferior vena cava is dilated in size with <50% respiratory  variability, suggesting right atrial pressure of 15 mmHg.   Comparison(s): Prior images reviewed side by side. The left ventricular  function is worsened. ECHO 2020 - EF 55-60%.    Patient Profile     61 y.o. male with a history of nonischemic cardiomyopathy/ chronic systolic CHF with elevation of EF Echo in 2020, permanent atrial fibrillation on Eliquis, hypertension, CKD stage III, gout, PUD, and medication noncompliance date on 04/02/2022  for atrial fibrillation with RVR and acute on chronic CHF after presenting with shortness of breath, edema, and palpitations (heart racing).  Assessment & Plan    Permanent Atrial Fibrillation with RVR Patient presented with uncontrolled atrial fibrillation due to medication noncompliance. He was has been started on IV Diltiazem, Metoprolol, and Digoxin. Echo on 7/6 showed LVEF of 30-35%. - Rates still uncontrolled in the 100s to 120s. - Currently on IV Diltiazem at 15mg /hr, Lopressor 50mg  three times daily, and Digoxin 0.125mg  daily. Will increase Lopressor to 75mg  three times daily. - Continue chronic anticoagulation with Eliquis 5mg  twice daily. - Patient states he has had a DCCV in the past but it was unsuccessful. He has severe biatrial enlargement on Echo so think it is unlikely that he will hold sinus rhythm. We ultimately need to get him off IV Diltiazem given reduced EF. He is on 15mg /hr of this and rates are still uncontrolled. Amiodarone may be a short-term option but not a great long-term option due to his age. Will discuss with MD.   Acute on Chronic Systolic CHF Non-Ischemic Cardiomyopathy BNP elevated at 444. Chest x-ray showed cardiomegaly with no overt edema. Echo showed LVEF of 30-35% with global hypokinesis and mild LVH, severe biatrial enlargement, and mild to moderate TR. Patient is being diuresed with IV Lasix. Documented urinary output of 1.6 L yesterday and net negative 2.698 L this admission. Weight down 5lbs from admission. Renal function stable. - Still has evidence of volume overload on exam. - Continue IV Lasix 40mg  twice daily. - Patient previously on Lisinopril but reported noted taking this on admission. He would ultimately be a good Entresto candidate but BP soft at times. Will hold off on adding Losartan or Entresto for now to allow more room for rate control and IV diuresis.  - Continue Lopressor for now. Would transition to Toprol-XL prior to discharge. -  Continue Digoxin 0.125mg  daily. - Continue to monitor daily weights, strict I/Os, and renal function. - Suspect etiology is tachymediated given it sounds like he has probably been in uncontrolled atrial fibrillation for 3 months. However, he has  never had a cardiac catheterization. Prior Myoview in 2016 showed no ischemia. He probably warrants definitive evaluation of his coronaries at some point.  Hypertension BP soft at times but stable. - Continue medications for CHF as above.  CKD Stage III Creatinine 1.51 >> 1.47 >> 1.54. Baseline around 1.3 to 1.8. - Continue to monitor closely with diuresis.  Hypokalemia Potassium 3.4 today. - Repleted by primary team.   For questions or updates, please contact [place] Please consult [email or web address] for contact info under        Signed, Darreld Mclean, PA-C  04/04/2022, 11:29 AM    Personally seen and examined. 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  Legs still swollen.  No symptoms in AF. No CP, SOB, palpitations, syncope Exam notable for IRIR tachycardia, JVD to with HJR, non pitting edema, otherwise as above.  Labs notable for K 3.5, Creatine 1.57 stable Tele: AF rates 120s on IV diltiazem  Would recommend  - stop IV diltiazem, start IV amiodarone Continue lopressor will consolidate to succinate near discharge - continue 40 IV lasix BID - potentially low dose ARB at discharge and outpatient ARNI if low BP here.  Rudean Haskell, MD [place]  [id number] [place], [place] [address] [phone]  1:30 PM

## 2022-04-05 DIAGNOSIS — I5023 Acute on chronic systolic (congestive) heart failure: Secondary | ICD-10-CM | POA: Diagnosis not present

## 2022-04-05 DIAGNOSIS — I4891 Unspecified atrial fibrillation: Secondary | ICD-10-CM | POA: Diagnosis not present

## 2022-04-05 LAB — BASIC METABOLIC PANEL
Anion gap: 8 (ref 5–15)
BUN: 16 mg/dL (ref 8–23)
CO2: 26 mmol/L (ref 22–32)
Calcium: 8.6 mg/dL — ABNORMAL LOW (ref 8.9–10.3)
Chloride: 107 mmol/L (ref 98–111)
Creatinine, Ser: 1.75 mg/dL — ABNORMAL HIGH (ref 0.61–1.24)
GFR, Estimated: 43 mL/min — ABNORMAL LOW (ref >60)
Glucose, Bld: 145 mg/dL — ABNORMAL HIGH (ref 70–99)
Potassium: 3.7 mmol/L (ref 3.5–5.1)
Sodium: 141 mmol/L (ref 135–145)

## 2022-04-05 LAB — MAGNESIUM: Magnesium: 2.2 mg/dL (ref 1.7–2.4)

## 2022-04-05 MED ORDER — TRAMADOL HCL 50 MG PO TABS
50.0000 mg | ORAL_TABLET | Freq: Four times a day (QID) | ORAL | Status: DC | PRN
  Administered 2022-04-05 – 2022-04-06 (×3): 50 mg via ORAL
  Filled 2022-04-05 (×4): qty 1

## 2022-04-05 MED ORDER — FUROSEMIDE 40 MG PO TABS
40.0000 mg | ORAL_TABLET | Freq: Two times a day (BID) | ORAL | Status: DC
  Administered 2022-04-05 – 2022-04-06 (×2): 40 mg via ORAL
  Filled 2022-04-05 (×2): qty 1

## 2022-04-05 NOTE — Progress Notes (Signed)
Progress Note  Patient Name: Javier Berry Date of Encounter: 04/05/2022  [facility] Cardiologist: Kirk Ruths, MD   Subjective   .  Feeling better today.  Shortness of breath is much improved.  No further orthopnea.  Atrial fibrillation remains, as her rates are better controlled.  Inpatient Medications    Scheduled Meds:  allopurinol  100 mg Oral Daily   apixaban  5 mg Oral BID   digoxin  0.125 mg Oral Daily   furosemide  40 mg Intravenous BID   metoprolol tartrate  75 mg Oral TID   sodium chloride flush  3 mL Intravenous Q12H   Continuous Infusions:  amiodarone 30 mg/hr (04/05/22 0736)   PRN Meds: acetaminophen **OR** acetaminophen, hydrALAZINE, ondansetron **OR** ondansetron (ZOFRAN) IV   Vital Signs    Vitals:   04/04/22 2326 04/05/22 0126 04/05/22 0459 04/05/22 0500  BP: 110/81 (!) 127/96 (!) 137/96 (!) 137/96  Pulse: (!) 121 60 78 (!) 111  Resp: 19  17   Temp: 97.8 F (36.6 C)  98.1 F (36.7 C)   TempSrc: Axillary  Oral   SpO2: 98% 95% 98% 99%  Weight:   107 kg   Height:        Intake/Output Summary (Last 24 hours) at 04/05/2022 [id number] Last data filed at 04/05/2022 0900 Gross per 24 hour  Intake 1020.14 ml  Output 2625 ml  Net -1604.86 ml       04/05/2022    4:59 AM 04/04/2022    4:00 AM 04/02/2022   11:24 AM  Last 3 Weights  Weight (lbs) 235 lb 12.8 oz 234 lb 239 lb  Weight (kg) 106.958 kg 106.142 kg 108.41 kg      Telemetry    Atrial fibrillation-personally reviewed  ECG    None new  Physical Exam   GEN: Well nourished, well developed, in no acute distress  HEENT: normal  Neck: no JVD, carotid bruits, or masses Cardiac: irregular; no murmurs, rubs, or gallops, 1+ edema  Respiratory:  clear to auscultation bilaterally, normal work of breathing GI: soft, nontender, nondistended, + BS MS: no deformity or atrophy  Skin: warm and dry Neuro:  Strength and sensation are intact Psych: euthymic mood, full affect   Labs    High  Sensitivity Troponin:  No results for input(s): "TROPONINIHS" in the last 720 hours.   Chemistry Recent Labs  Lab 04/02/22 1149 04/03/22 0501 04/04/22 0312 04/05/22 0239  NA  --  146* 141 141  K  --  3.6 3.4* 3.7  CL  --  107 107 107  CO2  --  26 25 26   GLUCOSE  --  98 119* 145*  BUN  --  14 14 16   CREATININE  --  1.47* 1.54* 1.75*  CALCIUM  --  9.1 8.7* 8.6*  MG 1.9  --  1.8 2.2  PROT 6.5  --   --   --   ALBUMIN 3.0*  --   --   --   AST 28  --   --   --   ALT 16  --   --   --   ALKPHOS 57  --   --   --   BILITOT 1.9*  --   --   --   GFRNONAA  --  54* 51* 43*  ANIONGAP  --  13 9 8      Lipids  Recent Labs  Lab 04/02/22 2152  CHOL 121  TRIG 71  HDL 24*  LDLCALC 83  CHOLHDL 5.0     Hematology Recent Labs  Lab 04/02/22 1116 04/03/22 0501  WBC 5.7 5.6  RBC 4.29 3.97*  HGB 14.1 12.8*  HCT 41.2 38.0*  MCV 96.0 95.7  MCH 32.9 32.2  MCHC 34.2 33.7  RDW 15.3 15.1  PLT 167 161    Thyroid  Recent Labs  Lab 04/02/22 2152  TSH 1.733     BNP Recent Labs  Lab 04/02/22 1149  BNP 444.1*     DDimer No results for input(s): "DDIMER" in the last 168 hours.   Radiology    ECHOCARDIOGRAM COMPLETE  Result Date: 04/03/2022    ECHOCARDIOGRAM REPORT   Patient Name:   Javier Berry Date of Exam: 04/03/2022 Medical Rec #:  [id number]      Height:       72.0 in Accession #:    [id number]     Weight:       239.0 lb Date of Birth:  December 29, 1960      BSA:          2.298 m Patient Age:    61 years       BP:           93/55 mmHg Patient Gender: M              HR:           113 bpm. Exam Location:  Inpatient Procedure: 2D Echo, Cardiac Doppler and Color Doppler Indications:    CHF-Acute Systolic I50.21  History:        Patient has prior history of Echocardiogram examinations, most                 recent 04/26/2019. Cardiomyopathy and CHF, Arrythmias:Atrial                 Fibrillation; Risk Factors:Hypertension.  Sonographer:    Eulah Pont RDCS Referring Phys: [id number] JENNIFER YATES  IMPRESSIONS  1. Left ventricular ejection fraction, by estimation, is 30 to 35%. The left ventricle has moderately decreased function. The left ventricle demonstrates global hypokinesis. There is mild left ventricular hypertrophy. Left ventricular diastolic parameters are indeterminate.  2. Right ventricular systolic function is mildly reduced. The right ventricular size is mildly enlarged.  3. Left atrial size was severely dilated.  4. Right atrial size was severely dilated.  5. The mitral valve is normal in structure. Trivial mitral valve regurgitation. No evidence of mitral stenosis.  6. Tricuspid valve regurgitation is mild to moderate.  7. The aortic valve is normal in structure. Aortic valve regurgitation is not visualized. No aortic stenosis is present.  8. The inferior vena cava is dilated in size with <50% respiratory variability, suggesting right atrial pressure of 15 mmHg. Comparison(s): Prior images reviewed side by side. The left ventricular function is worsened. ECHO 2020 - EF 55-60%. FINDINGS  Left Ventricle: Left ventricular ejection fraction, by estimation, is 30 to 35%. The left ventricle has moderately decreased function. The left ventricle demonstrates global hypokinesis. The left ventricular internal cavity size was normal in size. There is mild left ventricular hypertrophy. Left ventricular diastolic parameters are indeterminate.  LV Wall Scoring: The inferior septum is akinetic. Right Ventricle: The right ventricular size is mildly enlarged. No increase in right ventricular wall thickness. Right ventricular systolic function is mildly reduced. Left Atrium: Left atrial size was severely dilated. Right Atrium: Right atrial size was severely dilated. Pericardium: There is no evidence of pericardial effusion. Mitral Valve: The mitral valve is  normal in structure. Trivial mitral valve regurgitation. No evidence of mitral valve stenosis. Tricuspid Valve: The tricuspid valve is normal in structure.  Tricuspid valve regurgitation is mild to moderate. No evidence of tricuspid stenosis. Aortic Valve: The aortic valve is normal in structure. Aortic valve regurgitation is not visualized. No aortic stenosis is present. Pulmonic Valve: The pulmonic valve was normal in structure. Pulmonic valve regurgitation is not visualized. No evidence of pulmonic stenosis. Aorta: The aortic root is normal in size and structure. Venous: The inferior vena cava is dilated in size with less than 50% respiratory variability, suggesting right atrial pressure of 15 mmHg. IAS/Shunts: No atrial level shunt detected by color flow Doppler.  LEFT VENTRICLE PLAX 2D LVIDd:         5.00 cm LVIDs:         4.10 cm LV PW:         1.40 cm LV IVS:        1.00 cm LVOT diam:     1.90 cm LV SV:         40 LV SV Index:   17 LVOT Area:     2.84 cm  LV Volumes (MOD) LV vol d, MOD A2C: 129.0 ml LV vol d, MOD A4C: 143.0 ml LV vol s, MOD A2C: 76.7 ml LV vol s, MOD A4C: 84.1 ml LV SV MOD A2C:     52.3 ml LV SV MOD A4C:     143.0 ml LV SV MOD BP:      52.0 ml RIGHT VENTRICLE TAPSE (M-mode): 1.3 cm LEFT ATRIUM            Index        RIGHT ATRIUM           Index LA diam:      5.30 cm  2.31 cm/m   RA Area:     44.50 cm LA Vol (A2C): 126.0 ml 54.83 ml/m  RA Volume:   200.00 ml 87.03 ml/m LA Vol (A4C): 122.0 ml 53.09 ml/m  AORTIC VALVE LVOT Vmax:   84.10 cm/s LVOT Vmean:  55.700 cm/s LVOT VTI:    0.141 m  AORTA Ao Root diam: 3.00 cm Ao Asc diam:  3.30 cm MR Peak grad:    61.5 mmHg    TRICUSPID VALVE MR Mean grad:    43.0 mmHg    TR Peak grad:   28.7 mmHg MR Vmax:         392.00 cm/s  TR Vmax:        268.00 cm/s MR Vmean:        310.0 cm/s MR PISA:         0.57 cm     SHUNTS MR PISA Eff ROA: 6 mm        Systemic VTI:  0.14 m MR PISA Radius:  0.30 cm      Systemic Diam: 1.90 cm Candee Furbish MD Electronically signed by Candee Furbish MD Signature Date/Time: 04/03/2022/11:25:34 AM    Final     Cardiac Studies   Echocardiogram 04/03/2022: Impressions:  1. Left  ventricular ejection fraction, by estimation, is 30 to 35%. The  left ventricle has moderately decreased function. The left ventricle  demonstrates global hypokinesis. There is mild left ventricular  hypertrophy. Left ventricular diastolic  parameters are indeterminate.   2. Right ventricular systolic function is mildly reduced. The right  ventricular size is mildly enlarged.   3. Left atrial size was severely dilated.   4. Right atrial  size was severely dilated.   5. The mitral valve is normal in structure. Trivial mitral valve  regurgitation. No evidence of mitral stenosis.   6. Tricuspid valve regurgitation is mild to moderate.   7. The aortic valve is normal in structure. Aortic valve regurgitation is  not visualized. No aortic stenosis is present.   8. The inferior vena cava is dilated in size with <50% respiratory  variability, suggesting right atrial pressure of 15 mmHg.   Comparison(s): Prior images reviewed side by side. The left ventricular  function is worsened. ECHO 2020 - EF 55-60%.    Patient Profile     61 y.o. male with a history of nonischemic cardiomyopathy/ chronic systolic CHF with elevation of EF Echo in 2020, permanent atrial fibrillation on Eliquis, hypertension, CKD stage III, gout, PUD, and medication noncompliance date on 04/02/2022 for atrial fibrillation with RVR and acute on chronic CHF after presenting with shortness of breath, edema, and palpitations (heart racing).  Assessment & Plan    1.  Permanent atrial fibrillation: Currently on metoprolol and amiodarone.  Heart rates are better controlled.  Improved control could be due to diuresis.  He is not a good long-term amiodarone candidate as there is no options for rhythm control.  We Maniah Nading stop amiodarone today and see if heart rates remain well controlled.  Continue anticoagulation with Eliquis.  2.  Acute on chronic systolic heart failure: Ejection fraction 30 to 35%.  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  Heart rates are better controlled.  Did have some improvement.  He does remain volume overloaded, no creatinine has gone up.  We Ahmad Vanwey switch him to p.o. Lasix.  3.  Hypertension: Blood pressure well controlled  4.  CKD stage III: Creatinine has begun to rise.  We Valissa Lyvers switch to p.o. diuresis.  5.  Hypokalemia: Potassium 3.7.  Per primary team.   For questions or updates, please contact [facility] Please consult [email or web address] for contact info under        Signed, Gyanna Jarema Jorja Loa, MD  04/05/2022, 9:52 AM

## 2022-04-05 NOTE — Plan of Care (Signed)
  Problem: Education: Goal: Knowledge of disease or condition will improve Outcome: Progressing   Problem: Education: Goal: Understanding of medication regimen will improve Outcome: Progressing   Problem: Cardiac: Goal: Ability to achieve and maintain adequate cardiopulmonary perfusion will improve Outcome: Progressing   Problem: Health Behavior/Discharge Planning: Goal: Ability to safely manage health-related needs after discharge will improve Outcome: Progressing   Problem: Pain Managment: Goal: General experience of comfort will improve Outcome: Progressing   Problem: Safety: Goal: Ability to remain free from injury will improve Outcome: Progressing

## 2022-04-05 NOTE — Progress Notes (Signed)
PROGRESS NOTE        PATIENT DETAILS Name: Javier Berry Age: 61 y.o. Sex: male Date of Birth: [birth year] Admit Date: 04/02/2022 Admitting Physician Dewayne Shorter Levora Dredge, MD PCP:Pcp, No  Brief Summary: Patient is a 61 y.o.  male chronic HFpEF, CKD stage IIIa, HTN, persistent atrial fibrillation-who presented with A-fib RVR and HFrEF exacerbation.  Significant events: 7/5>> admit to [facility]-A-fib RVR and HFrEF exacerbation.  Significant studies: 7/5>> CXR: No PNA. 7/6>> Echo: EF 30-35%, RV systolic function mildly reduced.   Significant microbiology data: None  Procedures: None  Consults: Cardiology  Subjective: Feels better-significant less edema today.  Heart rate better controlled with amiodarone.  Objective: Vitals: Blood pressure (!) 137/96, pulse (!) 111, temperature 98.1 F (36.7 C), temperature source Oral, resp. rate 17, height 6' (1.829 m), weight 107 kg, SpO2 99 %.   Exam: Gen Exam:Alert awake-not in any distress HEENT:atraumatic, normocephalic Chest: B/L clear to auscultation anteriorly CVS:S1S2 irregular Abdomen:soft non tender, non distended Extremities: Trace edema Neurology: Non focal Skin: no rash    Pertinent Labs/Radiology:    Latest Ref Rng & Units 04/03/2022    5:01 AM 04/02/2022   11:16 AM 07/06/2018   11:52 AM  CBC  WBC 4.0 - 10.5 K/uL 5.6  5.7  5.2   Hemoglobin 13.0 - 17.0 g/dL 42.7  06.2  37.6   Hematocrit 39.0 - 52.0 % 38.0  41.2  43.4   Platelets 150 - 400 K/uL 161  167  163     Lab Results  Component Value Date   NA 141 04/05/2022   K 3.7 04/05/2022   CL 107 04/05/2022   CO2 26 04/05/2022      Assessment/Plan: Acute on chronic HFrEF: Volume status much better-creatinine mildly elevated-has been switched to oral diuretics.  Persistent A-fib with RVR: Heart rate better with IV amiodarone-cardiology stopping amiodarone-and seeing how he does.  Remains on metoprolol/digoxin and Eliquis.    HTN: BP  relatively stable-continue beta-blocker/diuretics  CKD stage IIIa: Slight bump in creatinine but still close to baseline-follow   Hypokalemia: Repleted  Gout: No evidence of flare-continue allopurinol.  Noncompliance to medications/follow-up: unfortunately lost to follow-up to cardiology since 2020-has been counseled.  Obesity: Estimated body mass index is 31.98 kg/m as calculated from the following:   Height as of this encounter: 6' (1.829 m).   Weight as of this encounter: 107 kg.   Code status:   Code Status: Full Code   DVT Prophylaxis: apixaban (ELIQUIS) tablet 5 mg     Family Communication: None at bedside   Disposition Plan: Status is: Observation The patient will require care spanning > 2 midnights and should be moved to inpatient because: Improving-stopping IV amiodarone today-observing   Planned Discharge Destination:Home possibly 7/9.   Diet: Diet Order             Diet Heart Room service appropriate? Yes; Fluid consistency: Thin  Diet effective now                     Antimicrobial agents: Anti-infectives (From admission, onward)    None        MEDICATIONS: Scheduled Meds:  allopurinol  100 mg Oral Daily   apixaban  5 mg Oral BID   digoxin  0.125 mg Oral Daily   furosemide  40 mg Oral BID   metoprolol tartrate  75 mg Oral TID   sodium chloride flush  3 mL Intravenous Q12H   Continuous Infusions:   PRN Meds:.acetaminophen **OR** acetaminophen, hydrALAZINE, ondansetron **OR** ondansetron (ZOFRAN) IV   I have personally reviewed following labs and imaging studies  LABORATORY DATA: CBC: Recent Labs  Lab 04/02/22 1116 04/03/22 0501  WBC 5.7 5.6  HGB 14.1 12.8*  HCT 41.2 38.0*  MCV 96.0 95.7  PLT 167 161     Basic Metabolic Panel: Recent Labs  Lab 04/02/22 1116 04/02/22 1149 04/03/22 0501 04/04/22 0312 04/05/22 0239  NA 142  --  146* 141 141  K 3.7  --  3.6 3.4* 3.7  CL 111  --  107 107 107  CO2 22  --  26 25 26    GLUCOSE 152*  --  98 119* 145*  BUN 16  --  14 14 16   CREATININE 1.51*  --  1.47* 1.54* 1.75*  CALCIUM 8.9  --  9.1 8.7* 8.6*  MG  --  1.9  --  1.8 2.2     GFR: Estimated Creatinine Clearance: 55.3 mL/min (A) (by C-G formula based on SCr of 1.75 mg/dL (H)).  Liver Function Tests: Recent Labs  Lab 04/02/22 1149  AST 28  ALT 16  ALKPHOS 57  BILITOT 1.9*  PROT 6.5  ALBUMIN 3.0*    No results for input(s): "LIPASE", "AMYLASE" in the last 168 hours. No results for input(s): "AMMONIA" in the last 168 hours.  Coagulation Profile: Recent Labs  Lab 04/02/22 1116  INR 1.3*     Cardiac Enzymes: No results for input(s): "CKTOTAL", "CKMB", "CKMBINDEX", "TROPONINI" in the last 168 hours.  BNP (last 3 results) No results for input(s): "PROBNP" in the last 8760 hours.  Lipid Profile: Recent Labs    04/02/22 2152  CHOL 121  HDL 24*  LDLCALC 83  TRIG 71  CHOLHDL 5.0     Thyroid Function Tests: Recent Labs    04/02/22 2152  TSH 1.733     Anemia Panel: No results for input(s): "VITAMINB12", "FOLATE", "FERRITIN", "TIBC", "IRON", "RETICCTPCT" in the last 72 hours.  Urine analysis:    Component Value Date/Time   COLORURINE YELLOW 10/31/2016 2040   APPEARANCEUR CLEAR 10/31/2016 2040   LABSPEC 1.021 10/31/2016 2040   PHURINE 5.0 10/31/2016 2040   GLUCOSEU NEGATIVE 10/31/2016 2040   HGBUR MODERATE (A) 10/31/2016 2040   BILIRUBINUR NEGATIVE 10/31/2016 2040   KETONESUR NEGATIVE 10/31/2016 2040   PROTEINUR 30 (A) 10/31/2016 2040   NITRITE NEGATIVE 10/31/2016 2040   LEUKOCYTESUR NEGATIVE 10/31/2016 2040    Sepsis Labs: Lactic Acid, Venous No results found for: "LATICACIDVEN"  MICROBIOLOGY: No results found for this or any previous visit (from the past 240 hour(s)).  RADIOLOGY STUDIES/RESULTS: No results found.   LOS: 2 days   2041, MD  [facility]    To contact the attending provider between 7A-7P or the covering provider during  after hours 7P-7A, please log into the web site [email or web address] and access using universal [place] password for that web site. If you do not have the password, please call the hospital operator.  04/05/2022, 10:53 AM

## 2022-04-06 DIAGNOSIS — I10 Essential (primary) hypertension: Secondary | ICD-10-CM | POA: Diagnosis not present

## 2022-04-06 DIAGNOSIS — N1831 Chronic kidney disease, stage 3a: Secondary | ICD-10-CM | POA: Diagnosis not present

## 2022-04-06 DIAGNOSIS — I5023 Acute on chronic systolic (congestive) heart failure: Secondary | ICD-10-CM | POA: Diagnosis not present

## 2022-04-06 DIAGNOSIS — I4891 Unspecified atrial fibrillation: Secondary | ICD-10-CM | POA: Diagnosis not present

## 2022-04-06 LAB — BASIC METABOLIC PANEL
Anion gap: 6 (ref 5–15)
BUN: 15 mg/dL (ref 8–23)
CO2: 28 mmol/L (ref 22–32)
Calcium: 8.7 mg/dL — ABNORMAL LOW (ref 8.9–10.3)
Chloride: 105 mmol/L (ref 98–111)
Creatinine, Ser: 1.64 mg/dL — ABNORMAL HIGH (ref 0.61–1.24)
GFR, Estimated: 47 mL/min — ABNORMAL LOW (ref >60)
Glucose, Bld: 131 mg/dL — ABNORMAL HIGH (ref 70–99)
Potassium: 3.5 mmol/L (ref 3.5–5.1)
Sodium: 139 mmol/L (ref 135–145)

## 2022-04-06 MED ORDER — ELIQUIS 5 MG PO TABS: 5.0000 mg | ORAL_TABLET | Freq: Two times a day (BID) | ORAL | 2 refills | Status: DC

## 2022-04-06 MED ORDER — FUROSEMIDE 40 MG PO TABS: 40.0000 mg | ORAL_TABLET | Freq: Two times a day (BID) | ORAL | 2 refills | Status: DC

## 2022-04-06 MED ORDER — ALLOPURINOL 100 MG PO TABS: 100.0000 mg | ORAL_TABLET | Freq: Every day | ORAL | 2 refills | Status: DC

## 2022-04-06 MED ORDER — METOPROLOL SUCCINATE ER 100 MG PO TB24: 100.0000 mg | ORAL_TABLET | Freq: Every day | ORAL | 2 refills | Status: DC

## 2022-04-06 MED ORDER — FLUTICASONE FUROATE-VILANTEROL 100-25 MCG/ACT IN AEPB: 1.0000 | INHALATION_SPRAY | Freq: Every day | RESPIRATORY_TRACT | 2 refills | Status: DC | PRN

## 2022-04-06 MED ORDER — DIGOXIN 125 MCG PO TABS: 0.1250 mg | ORAL_TABLET | Freq: Every day | ORAL | 2 refills | Status: DC

## 2022-04-06 MED ORDER — METOPROLOL SUCCINATE ER 100 MG PO TB24: 100.0000 mg | ORAL_TABLET | Freq: Two times a day (BID) | ORAL | 2 refills | Status: DC

## 2022-04-06 NOTE — Progress Notes (Signed)
Progress Note  Patient Name: Javier Berry Date of Encounter: 04/06/2022  [facility] Cardiologist: [name], MD   Subjective   Feeling improved.  Heart rates are better controlled.  Amiodarone has been stopped.  He continues to have lower extremity edema, but otherwise has no major complaints.   Inpatient Medications    Scheduled Meds:  allopurinol  100 mg Oral Daily   apixaban  5 mg Oral BID   digoxin  0.125 mg Oral Daily   furosemide  40 mg Oral BID   metoprolol tartrate  75 mg Oral TID   sodium chloride flush  3 mL Intravenous Q12H   Continuous Infusions:   PRN Meds: acetaminophen **OR** acetaminophen, hydrALAZINE, ondansetron **OR** ondansetron (ZOFRAN) IV, traMADol   Vital Signs    Vitals:   04/05/22 1945 04/05/22 2117 04/05/22 2323 04/06/22 0500  BP: (!) 146/106 127/90 (!) 140/98   Pulse: 79  79 79  Resp: 19  19 19   Temp: 97.7 F (36.5 C)  97.8 F (36.6 C) 97.9 F (36.6 C)  TempSrc: Oral  Oral Oral  SpO2: 97%  98% 96%  Weight:    105.7 kg  Height:        Intake/Output Summary (Last 24 hours) at 04/06/2022 0810 Last data filed at 04/06/2022 06/07/2022 Gross per 24 hour  Intake 816.36 ml  Output 3475 ml  Net -2658.64 ml       04/06/2022    5:00 AM 04/05/2022    4:59 AM 04/04/2022    4:00 AM  Last 3 Weights  Weight (lbs) 233 lb 1.6 oz 235 lb 12.8 oz 234 lb  Weight (kg) 105.733 kg 106.958 kg 106.142 kg      Telemetry    Atrial fibrillation-personally reviewed  ECG    None new  Physical Exam   GEN: Well nourished, well developed, in no acute distress  HEENT: normal  Neck: no JVD, carotid bruits, or masses Cardiac: irregular; no murmurs, rubs, or gallops,1+ edema  Respiratory:  clear to auscultation bilaterally, normal work of breathing GI: soft, nontender, nondistended, + BS MS: no deformity or atrophy  Skin: warm and dry Neuro:  Strength and sensation are intact Psych: euthymic mood, full affect   Labs    High Sensitivity Troponin:   No results for input(s): "TROPONINIHS" in the last 720 hours.   Chemistry Recent Labs  Lab 04/02/22 1149 04/03/22 0501 04/04/22 0312 04/05/22 0239 04/06/22 0113  NA  --    < > 141 141 139  K  --    < > 3.4* 3.7 3.5  CL  --    < > 107 107 105  CO2  --    < > 25 26 28   GLUCOSE  --    < > 119* 145* 131*  BUN  --    < > 14 16 15   CREATININE  --    < > 1.54* 1.75* 1.64*  CALCIUM  --    < > 8.7* 8.6* 8.7*  MG 1.9  --  1.8 2.2  --   PROT 6.5  --   --   --   --   ALBUMIN 3.0*  --   --   --   --   AST 28  --   --   --   --   ALT 16  --   --   --   --   ALKPHOS 57  --   --   --   --  BILITOT 1.9*  --   --   --   --   GFRNONAA  --    < > 51* 43* 47*  ANIONGAP  --    < > 9 8 6    < > = values in this interval not displayed.     Lipids  Recent Labs  Lab 04/02/22 2152  CHOL 121  TRIG 71  HDL 24*  LDLCALC 83  CHOLHDL 5.0     Hematology Recent Labs  Lab 04/02/22 1116 04/03/22 0501  WBC 5.7 5.6  RBC 4.29 3.97*  HGB 14.1 12.8*  HCT 41.2 38.0*  MCV 96.0 95.7  MCH 32.9 32.2  MCHC 34.2 33.7  RDW 15.3 15.1  PLT 167 161    Thyroid  Recent Labs  Lab 04/02/22 2152  TSH 1.733     BNP Recent Labs  Lab 04/02/22 1149  BNP 444.1*     DDimer No results for input(s): "DDIMER" in the last 168 hours.   Radiology    No results found.  Cardiac Studies   Echocardiogram 04/03/2022: Impressions:  1. Left ventricular ejection fraction, by estimation, is 30 to 35%. The  left ventricle has moderately decreased function. The left ventricle  demonstrates global hypokinesis. There is mild left ventricular  hypertrophy. Left ventricular diastolic  parameters are indeterminate.   2. Right ventricular systolic function is mildly reduced. The right  ventricular size is mildly enlarged.   3. Left atrial size was severely dilated.   4. Right atrial size was severely dilated.   5. The mitral valve is normal in structure. Trivial mitral valve  regurgitation. No evidence of mitral  stenosis.   6. Tricuspid valve regurgitation is mild to moderate.   7. The aortic valve is normal in structure. Aortic valve regurgitation is  not visualized. No aortic stenosis is present.   8. The inferior vena cava is dilated in size with <50% respiratory  variability, suggesting right atrial pressure of 15 mmHg.   Comparison(s): Prior images reviewed side by side. The left ventricular  function is worsened. ECHO 2020 - EF 55-60%.    Patient Profile     61 y.o. male with a history of nonischemic cardiomyopathy/ chronic systolic CHF with elevation of EF Echo in 2020, permanent atrial fibrillation on Eliquis, hypertension, CKD stage III, gout, PUD, and medication noncompliance date on 04/02/2022 for atrial fibrillation with RVR and acute on chronic CHF after presenting with shortness of breath, edema, and palpitations (heart racing).  Assessment & Plan    1.  Permanent atrial fibrillation: Currently on metoprolol 75 mg 3 times daily.  Would stop this at discharge and plan for Toprol-XL 100 mg twice daily.  Continue anticoagulation with Eliquis.  2.  Acute on chronic systolic heart failure: Ejection fraction 30 to 35%.  Potentially a tachycardia mediated cardiomyopathy.  Heart rates are better controlled.  We Esiquio Boesen plan for discharge today with follow-up in cardiology clinic.  Should be discharged on Lasix 40 mg twice daily for the next week followed by 40 mg daily.  We Arlett Goold arrange for follow-up in cardiology clinic.  3.  Hypertension: Currently well controlled  4.  CKD stage III: Creatinine has remained stable.  Continue p.o. diuresis.  5.  Hypokalemia: Potassium 3.7.  Jatavius Ellenwood likely need potassium at home with diuresis.  Would check a BMET in 1 week.   For questions or updates, please contact [facility] Please consult [email or web address] for contact info under        Signed, Shareece Bultman  Meredith Leeds, MD  04/06/2022, 8:10 AM

## 2022-04-06 NOTE — Discharge Summary (Addendum)
PATIENT DETAILS Name: Javier Berry Age: 61 y.o. Sex: male Date of Birth: 1961-09-26 MRN: [id number]. Admitting Physician: Jonetta Osgood, MD PCP:Pcp, No  Admit Date: 04/02/2022 Discharge date: 04/06/2022  Recommendations for Outpatient Follow-up:  Follow up with PCP in 1-2 weeks Please obtain CMP/CBC in one week Please ensure follow-up with cardiology Please repeat digoxin next week at cardiology office.   Admitted From:  Home  Disposition: Home   Discharge Condition: good  CODE STATUS:   Code Status: Full Code   Diet recommendation:  Diet Order             Diet - low sodium heart healthy           Diet Heart Room service appropriate? Yes; Fluid consistency: Thin  Diet effective now                    Brief Summary: Patient is a 61 y.o.  male chronic HFpEF, CKD stage IIIa, HTN, persistent atrial fibrillation-who presented with A-fib RVR and HFrEF exacerbation.   Significant events: 7/5>> admit to [facility]-A-fib RVR and HFrEF exacerbation.   Significant studies: 7/5>> CXR: No PNA. 7/6>> Echo: EF [id number], RV systolic function mildly reduced.     Significant microbiology data: None   Procedures: None   Consults: Cardiology  Brief Hospital Course: Acute on chronic HFrEF: Volume status much better-treated with IV Lasix-switch to oral Lasix yesterday-volume status remains stable-stable for discharge-we will be on a beta-blocker.  Given AKI/CKD-avoiding ARB/ACEI/Entresto for now-can be reevaluated at cardiology clinic and started on these medications accordingly.  Persistent A-fib with RVR: Difficult to rate control-initially on Cardizem infusion-subsequently switched to amiodarone infusion-thankfully heart rate now much better-has been off amiodarone infusion since yesterday and rate adequately controlled-cardiology recommending continuing metoprolol and digoxin on discharge.    HTN: BP relatively stable-continue beta-blocker/diuretics   CKD stage IIIa:  Creatinine relatively stable-continue to follow electrolytes closely.   Hypokalemia: Repleted   Gout: No evidence of flare-continue allopurinol.   Noncompliance to medications/follow-up: unfortunately lost to follow-up to cardiology since 2020-has been counseled.   Obesity: Estimated body mass index is 31.61 kg/m as calculated from the following:   Height as of this encounter: 6' (1.829 m).   Weight as of this encounter: 105.7 kg.   Discharge Diagnoses:  Principal Problem:   Atrial fibrillation with RVR (HCC) Active Problems:   Essential hypertension   CKD (chronic kidney disease) stage 3, GFR 30-59 ml/min (HCC)   Acute on chronic systolic CHF (congestive heart failure) (HCC)   CHF exacerbation (HCC)   Discharge Instructions:  Activity:  As tolerated   Discharge Instructions     (HEART FAILURE PATIENTS) Call MD:  Anytime you have any of the following symptoms: 1) 3 pound weight gain in 24 hours or 5 pounds in 1 week 2) shortness of breath, with or without a dry hacking cough 3) swelling in the hands, feet or stomach 4) if you have to sleep on extra pillows at night in order to breathe.   Complete by: As directed    Amb referral to [facility] Clinic   Complete by: As directed    Diet - low sodium heart healthy   Complete by: As directed    Discharge instructions   Complete by: As directed    Follow with Primary MD  in 1-2 weeks  Please follow-up with cardiology-their office will give you a call for follow-up.  Please get a complete blood count and chemistry panel checked by  your Primary MD at your next visit, and again as instructed by your Primary MD.  Get Medicines reviewed and adjusted: Please take all your medications with you for your next visit with your Primary MD  Laboratory/radiological data: Please request your Primary MD to go over all hospital tests and procedure/radiological results at the follow up, please ask your Primary MD to get all Hospital records sent to  his/her office.  In some cases, they will be blood work, cultures and biopsy results pending at the time of your discharge. Please request that your primary care M.D. follows up on these results.  Also Note the following: If you experience worsening of your admission symptoms, develop shortness of breath, life threatening emergency, suicidal or homicidal thoughts you must seek medical attention immediately by calling 911 or calling your MD immediately  if symptoms less severe.  You must read complete instructions/literature along with all the possible adverse reactions/side effects for all the Medicines you take and that have been prescribed to you. Take any new Medicines after you have completely understood and accpet all the possible adverse reactions/side effects.   Do not drive when taking Pain medications or sleeping medications (Benzodaizepines)  Do not take more than prescribed Pain, Sleep and Anxiety Medications. It is not advisable to combine anxiety,sleep and pain medications without talking with your primary care practitioner  Special Instructions: If you have smoked or chewed Tobacco  in the last 2 yrs please stop smoking, stop any regular Alcohol  and or any Recreational drug use.  Wear Seat belts while driving.  Please note: You were cared for by a hospitalist during your hospital stay. Once you are discharged, your primary care physician will handle any further medical issues. Please note that NO REFILLS for any discharge medications will be authorized once you are discharged, as it is imperative that you return to your primary care physician (or establish a relationship with a primary care physician if you do not have one) for your post hospital discharge needs so that they can reassess your need for medications and monitor your lab values.   Increase activity slowly   Complete by: As directed       Allergies as of 04/06/2022   No Known Allergies      Medication List      STOP taking these medications    amLODipine 5 MG tablet Commonly known as: NORVASC   ibuprofen 200 MG tablet Commonly known as: ADVIL   lisinopril 20 MG tablet Commonly known as: ZESTRIL       TAKE these medications    acetaminophen 325 MG tablet Commonly known as: TYLENOL Take 650 mg by mouth every 6 (six) hours as needed for mild pain.   allopurinol 100 MG tablet Commonly known as: ZYLOPRIM Take 1 tablet (100 mg total) by mouth daily.   digoxin 0.125 MG tablet Commonly known as: LANOXIN Take 1 tablet (0.125 mg total) by mouth daily. Start taking on: April 07, 2022   Eliquis 5 MG Tabs tablet Generic drug: apixaban Take 1 tablet (5 mg total) by mouth 2 (two) times daily. TAKE 1 TABLET BY MOUTH 2 TIMES DAILY pt needs appointment and labs What changed:  how much to take how to take this when to take this   fluticasone furoate-vilanterol 100-25 MCG/ACT Aepb Commonly known as: Breo Ellipta Inhale 1 puff into the lungs daily as needed (shortness of breath).   furosemide 40 MG tablet Commonly known as: LASIX Take 1 tablet (40 mg  total) by mouth 2 (two) times daily. What changed: when to take this   metoprolol succinate 100 MG 24 hr tablet Commonly known as: TOPROL-XL Take 1 tablet (100 mg total) by mouth 2 (two) times daily. What changed:  medication strength how much to take when to take this        Follow-up Information     Azalee Course, [place] Follow up on 04/09/2022.   Specialties: Cardiology, Radiology Why: @8 :25am for hospital follow up Contact information: [address] [place] [phone]                No Known Allergies   Other Procedures/Studies: ECHOCARDIOGRAM COMPLETE  Result Date: 04/03/2022    ECHOCARDIOGRAM REPORT   Patient Name:   Javier Berry Date of Exam: 04/03/2022 Medical Rec #:  06/04/2022      Height:       72.0 in Accession #:    [id number]     Weight:       239.0 lb Date of Birth:  02-16-1961       BSA:          2.298 m Patient Age:    60 years       BP:           93/55 mmHg Patient Gender: M              HR:           113 bpm. Exam Location:  Inpatient Procedure: 2D Echo, Cardiac Doppler and Color Doppler Indications:    CHF-Acute Systolic I50.21  History:        Patient has prior history of Echocardiogram examinations, most                 recent 04/26/2019. Cardiomyopathy and CHF, Arrythmias:Atrial                 Fibrillation; Risk Factors:Hypertension.  Sonographer:    04/28/2019 [facility] Referring Phys: [id number] JENNIFER YATES IMPRESSIONS  1. Left ventricular ejection fraction, by estimation, is 30 to 35%. The left ventricle has moderately decreased function. The left ventricle demonstrates global hypokinesis. There is mild left ventricular hypertrophy. Left ventricular diastolic parameters are indeterminate.  2. Right ventricular systolic function is mildly reduced. The right ventricular size is mildly enlarged.  3. Left atrial size was severely dilated.  4. Right atrial size was severely dilated.  5. The mitral valve is normal in structure. Trivial mitral valve regurgitation. No evidence of mitral stenosis.  6. Tricuspid valve regurgitation is mild to moderate.  7. The aortic valve is normal in structure. Aortic valve regurgitation is not visualized. No aortic stenosis is present.  8. The inferior vena cava is dilated in size with <50% respiratory variability, suggesting right atrial pressure of 15 mmHg. Comparison(s): Prior images reviewed side by side. The left ventricular function is worsened. ECHO 2020 - EF 55-60%. FINDINGS  Left Ventricle: Left ventricular ejection fraction, by estimation, is 30 to 35%. The left ventricle has moderately decreased function. The left ventricle demonstrates global hypokinesis. The left ventricular internal cavity size was normal in size. There is mild left ventricular hypertrophy. Left ventricular diastolic parameters are indeterminate.  LV Wall Scoring: The inferior  septum is akinetic. Right Ventricle: The right ventricular size is mildly enlarged. No increase in right ventricular wall thickness. Right ventricular systolic function is mildly reduced. Left Atrium: Left atrial size was severely dilated. Right Atrium: Right atrial size was severely dilated. Pericardium:  There is no evidence of pericardial effusion. Mitral Valve: The mitral valve is normal in structure. Trivial mitral valve regurgitation. No evidence of mitral valve stenosis. Tricuspid Valve: The tricuspid valve is normal in structure. Tricuspid valve regurgitation is mild to moderate. No evidence of tricuspid stenosis. Aortic Valve: The aortic valve is normal in structure. Aortic valve regurgitation is not visualized. No aortic stenosis is present. Pulmonic Valve: The pulmonic valve was normal in structure. Pulmonic valve regurgitation is not visualized. No evidence of pulmonic stenosis. Aorta: The aortic root is normal in size and structure. Venous: The inferior vena cava is dilated in size with less than 50% respiratory variability, suggesting right atrial pressure of 15 mmHg. IAS/Shunts: No atrial level shunt detected by color flow Doppler.  LEFT VENTRICLE PLAX 2D LVIDd:         5.00 cm LVIDs:         4.10 cm LV PW:         1.40 cm LV IVS:        1.00 cm LVOT diam:     1.90 cm LV SV:         40 LV SV Index:   17 LVOT Area:     2.84 cm  LV Volumes (MOD) LV vol d, MOD A2C: 129.0 ml LV vol d, MOD A4C: 143.0 ml LV vol s, MOD A2C: 76.7 ml LV vol s, MOD A4C: 84.1 ml LV SV MOD A2C:     52.3 ml LV SV MOD A4C:     143.0 ml LV SV MOD BP:      52.0 ml RIGHT VENTRICLE TAPSE (M-mode): 1.3 cm LEFT ATRIUM            Index        RIGHT ATRIUM           Index LA diam:      5.30 cm  2.31 cm/m   RA Area:     44.50 cm LA Vol (A2C): 126.0 ml 54.83 ml/m  RA Volume:   200.00 ml 87.03 ml/m LA Vol (A4C): 122.0 ml 53.09 ml/m  AORTIC VALVE LVOT Vmax:   84.10 cm/s LVOT Vmean:  55.700 cm/s LVOT VTI:    0.141 m  AORTA Ao Root diam:  3.00 cm Ao Asc diam:  3.30 cm MR Peak grad:    61.5 mmHg    TRICUSPID VALVE MR Mean grad:    43.0 mmHg    TR Peak grad:   28.7 mmHg MR Vmax:         392.00 cm/s  TR Vmax:        268.00 cm/s MR Vmean:        310.0 cm/s MR PISA:         0.57 cm     SHUNTS MR PISA Eff ROA: 6 mm        Systemic VTI:  0.14 m MR PISA Radius:  0.30 cm      Systemic Diam: 1.90 cm Candee Furbish MD Electronically signed by Candee Furbish MD Signature Date/Time: 04/03/2022/11:25:34 AM    Final    DG Chest Port 1 View  Result Date: 04/02/2022 CLINICAL DATA:  Dyspnea, shortness of breath. EXAM: PORTABLE CHEST 1 VIEW COMPARISON:  Chest radiograph dated 11/06/2016. FINDINGS: The heart size is enlarged. Both lungs are clear. The visualized skeletal structures are unremarkable. IMPRESSION: Cardiomegaly.  No active disease. Electronically Signed   By: Zerita Boers M.D.   On: 04/02/2022 11:46     TODAY-DAY OF  DISCHARGE:  Subjective:   Javier Berry today has no headache,no chest abdominal pain,no new weakness tingling or numbness, feels much better wants to go home today.   Objective:   Blood pressure (!) 136/105, pulse 86, temperature 97.9 F (36.6 C), temperature source Oral, resp. rate 18, height 6' (1.829 m), weight 105.7 kg, SpO2 97 %.  Intake/Output Summary (Last 24 hours) at 04/06/2022 1003 Last data filed at 04/06/2022 0800 Gross per 24 hour  Intake 1056.36 ml  Output 3675 ml  Net -2618.64 ml   Filed Weights   04/04/22 0400 04/05/22 0459 04/06/22 0500  Weight: 106.1 kg 107 kg 105.7 kg    Exam: Awake Alert, Oriented *3, No new F.N deficits, Normal affect .AT,PERRAL Supple Neck,No JVD, No cervical lymphadenopathy appriciated.  Symmetrical Chest wall movement, Good air movement bilaterally, CTAB RRR,No Gallops,Rubs or new Murmurs, No Parasternal Heave +ve B.Sounds, Abd Soft, Non tender, No organomegaly appriciated, No rebound -guarding or rigidity. No Cyanosis, Clubbing or edema, No new Rash or bruise   PERTINENT  RADIOLOGIC STUDIES: No results found.   PERTINENT LAB RESULTS: CBC: No results for input(s): "WBC", "HGB", "HCT", "PLT" in the last 72 hours. CMET CMP     Component Value Date/Time   NA 139 04/06/2022 0113   NA 145 (H) 07/06/2018 1152   K 3.5 04/06/2022 0113   CL 105 04/06/2022 0113   CO2 28 04/06/2022 0113   GLUCOSE 131 (H) 04/06/2022 0113   BUN 15 04/06/2022 0113   BUN 14 07/06/2018 1152   CREATININE 1.64 (H) 04/06/2022 0113   CREATININE 1.54 (H) 12/11/2016 1150   CALCIUM 8.7 (L) 04/06/2022 0113   PROT 6.5 04/02/2022 1149   ALBUMIN 3.0 (L) 04/02/2022 1149   AST 28 04/02/2022 1149   ALT 16 04/02/2022 1149   ALKPHOS 57 04/02/2022 1149   BILITOT 1.9 (H) 04/02/2022 1149   GFRNONAA 47 (L) 04/06/2022 0113   GFRAA 66 07/06/2018 1152    GFR Estimated Creatinine Clearance: 58.7 mL/min (A) (by C-G formula based on SCr of 1.64 mg/dL (H)). No results for input(s): "LIPASE", "AMYLASE" in the last 72 hours. No results for input(s): "CKTOTAL", "CKMB", "CKMBINDEX", "TROPONINI" in the last 72 hours. Invalid input(s): "POCBNP" No results for input(s): "DDIMER" in the last 72 hours. No results for input(s): "HGBA1C" in the last 72 hours. No results for input(s): "CHOL", "HDL", "LDLCALC", "TRIG", "CHOLHDL", "LDLDIRECT" in the last 72 hours. No results for input(s): "TSH", "T4TOTAL", "T3FREE", "THYROIDAB" in the last 72 hours.  Invalid input(s): "FREET3" No results for input(s): "VITAMINB12", "FOLATE", "FERRITIN", "TIBC", "IRON", "RETICCTPCT" in the last 72 hours. Coags: No results for input(s): "INR" in the last 72 hours.  Invalid input(s): "PT" Microbiology: No results found for this or any previous visit (from the past 240 hour(s)).  FURTHER DISCHARGE INSTRUCTIONS:  Get Medicines reviewed and adjusted: Please take all your medications with you for your next visit with your Primary MD  Laboratory/radiological data: Please request your Primary MD to go over all hospital tests  and procedure/radiological results at the follow up, please ask your Primary MD to get all Hospital records sent to his/her office.  In some cases, they will be blood work, cultures and biopsy results pending at the time of your discharge. Please request that your primary care M.D. goes through all the records of your hospital data and follows up on these results.  Also Note the following: If you experience worsening of your admission symptoms, develop shortness of breath, life threatening emergency,  suicidal or homicidal thoughts you must seek medical attention immediately by calling 911 or calling your MD immediately  if symptoms less severe.  You must read complete instructions/literature along with all the possible adverse reactions/side effects for all the Medicines you take and that have been prescribed to you. Take any new Medicines after you have completely understood and accpet all the possible adverse reactions/side effects.   Do not drive when taking Pain medications or sleeping medications (Benzodaizepines)  Do not take more than prescribed Pain, Sleep and Anxiety Medications. It is not advisable to combine anxiety,sleep and pain medications without talking with your primary care practitioner  Special Instructions: If you have smoked or chewed Tobacco  in the last 2 yrs please stop smoking, stop any regular Alcohol  and or any Recreational drug use.  Wear Seat belts while driving.  Please note: You were cared for by a hospitalist during your hospital stay. Once you are discharged, your primary care physician will handle any further medical issues. Please note that NO REFILLS for any discharge medications will be authorized once you are discharged, as it is imperative that you return to your primary care physician (or establish a relationship with a primary care physician if you do not have one) for your post hospital discharge needs so that they can reassess your need for medications  and monitor your lab values.  Total Time spent coordinating discharge including counseling, education and face to face time equals greater than 30 minutes.  Signed: Kourtland Coopman 04/06/2022 10:03 AM

## 2022-04-09 ENCOUNTER — Encounter: Payer: BC Managed Care – PPO | Admitting: Physician Assistant

## 2022-04-11 NOTE — Progress Notes (Signed)
This encounter was created in error - please disregard.

## 2022-04-14 ENCOUNTER — Encounter: Payer: Self-pay | Admitting: Physician Assistant

## 2022-05-02 ENCOUNTER — Ambulatory Visit: Payer: BC Managed Care – PPO | Admitting: Adult Health

## 2022-12-07 ENCOUNTER — Inpatient Hospital Stay (HOSPITAL_COMMUNITY)
Admission: EM | Admit: 2022-12-07 | Discharge: 2022-12-13 | DRG: 291 | Disposition: A | Discharge status: Home Health | Payer: BC Managed Care – PPO | Attending: Family Medicine | Admitting: Family Medicine

## 2022-12-07 ENCOUNTER — Other Ambulatory Visit: Payer: Self-pay

## 2022-12-07 ENCOUNTER — Emergency Department (HOSPITAL_COMMUNITY): Payer: BC Managed Care – PPO

## 2022-12-07 DIAGNOSIS — R000 Tachycardia, unspecified: Secondary | ICD-10-CM | POA: Diagnosis present

## 2022-12-07 DIAGNOSIS — I4891 Unspecified atrial fibrillation: Secondary | ICD-10-CM | POA: Diagnosis not present

## 2022-12-07 DIAGNOSIS — R0602 Shortness of breath: Secondary | ICD-10-CM

## 2022-12-07 DIAGNOSIS — K30 Functional dyspepsia: Secondary | ICD-10-CM

## 2022-12-07 DIAGNOSIS — Z7901 Long term (current) use of anticoagulants: Secondary | ICD-10-CM

## 2022-12-07 DIAGNOSIS — I10 Essential (primary) hypertension: Secondary | ICD-10-CM | POA: Diagnosis present

## 2022-12-07 DIAGNOSIS — I130 Hypertensive heart and chronic kidney disease with heart failure and stage 1 through stage 4 chronic kidney disease, or unspecified chronic kidney disease: Principal | ICD-10-CM | POA: Diagnosis present

## 2022-12-07 DIAGNOSIS — I509 Heart failure, unspecified: Secondary | ICD-10-CM | POA: Diagnosis not present

## 2022-12-07 DIAGNOSIS — Z823 Family history of stroke: Secondary | ICD-10-CM

## 2022-12-07 DIAGNOSIS — Z833 Family history of diabetes mellitus: Secondary | ICD-10-CM

## 2022-12-07 DIAGNOSIS — Z23 Encounter for immunization: Secondary | ICD-10-CM

## 2022-12-07 DIAGNOSIS — Z813 Family history of other psychoactive substance abuse and dependence: Secondary | ICD-10-CM

## 2022-12-07 DIAGNOSIS — I428 Other cardiomyopathies: Secondary | ICD-10-CM | POA: Diagnosis present

## 2022-12-07 DIAGNOSIS — I5023 Acute on chronic systolic (congestive) heart failure: Secondary | ICD-10-CM | POA: Diagnosis present

## 2022-12-07 DIAGNOSIS — Z8249 Family history of ischemic heart disease and other diseases of the circulatory system: Secondary | ICD-10-CM

## 2022-12-07 DIAGNOSIS — N183 Chronic kidney disease, stage 3 unspecified: Secondary | ICD-10-CM | POA: Diagnosis present

## 2022-12-07 DIAGNOSIS — I480 Paroxysmal atrial fibrillation: Secondary | ICD-10-CM | POA: Diagnosis present

## 2022-12-07 DIAGNOSIS — E876 Hypokalemia: Secondary | ICD-10-CM | POA: Diagnosis present

## 2022-12-07 DIAGNOSIS — D72828 Other elevated white blood cell count: Secondary | ICD-10-CM | POA: Diagnosis present

## 2022-12-07 DIAGNOSIS — Z79899 Other long term (current) drug therapy: Secondary | ICD-10-CM

## 2022-12-07 DIAGNOSIS — R509 Fever, unspecified: Secondary | ICD-10-CM | POA: Insufficient documentation

## 2022-12-07 DIAGNOSIS — I4821 Permanent atrial fibrillation: Secondary | ICD-10-CM | POA: Diagnosis present

## 2022-12-07 DIAGNOSIS — I5022 Chronic systolic (congestive) heart failure: Secondary | ICD-10-CM | POA: Diagnosis present

## 2022-12-07 DIAGNOSIS — M109 Gout, unspecified: Secondary | ICD-10-CM | POA: Diagnosis present

## 2022-12-07 DIAGNOSIS — N1831 Chronic kidney disease, stage 3a: Secondary | ICD-10-CM | POA: Diagnosis present

## 2022-12-07 DIAGNOSIS — J4489 Other specified chronic obstructive pulmonary disease: Secondary | ICD-10-CM | POA: Diagnosis present

## 2022-12-07 LAB — CBC WITH DIFFERENTIAL/PLATELET
Abs Immature Granulocytes: 0.05 10*3/uL (ref 0.00–0.07)
Basophils Absolute: 0.1 10*3/uL (ref 0.0–0.1)
Basophils Relative: 1 %
Eosinophils Absolute: 0.2 10*3/uL (ref 0.0–0.5)
Eosinophils Relative: 2 %
HCT: 39.5 % (ref 39.0–52.0)
Hemoglobin: 13.3 g/dL (ref 13.0–17.0)
Immature Granulocytes: 1 %
Lymphocytes Relative: 10 %
Lymphs Abs: 1.1 10*3/uL (ref 0.7–4.0)
MCH: 32.3 pg (ref 26.0–34.0)
MCHC: 33.7 g/dL (ref 30.0–36.0)
MCV: 95.9 fL (ref 80.0–100.0)
Monocytes Absolute: 0.9 10*3/uL (ref 0.1–1.0)
Monocytes Relative: 8 %
Neutro Abs: 8.4 10*3/uL — ABNORMAL HIGH (ref 1.7–7.7)
Neutrophils Relative %: 78 %
Platelets: 143 10*3/uL — ABNORMAL LOW (ref 150–400)
RBC: 4.12 MIL/uL — ABNORMAL LOW (ref 4.22–5.81)
RDW: 15.3 % (ref 11.5–15.5)
WBC: 10.7 10*3/uL — ABNORMAL HIGH (ref 4.0–10.5)
nRBC: 0 % (ref 0.0–0.2)

## 2022-12-07 LAB — BASIC METABOLIC PANEL
Anion gap: 8 (ref 5–15)
BUN: 15 mg/dL (ref 8–23)
CO2: 24 mmol/L (ref 22–32)
Calcium: 8.4 mg/dL — ABNORMAL LOW (ref 8.9–10.3)
Chloride: 105 mmol/L (ref 98–111)
Creatinine, Ser: 1.29 mg/dL — ABNORMAL HIGH (ref 0.61–1.24)
GFR, Estimated: >60 mL/min (ref >60)
Glucose, Bld: 115 mg/dL — ABNORMAL HIGH (ref 70–99)
Potassium: 3.1 mmol/L — ABNORMAL LOW (ref 3.5–5.1)
Sodium: 137 mmol/L (ref 135–145)

## 2022-12-07 LAB — TROPONIN I (HIGH SENSITIVITY)
Troponin I (High Sensitivity): 15 ng/L (ref <18)
Troponin I (High Sensitivity): 18 ng/L — ABNORMAL HIGH (ref <18)

## 2022-12-07 LAB — MAGNESIUM: Magnesium: 1.9 mg/dL (ref 1.7–2.4)

## 2022-12-07 LAB — BRAIN NATRIURETIC PEPTIDE: B Natriuretic Peptide: 231 pg/mL — ABNORMAL HIGH (ref 0.0–100.0)

## 2022-12-07 MED ORDER — ACETAMINOPHEN 325 MG PO TABS
650.0000 mg | ORAL_TABLET | Freq: Four times a day (QID) | ORAL | Status: DC | PRN
  Administered 2022-12-07 – 2022-12-12 (×11): 650 mg via ORAL
  Filled 2022-12-07 (×11): qty 2

## 2022-12-07 MED ORDER — IPRATROPIUM-ALBUTEROL 0.5-2.5 (3) MG/3ML IN SOLN
3.0000 mL | Freq: Four times a day (QID) | RESPIRATORY_TRACT | Status: DC
  Administered 2022-12-07 – 2022-12-08 (×3): 3 mL via RESPIRATORY_TRACT
  Filled 2022-12-07 (×2): qty 3

## 2022-12-07 MED ORDER — ACETAMINOPHEN 650 MG RE SUPP: 650.0000 mg | Freq: Four times a day (QID) | RECTAL | Status: DC | PRN

## 2022-12-07 MED ORDER — METOPROLOL SUCCINATE ER 100 MG PO TB24: 100.0000 mg | ORAL_TABLET | Freq: Two times a day (BID) | ORAL | Status: DC

## 2022-12-07 MED ORDER — FUROSEMIDE 10 MG/ML IJ SOLN
40.0000 mg | Freq: Once | INTRAMUSCULAR | Status: AC
  Administered 2022-12-07: 40 mg via INTRAVENOUS
  Filled 2022-12-07: qty 4

## 2022-12-07 MED ORDER — APIXABAN 5 MG PO TABS
5.0000 mg | ORAL_TABLET | Freq: Two times a day (BID) | ORAL | Status: DC
  Administered 2022-12-07 – 2022-12-13 (×12): 5 mg via ORAL
  Filled 2022-12-07 (×12): qty 1

## 2022-12-07 MED ORDER — INFLUENZA VAC SPLIT QUAD 0.5 ML IM SUSY: 0.5000 mL | PREFILLED_SYRINGE | INTRAMUSCULAR | Status: DC

## 2022-12-07 MED ORDER — METOPROLOL TARTRATE 25 MG PO TABS
50.0000 mg | ORAL_TABLET | Freq: Once | ORAL | Status: AC
Start: 2022-12-07 — End: 2022-12-07
  Administered 2022-12-07: 50 mg via ORAL
  Filled 2022-12-07: qty 2

## 2022-12-07 MED ORDER — FLUTICASONE FUROATE-VILANTEROL 100-25 MCG/ACT IN AEPB: 1.0000 | INHALATION_SPRAY | Freq: Every day | RESPIRATORY_TRACT | Status: DC | PRN

## 2022-12-07 MED ORDER — POTASSIUM CHLORIDE CRYS ER 20 MEQ PO TBCR
40.0000 meq | EXTENDED_RELEASE_TABLET | Freq: Once | ORAL | Status: AC
  Administered 2022-12-07: 40 meq via ORAL
  Filled 2022-12-07: qty 2

## 2022-12-07 MED ORDER — DIGOXIN 0.25 MG/ML IJ SOLN
0.2500 mg | Freq: Once | INTRAMUSCULAR | Status: AC
  Administered 2022-12-07: 0.25 mg via INTRAVENOUS
  Filled 2022-12-07: qty 2

## 2022-12-07 MED ORDER — FENTANYL CITRATE PF 50 MCG/ML IJ SOSY
50.0000 ug | PREFILLED_SYRINGE | Freq: Once | INTRAMUSCULAR | Status: AC
  Administered 2022-12-07: 50 ug via INTRAVENOUS
  Filled 2022-12-07: qty 1

## 2022-12-07 NOTE — ED Triage Notes (Signed)
Pt to ED c/o cough and congestion x 1 month, treated with prednisone and inhaler with no relief. Also reports increase in leg swelling bilat x 3 weeks. Reports taking lasix as prescribed. Denies SHOB.

## 2022-12-07 NOTE — H&P (Cosign Needed)
Hospital Admission History and Physical Service Pager: [phone]  Patient name: Javier Berry Medical record number: [id number] Date of Birth: Jun 18, 1961 Age: 62 y.o. Gender: male  Primary Care Provider: Pcp, No Consultants: None  Code Status: Full Code  Preferred Emergency Contact:  Contact Information     Name Relation Home Work Mobile   Nicoll,Linda Spouse [phone]  [phone]   [place], [place] [phone]         Chief Complaint: Leg Swelling and SOB  Assessment and Plan: Javier Berry is a 62 y.o. male presenting with lower extremity edema and exertional dyspnea. Differential for this patient's presentation of this includes acute on chronic systolic heart failure exacerbation which is most likely due to apparent volume overload on exam.  Also consider COPD exacerbation with patient being on a Breo Ellipta however most likely pulmonary congestion is due to volume overload secondary to acute on chronic systolic heart failure.  Differential also includes PE, however patient has been anticoagulated on Eliquis 5 mg twice daily and he reports good compliance to his medication.  He denies any unilateral swelling or pain.    * Acute on chronic systolic CHF (congestive heart failure) (HCC) Volume overloaded on exam. Current weight 99.8, which appears less than dry weight from admission in 03/2022 of 105.7 kg.   - Admit to [facility], attending Dr. McDiarmid  - Med-tele, Vital signs per floor - heart healthy, fluid restriction 1800  - PT/OT to treat - VTE prophylaxis: Eliquis  - AM CBC/BMP  - S/P 40 mg IV Lasix in ED, re-dose tomorrow  - daily weight - strict I&Os  - echo ordered  HF Medications:   - Metoprolol 100 mg BID starting 3/11  - Lasix 40 mg PO  - consider adding Entresto   - consider adding SGLT2i   - consider adding Aldactone   Atrial fibrillation with RVR ([place]) Rate in 100-120 at bedside. Patient has been out of digoxin for over one month.  - cardiac  monitoring  - cont Eliquis 5 mg BID  - start digoxin load 0.25 mg x1 (3/10) - if HR does not respond can give an additional 0.25 mg every 6 hours not exceeding 1.5 mg in a 24 hours period  - attempt PO digoxin tomorrow   Hypokalemia K 3.1 on admission, s/p 40 mEq Klor tablet in the ED - recheck BMP daily  - Goal K >4, Mag > 2 - ordered Mag add on lab, replete as indicated   SOB (shortness of breath) on exertion Most likely secondary to volume overload. Expiratory wheezing with coarse breath sounds on exam. Doe snot carry diagnosis of COPD but is on Breo Ellipta at home.  - Duonebs Q6H PRN  - order Breo  - monitor for elevated HR after nebs and switch to tiotropium nebs if indicated   CKD (chronic kidney disease) stage 3, GFR 30-59 ml/min (HCC) Cr 1.29; baseline appears to be ~1.5-1.7 - daily BMP    FEN/GI: heart healthy, fluid restriction 1800 mL  VTE Prophylaxis: Eliquis  Disposition: med-tele, Attending Dr. McDiarmid   History of Present Illness:  ELIANA Berry is a 62 y.o. male presenting with bilateral LE edema and exertional SOB.   Patient report having exertional dyspnea for the past bonth since he developed bronchitis. He SOB is worse with the cough and in the past 2 weeks he has noticed swelling in he legs even though he has been complaint with his medication. He endorsed pain and pressure  in his knees since the swelling. Rates pain in the knee as an 11/10 and reports Fentanyl improved his pain. Has been controlling it with tylenol and NSAIDs at home.   He has noticed increase in heart rate and palpation for weeks now. Was supposed to be on Digitoxin but ran out of meds for about a month now. He follow with Dr. Stanford Breed as his cardiologist but haven't seen him in about a year.   In the ED, presenting in a fib with RVR. Labs significant for BNP 231, K 3.1, Cr 1.29. CXR WNL. Severe lower extremity edema on exam. Given 40 mg IV Lasix. Troponin trending and EKG showing a fib  RVR.   Review Of Systems: Per HPI with the following additions: as above   Pertinent Past Medical History: Systolic HF  CKD HTN Gout NICM pAF PUD Remainder reviewed in history tab.   Pertinent Past Surgical History: Cardioversion 2015  Remainder reviewed in history tab.   Pertinent Social History: Tobacco use: never Alcohol use: denies  Other Substance use: denies  Lives with wife   Pertinent Family History: Father: CHF, HTN  Mother: CKD, diabetes  Remainder reviewed in history tab.   Important Outpatient Medications: Allopurinol 100 mg  Digoxin 0.125 mg  Eliquis 5 mg BID Breo Ellipta  Lasix 40 mg  Toprol 100 mg  Remainder reviewed in medication history.   Objective: BP 122/89   Pulse (!) 33   Temp 98.6 F (37 C)   Resp (!) 25   Ht 6' (1.829 m)   Wt 99.8 kg   SpO2 96%   BMI 29.84 kg/m  Exam: Chronically ill-appearing, no acute distress Cardio: tachycardic, irregularly irregular rhythm, no murmurs on exam. Pulm: Clear, no wheezing, no crackles. No increased work of breathing Abdominal: bowel sounds present, soft, non-tender, non-distended Extremities: no peripheral edema  Neuro: alert and oriented x3, speech normal in content, no facial asymmetry, strength intact and equal bilaterally in UE and LE, pupils equal and reactive to light.    Labs:  CBC BMET  Recent Labs  Lab 12/07/22 1534  WBC 10.7*  HGB 13.3  HCT 39.5  PLT 143*   Recent Labs  Lab 12/07/22 1534  NA 137  K 3.1*  CL 105  CO2 24  BUN 15  CREATININE 1.29*  GLUCOSE 115*  CALCIUM 8.4*    Pertinent additional labs BNP 231.   EKG: a fib RVR  Imaging Studies Performed:  CXR: cardiac silhouette enlargement without acute cardiopulmonary changes   Darci Current, DO 12/07/2022, 6:55 PM PGY-1, [place] Intern pager: [phone], text pages welcome Secure chat group [place]  I was personally present and re-performed  the exam. I verified the service and findings are accurately documented in the intern's note.  Alen Bleacher, MD 12/08/2022 8:05 AM

## 2022-12-07 NOTE — Assessment & Plan Note (Signed)
>>  ASSESSMENT AND PLAN FOR ACUTE ON CHRONIC SYSTOLIC CHF (CONGESTIVE HEART FAILURE) (HCC) WRITTEN ON 12/13/2022  7:43 AM BY Jerre Simon, MD  Weight down to 88.2 kg (from 101kg on admission). UOP - 1.8 L 3/15 (total since admission 18.5L). Will hold IV lasix given rising creatinine.    - daily weight - strict I&Os  HF Medications:   - Metoprolol 25 mg BID   - Aldactone 25 mg (3/11) - Start Marcelline Deist 10mg  daily  - consider adding Entresto  - Flexeril 5 mg TID PRN for leg cramping and pain, optimizing electrolytes

## 2022-12-07 NOTE — Assessment & Plan Note (Addendum)
Rate controlled, now on PO Amiodarone - cardiology following, appreciate recs.  - Toprol 25 mg BID (decreased 3/14) - Dc'd Digoxin 0.125 mg daily (3/14) - PO Amiodarone 200mg  daily

## 2022-12-07 NOTE — ED Notes (Signed)
ED TO INPATIENT HANDOFF REPORT  ED Nurse Name and Phone #: [phone]  S Name/Age/Gender Reather Converse 62 y.o. male Room/Bed: TRAAC/TRAAC  Code Status   Code Status: Full Code  Home/SNF/Other Home Patient oriented to: self, place, time, and situation Is this baseline? Yes   Triage Complete: Triage complete  Chief Complaint Acute CHF ([place]) [I50.9]  Triage Note Pt to ED c/o cough and congestion x 1 month, treated with prednisone and inhaler with no relief. Also reports increase in leg swelling bilat x 3 weeks. Reports taking lasix as prescribed. Denies SHOB.    Allergies No Known Allergies  Level of Care/Admitting Diagnosis ED Disposition     ED Disposition  Admit   Condition  --   Comment  Hospital Area: [place] [[id number]]  Level of Care: Telemetry Medical [[id number]]  May place patient in observation at [facility] or [place] if equivalent level of care is available:: No  Covid Evaluation: Asymptomatic - no recent exposure (last 10 days) testing not required  Diagnosis: Acute CHF [facility]) [id number]  Admitting Physician: Rickey Primus  Attending Physician: MCDIARMID, TODD D [[id number]]          B Medical/Surgery History Past Medical History:  Diagnosis Date   Chronic systolic CHF (congestive heart failure) ([place])    a. 01/2015 Echo: EF 35-40%;  b. 10/2016 Echo: EF 30-35%, diff HK, mild MR, sev dil LA/RA, mod TR, PASP 4mHg.   CKD (chronic kidney disease), stage III ([place]    Essential hypertension    a. with h/o HTN urgency in the setting of noncompliance.   Gout    N[place] ([place]ischemic cardiomyopathy) ([place]    a. 01/2015 Echo: EF 35-40%;  b. 01/2015 Myoview: EF 30-44%, no ischemia/infarct;  c. 10/2016 Echo: EF 30-35%, diff HK.   Persistent atrial fibrillation ([place]    a. CHA2DS2VASc = 2-->Eliquis;  b. s/p failed DCCV in 11/2013;  c. 10/2016 Echo: severe biatrial enlargement.   PUD (peptic ulcer disease)    Past Surgical History:  Procedure  Laterality Date   CARDIOVERSION N/A 12/20/2013   Procedure: CARDIOVERSION;  Surgeon: BLelon Perla MD;  [facility];  Service: Cardiovascular;  Laterality: N/A;   TOE SURGERY       A IV Location/Drains/Wounds Patient Lines/Drains/Airways Status     Active Line/Drains/Airways     Name Placement date Placement time Site Days   Peripheral IV 12/07/22 20 G Anterior;Right Forearm 12/07/22  1640  Forearm  less than 1            Intake/Output Last 24 hours No intake or output data in the 24 hours ending 12/07/22 1910  Labs/Imaging Results for orders placed or performed during the hospital encounter of 12/07/22 (from the past 48 hour(s))  CBC with Differential     Status: Abnormal   Collection Time: 12/07/22  3:34 PM  Result Value Ref Range   WBC 10.7 (H) 4.0 - 10.5 K/uL   RBC 4.12 (L) 4.22 - 5.81 MIL/uL   Hemoglobin 13.3 13.0 - 17.0 g/dL   HCT 39.5 39.0 - 52.0 %   MCV 95.9 80.0 - 100.0 fL   MCH 32.3 26.0 - 34.0 pg   MCHC 33.7 30.0 - 36.0 g/dL   RDW 15.3 11.5 - 15.5 %   Platelets 143 (L) 150 - 400 K/uL   nRBC 0.0 0.0 - 0.2 %   Neutrophils Relative % 78 %   Neutro Abs 8.4 (H) 1.7 - 7.7 K/uL  Lymphocytes Relative 10 %   Lymphs Abs 1.1 0.7 - 4.0 K/uL   Monocytes Relative 8 %   Monocytes Absolute 0.9 0.1 - 1.0 K/uL   Eosinophils Relative 2 %   Eosinophils Absolute 0.2 0.0 - 0.5 K/uL   Basophils Relative 1 %   Basophils Absolute 0.1 0.0 - 0.1 K/uL   Immature Granulocytes 1 %   Abs Immature Granulocytes 0.05 0.00 - 0.07 K/uL    Comment: Performed at [facility], [place] [address] [place], [place] [id number]  Basic metabolic panel     Status: Abnormal   Collection Time: 12/07/22  3:34 PM  Result Value Ref Range   Sodium 137 135 - 145 mmol/L   Potassium 3.1 (L) 3.5 - 5.1 mmol/L   Chloride 105 98 - 111 mmol/L   CO2 24 22 - 32 mmol/L   Glucose, Bld 115 (H) 70 - 99 mg/dL    Comment: Glucose reference range applies only to samples taken after fasting for at  least 8 hours.   BUN 15 8 - 23 mg/dL   Creatinine, Ser 1.29 (H) 0.61 - 1.24 mg/dL   Calcium 8.4 (L) 8.9 - 10.3 mg/dL   GFR, Estimated >60 >60 mL/min    Comment: (NOTE) Calculated using the CKD-EPI Creatinine Equation (2021)    Anion gap 8 5 - 15    Comment: Performed at [facility] [address], [place], [place] [address]  Brain natriuretic peptide     Status: Abnormal   Collection Time: 12/07/22  3:34 PM  Result Value Ref Range   B Natriuretic Peptide 231.0 (H) 0.0 - 100.0 pg/mL    Comment: Performed at [facility] [address] [place], [place] [address]  Troponin I (High Sensitivity)     Status: None   Collection Time: 12/07/22  4:30 PM  Result Value Ref Range   Troponin I (High Sensitivity) 15 <18 ng/L    Comment: (NOTE) Elevated high sensitivity troponin I (hsTnI) values and significant  changes across serial measurements may suggest ACS but many other  chronic and acute conditions are known to elevate hsTnI results.  Refer to the "Links" section for chest pain algorithms and additional  guidance. Performed at [facility], [place] [address], [place], [place] [address]    DG Chest Portable 1 View  Result Date: 12/07/2022 CLINICAL DATA:  Dyspnea. EXAM: PORTABLE CHEST 1 VIEW COMPARISON:  04/02/2022 FINDINGS: The cardio pericardial silhouette is enlarged. The lungs are clear without focal pneumonia, edema, pneumothorax or pleural effusion. The visualized bony structures of the thorax are unremarkable. Telemetry leads overlie the chest. IMPRESSION: Enlargement of the cardiopericardial silhouette without acute cardiopulmonary findings. Electronically Signed   By: Misty Stanley M.D.   On: 12/07/2022 17:04    Pending Labs Unresulted Labs (From admission, onward)     Start     Ordered   12/08/22 [id number]  Basic metabolic panel  Tomorrow morning,   R        12/07/22 1840   12/08/22 0500  CBC  Tomorrow morning,   R        12/07/22 1840   12/07/22 1848  Magnesium   Add-on,   AD        12/07/22 1847            Vitals/Pain Today's Vitals   12/07/22 1745 12/07/22 1815 12/07/22 1830 12/07/22 1830  BP: 113/78 122/89    Pulse: (!) 33  Resp: 12 (!) 25    Temp:      SpO2: 96%     Weight:      Height:      PainSc:   5  5     Isolation Precautions No active isolations  Medications Medications  acetaminophen (TYLENOL) tablet 650 mg (has no administration in time range)    Or  acetaminophen (TYLENOL) suppository 650 mg (has no administration in time range)  ipratropium-albuterol (DUONEB) 0.5-2.5 (3) MG/3ML nebulizer solution 3 mL (has no administration in time range)  fluticasone furoate-vilanterol (BREO ELLIPTA) 100-25 MCG/ACT 1 puff (has no administration in time range)  apixaban (ELIQUIS) tablet 5 mg (has no administration in time range)  digoxin (LANOXIN) 0.25 MG/ML injection 0.25 mg (has no administration in time range)  metoprolol succinate (TOPROL-XL) 24 hr tablet 100 mg (has no administration in time range)  metoprolol tartrate (LOPRESSOR) tablet 50 mg (50 mg Oral Given 12/07/22 1707)  furosemide (LASIX) injection 40 mg (40 mg Intravenous Given 12/07/22 1707)  fentaNYL (SUBLIMAZE) injection 50 mcg (50 mcg Intravenous Given 12/07/22 1706)  potassium chloride SA (KLOR-CON M) CR tablet 40 mEq (40 mEq Oral Given 12/07/22 1831)    Mobility walks     Focused Assessments Cardiac Assessment Handoff:  Cardiac Rhythm: Atrial fibrillation Lab Results  Component Value Date   TROPONINI <0.03 02/04/2015   No results found for: "DDIMER" Does the Patient currently have chest pain? No    R Recommendations: See Admitting Provider Note  Report given to:   Additional Notes:  Pt HR responded well to metoprolol.  200 cc urine output since lasix.  Will need pain meds on a regular basis for R knee pain. AO x 4.  Very pleasant.

## 2022-12-07 NOTE — Assessment & Plan Note (Signed)
Most likely secondary to volume overload. Expiratory wheezing with coarse breath sounds on exam. Doe snot carry diagnosis of COPD but is on Breo Ellipta at home.  - Duonebs Q6H PRN  - order Breo  - monitor for elevated HR after nebs and switch to tiotropium nebs if indicated

## 2022-12-07 NOTE — ED Provider Notes (Signed)
[facility] Provider Note   CSN: [id number] Arrival date & time: 12/07/22  1510     History  Chief Complaint  Patient presents with   Cough   Leg Swelling    BILAT    Javier Berry is a 62 y.o. male.  This is a 62 year old male with history of hypertension, CHF on furosemide, and paroxysmal atrial fibrillation on Eliquis presenting to the ED for worsening bilateral lower extremity swelling and exertional shortness of breath.  Patient states over the last 3 weeks in particular his swelling has been worsening.  He has intermittent response to Lasix where he has what he believes is adequate urine output.  He states he has gained weight as well.  He now is short of breath on simple activities and cannot ambulate even a short distance without becoming dyspneic.  He denies any chest pain, abdominal pain, nausea or vomiting.        Home Medications Prior to Admission medications   Medication Sig Start Date End Date Taking? Authorizing Provider  acetaminophen (TYLENOL) 325 MG tablet Take 650 mg by mouth every 6 (six) hours as needed for mild pain.    [provider]  allopurinol (ZYLOPRIM) 100 MG tablet Take 1 tablet (100 mg total) by mouth daily. 04/06/22   Ghimire, Henreitta Leber, MD  digoxin (LANOXIN) 0.125 MG tablet Take 1 tablet (0.125 mg total) by mouth daily. 04/07/22   Ghimire, Henreitta Leber, MD  ELIQUIS 5 MG TABS tablet Take 1 tablet (5 mg total) by mouth 2 (two) times daily. TAKE 1 TABLET BY MOUTH 2 TIMES DAILY pt needs appointment and labs 04/06/22   Jonetta Osgood, MD  fluticasone furoate-vilanterol (BREO ELLIPTA) 100-25 MCG/ACT AEPB Inhale 1 puff into the lungs daily as needed (shortness of breath). 04/06/22   Ghimire, Henreitta Leber, MD  furosemide (LASIX) 40 MG tablet Take 1 tablet (40 mg total) by mouth 2 (two) times daily. 04/06/22   Ghimire, Henreitta Leber, MD  metoprolol succinate (TOPROL-XL) 100 MG 24 hr tablet Take 1 tablet (100 mg total)  by mouth 2 (two) times daily. 04/06/22   Ghimire, Henreitta Leber, MD      Allergies    Patient has no known allergies.    Review of Systems   Review of Systems  Constitutional:  Negative for chills and fever.  Respiratory:  Positive for cough and shortness of breath.   Cardiovascular:  Negative for chest pain.  Gastrointestinal:  Negative for abdominal pain.  Neurological:  Positive for dizziness.    Physical Exam Updated Vital Signs BP (!) 135/111   Pulse 81   Temp 98.6 F (37 C)   Resp 18   Ht 6' (1.829 m)   Wt 99.8 kg   SpO2 98%   BMI 29.84 kg/m  Physical Exam Vitals and nursing note reviewed.  Constitutional:      General: He is not in acute distress. HENT:     Head: Normocephalic.     Nose: Nose normal.     Mouth/Throat:     Mouth: Mucous membranes are moist.  Eyes:     Pupils: Pupils are equal, round, and reactive to light.  Cardiovascular:     Rate and Rhythm: Tachycardia present. Rhythm irregular.     Pulses: Normal pulses.     Heart sounds: No murmur heard.    No friction rub. No gallop.  Pulmonary:     Effort: Pulmonary effort is normal.  Breath sounds: Rhonchi (Right lower lobe and right lung fields) and rales (Right lower lobe) present.  Abdominal:     General: There is no distension.     Palpations: Abdomen is soft.     Tenderness: There is no abdominal tenderness. There is no guarding or rebound.  Musculoskeletal:     Right lower leg: Edema (4+ pitting) present.     Left lower leg: Edema (4+ pitting) present.  Skin:    Capillary Refill: Capillary refill takes less than 2 seconds.  Neurological:     General: No focal deficit present.     Mental Status: He is alert and oriented to person, place, and time.     ED Results / Procedures / Treatments   Labs (all labs ordered are listed, but only abnormal results are displayed) Labs Reviewed  CBC WITH DIFFERENTIAL/PLATELET - Abnormal; Notable for the following components:      Result Value   WBC  10.7 (*)    RBC 4.12 (*)    Platelets 143 (*)    Neutro Abs 8.4 (*)    All other components within normal limits  BASIC METABOLIC PANEL  BRAIN NATRIURETIC PEPTIDE    EKG None  Radiology No results found.  Procedures Procedures    Medications Ordered in ED Medications - No data to display  ED Course/ Medical Decision Making/ A&P                             Medical Decision Making Patient presents with leg swelling, chronic cough and dyspnea on exertion.  He is also in A-fib RVR on the monitor.  His clinical picture is complicated by atrial fibrillation, CHF with reduced EF of 30 to 35% upon chart review.  Given his worsening shortness of breath with exertion and this has been ongoing for several weeks, this is likely congestive heart failure.  Differential diagnosis includes ACS, pneumothorax, pleural effusion, pneumonia.  Will obtain troponins, EKG, CBC, CMP, BNP and chest x-ray for further evaluation.  Patient given 40 mg Lasix.  He is chronically on metoprolol succinate at home 100 mg daily, he did not take his dose today.  I will give patient 50 mg metoprolol tartrate oral to help with rate control.  I personally reviewed and interpreted patient's labs, hypokalemia to 3.1, 40 mEq oral potassium ordered.  Patient has an improving creatinine to 1.29, BNP slightly elevated 231, troponin stable at 15, leukocytosis of 10.7, no left shift.  I personally reviewed and interpreted patient's chest x-ray, he has cardiomegaly with mild pulmonary edema.  I personally reviewed and interpreted patient's EKG, atrial fibrillation with RVR, rate 141, no acute ischemic changes.  Patient's overall presentation is consistent with an acute heart failure exacerbation.  I called discussed this case with hospitalist team who agreed to admit patient to their service for further management.  Patient was stable upon admission to hospital.  Problems Addressed: Acute on chronic congestive heart failure,  unspecified heart failure type [facility]): acute illness or injury that poses a threat to life or bodily functions  Amount and/or Complexity of Data Reviewed Labs: ordered. Decision-making details documented in ED Course. Radiology: ordered and independent interpretation performed. Decision-making details documented in ED Course. ECG/medicine tests: ordered and independent interpretation performed. Decision-making details documented in ED Course.  Risk Prescription drug management. Decision regarding hospitalization.          Final Clinical Impression(s) / ED Diagnoses Final diagnoses:  None  Rx / DC Orders ED Discharge Orders     None         Jimmie Molly, MD 12/07/22 Marko Stai    Elnora Morrison, MD 12/08/22 [phone]

## 2022-12-07 NOTE — Progress Notes (Signed)
The patient just had 3 beats of VTs and he's been having frequent PVC s. His HR fluctuates from 110's to 120 s and occasionally goes to 140 s with exertion like using his urinal. He's asymptomatic on reassessment. Notified Dr. Ruben Im and he replied that they may increase the digoxin  dose at 0100 am. Will continue to monitor.

## 2022-12-07 NOTE — Assessment & Plan Note (Addendum)
Elevated Cr (1.48)  from diuresis  -Will hold lasix - daily BMP

## 2022-12-07 NOTE — Assessment & Plan Note (Addendum)
Weight down to 88.2 kg (from 101kg on admission). UOP - 1.8 L 3/15 (total since admission 18.5L). Will hold IV lasix given rising creatinine.    - daily weight - strict I&Os  HF Medications:   - Metoprolol 25 mg BID   - Aldactone 25 mg (3/11) - Start Marcelline Deist 10mg  daily  - consider adding Entresto  - Flexeril 5 mg TID PRN for leg cramping and pain, optimizing electrolytes

## 2022-12-07 NOTE — Plan of Care (Signed)
The patient is admitted to 3 E 22. A & O x 4. Patient oriented to his room. ascom and staff. Full assessment to epic completed. Will continue to monitor.

## 2022-12-07 NOTE — Assessment & Plan Note (Addendum)
K and Mag stable  - recheck BMP daily  - Goal K >4, Mag > 2 - ordered 20 mEq K-Lor 3/15

## 2022-12-08 ENCOUNTER — Observation Stay (HOSPITAL_COMMUNITY): Payer: BC Managed Care – PPO

## 2022-12-08 ENCOUNTER — Other Ambulatory Visit (HOSPITAL_COMMUNITY): Payer: Self-pay

## 2022-12-08 ENCOUNTER — Observation Stay (HOSPITAL_BASED_OUTPATIENT_CLINIC_OR_DEPARTMENT_OTHER): Payer: BC Managed Care – PPO

## 2022-12-08 DIAGNOSIS — I5023 Acute on chronic systolic (congestive) heart failure: Secondary | ICD-10-CM | POA: Diagnosis not present

## 2022-12-08 DIAGNOSIS — I5021 Acute systolic (congestive) heart failure: Secondary | ICD-10-CM | POA: Diagnosis not present

## 2022-12-08 DIAGNOSIS — R609 Edema, unspecified: Secondary | ICD-10-CM | POA: Diagnosis not present

## 2022-12-08 DIAGNOSIS — I4891 Unspecified atrial fibrillation: Secondary | ICD-10-CM | POA: Diagnosis not present

## 2022-12-08 DIAGNOSIS — E876 Hypokalemia: Secondary | ICD-10-CM | POA: Diagnosis not present

## 2022-12-08 LAB — BASIC METABOLIC PANEL
Anion gap: 9 (ref 5–15)
BUN: 12 mg/dL (ref 8–23)
CO2: 26 mmol/L (ref 22–32)
Calcium: 8.3 mg/dL — ABNORMAL LOW (ref 8.9–10.3)
Chloride: 103 mmol/L (ref 98–111)
Creatinine, Ser: 1.34 mg/dL — ABNORMAL HIGH (ref 0.61–1.24)
GFR, Estimated: 60 mL/min — ABNORMAL LOW (ref >60)
Glucose, Bld: 93 mg/dL (ref 70–99)
Potassium: 3.5 mmol/L (ref 3.5–5.1)
Sodium: 138 mmol/L (ref 135–145)

## 2022-12-08 LAB — ECHOCARDIOGRAM COMPLETE
AR max vel: 3.78 cm2
AV Area VTI: 3.72 cm2
AV Area mean vel: 3.69 cm2
AV Mean grad: 3.3 mmHg
AV Peak grad: 6.3 mmHg
Ao pk vel: 1.26 m/s
Area-P 1/2: 3.46 cm2
Height: 72 in
S' Lateral: 4.5 cm
Weight: 3562.63 oz

## 2022-12-08 LAB — CBC
HCT: 37.8 % — ABNORMAL LOW (ref 39.0–52.0)
Hemoglobin: 12.6 g/dL — ABNORMAL LOW (ref 13.0–17.0)
MCH: 32 pg (ref 26.0–34.0)
MCHC: 33.3 g/dL (ref 30.0–36.0)
MCV: 95.9 fL (ref 80.0–100.0)
Platelets: 150 10*3/uL (ref 150–400)
RBC: 3.94 MIL/uL — ABNORMAL LOW (ref 4.22–5.81)
RDW: 15.3 % (ref 11.5–15.5)
WBC: 8.8 10*3/uL (ref 4.0–10.5)
nRBC: 0 % (ref 0.0–0.2)

## 2022-12-08 LAB — MAGNESIUM: Magnesium: 1.9 mg/dL (ref 1.7–2.4)

## 2022-12-08 MED ORDER — DIGOXIN 0.25 MG/ML IJ SOLN
0.2500 mg | Freq: Once | INTRAMUSCULAR | Status: AC
  Administered 2022-12-08: 0.25 mg via INTRAVENOUS
  Filled 2022-12-08: qty 2

## 2022-12-08 MED ORDER — CYCLOBENZAPRINE HCL 5 MG PO TABS
5.0000 mg | ORAL_TABLET | Freq: Once | ORAL | Status: AC | PRN
  Administered 2022-12-08: 5 mg via ORAL
  Filled 2022-12-08: qty 1

## 2022-12-08 MED ORDER — MAGNESIUM SULFATE IN D5W 1-5 GM/100ML-% IV SOLN
1.0000 g | Freq: Once | INTRAVENOUS | Status: AC
  Administered 2022-12-08: 1 g via INTRAVENOUS
  Filled 2022-12-08: qty 100

## 2022-12-08 MED ORDER — IPRATROPIUM BROMIDE 0.02 % IN SOLN
0.5000 mg | Freq: Four times a day (QID) | RESPIRATORY_TRACT | Status: DC | PRN
  Administered 2022-12-11: 0.5 mg via RESPIRATORY_TRACT
  Filled 2022-12-08: qty 2.5

## 2022-12-08 MED ORDER — SPIRONOLACTONE 25 MG PO TABS
25.0000 mg | ORAL_TABLET | Freq: Every day | ORAL | Status: DC
  Administered 2022-12-08 – 2022-12-13 (×6): 25 mg via ORAL
  Filled 2022-12-08 (×6): qty 1

## 2022-12-08 MED ORDER — OXYCODONE HCL 5 MG PO TABS
5.0000 mg | ORAL_TABLET | Freq: Once | ORAL | Status: AC | PRN
  Administered 2022-12-08: 5 mg via ORAL
  Filled 2022-12-08: qty 1

## 2022-12-08 MED ORDER — POTASSIUM CHLORIDE CRYS ER 20 MEQ PO TBCR
40.0000 meq | EXTENDED_RELEASE_TABLET | Freq: Once | ORAL | Status: AC
  Administered 2022-12-08: 40 meq via ORAL
  Filled 2022-12-08: qty 2

## 2022-12-08 MED ORDER — METOPROLOL TARTRATE 50 MG PO TABS: 50.0000 mg | ORAL_TABLET | Freq: Two times a day (BID) | ORAL | Status: DC

## 2022-12-08 MED ORDER — FUROSEMIDE 10 MG/ML IJ SOLN
120.0000 mg | Freq: Two times a day (BID) | INTRAVENOUS | Status: DC
  Administered 2022-12-08 – 2022-12-12 (×8): 120 mg via INTRAVENOUS
  Filled 2022-12-08: qty 2
  Filled 2022-12-08 (×2): qty 10
  Filled 2022-12-08: qty 12
  Filled 2022-12-08 (×2): qty 10
  Filled 2022-12-08: qty 2
  Filled 2022-12-08: qty 12
  Filled 2022-12-08 (×2): qty 10

## 2022-12-08 MED ORDER — FUROSEMIDE 10 MG/ML IJ SOLN
80.0000 mg | Freq: Once | INTRAMUSCULAR | Status: AC
  Administered 2022-12-08: 80 mg via INTRAVENOUS
  Filled 2022-12-08: qty 8

## 2022-12-08 MED ORDER — SODIUM CHLORIDE 0.9 % IV SOLN
INTRAVENOUS | Status: DC | PRN
  Administered 2022-12-08: 10 mL via INTRAVENOUS

## 2022-12-08 MED ORDER — DIGOXIN 125 MCG PO TABS
0.1250 mg | ORAL_TABLET | Freq: Every day | ORAL | Status: DC
  Administered 2022-12-08 – 2022-12-11 (×4): 0.125 mg via ORAL
  Filled 2022-12-08 (×4): qty 1

## 2022-12-08 MED ORDER — METOPROLOL SUCCINATE ER 100 MG PO TB24
100.0000 mg | ORAL_TABLET | Freq: Two times a day (BID) | ORAL | Status: DC
  Administered 2022-12-08 – 2022-12-11 (×8): 100 mg via ORAL
  Filled 2022-12-08 (×9): qty 1

## 2022-12-08 NOTE — Plan of Care (Signed)

## 2022-12-08 NOTE — Progress Notes (Signed)
The  is c/o right knee pain of 7/10 unrelieved with Tylenol. The wife is requesting for something stronger for him . Notified Dr. Ruben Im . New order of Oxycodone  5 mg x one dose received from Dr. Joelyn Oms.

## 2022-12-08 NOTE — Progress Notes (Signed)
   Heart Failure Stewardship Pharmacist Progress Note   PCP: Pcp, No PCP-Cardiologist: Kirk Ruths, MD    HPI:  62 yo M with PMH of HTN, CHF, CKD, and afib.   Presented to the ED on 3/10 with LE edema, shortness of breath, weight gain, with cough and congestion. Reports he has been off digoxin for several months but was still taking metoprolol and Elliquis. In afib RVR on arrival. CXR with enlargement of the cardiopericardial silhouette without acute cardiopulmonary findings. EF was 35% in 2016 and 2018, improved to 55-60% in 2020, back down to 30-35% in 2023 after being lost to follow up with cardiology and was no longer taking meds. ECHO this admission showed LVEF 25-30%, global hypokinesis, mild concentric LVH, RV hyperdynamic, and mildly elevated PA pressures.  Current HF Medications: Diuretic: furosemide 120 mg IV BID Beta Blocker: metoprolol XL 100 mg BID MRA: spironolactone 25 mg daily Other: digoxin 0.125 mg daily  Prior to admission HF Medications: Diuretic: furosemide 40 mg daily Beta blocker: metoprolol XL 25 mg daily  Pertinent Lab Values: Serum creatinine 1.34, BUN 12, Potassium 3.5, Sodium 138, BNP 231, Magnesium 1.9, Digoxin level not yet ordered  Vital Signs: Weight: 222 lbs (admission weight: 222 lbs) Blood pressure: 120/80s  Heart rate: 120s  I/O: -0.4L yesterday; net -4.3L  Medication Assistance / Insurance Benefits Check: Does the patient have prescription insurance?  Yes Type of insurance plan: [place] state health plan  Outpatient Pharmacy:  Prior to admission outpatient pharmacy: [facility] Is the patient willing to use [facility] pharmacy at discharge? Yes Is the patient willing to transition their outpatient pharmacy to utilize a [facility] outpatient pharmacy?   Pending    Assessment: 1. Acute on chronic systolic CHF (LVEF 14-43%), due to NICM. NYHA class III symptoms. - Agree with increasing to furosemide 120 mg IV BID. Strict I/Os and daily  weights. Keep K>4 and Mg>2. KCl 40 mEq and Mg 1 g IV x 1 given for replacement. - Agree with transitioning to metoprolol XL 100 mg BID, will need to monitor urine output closely to ensure he is not in low output heart failure, it appears he was only taking 25 mg daily prior to admission. - Agree with adding spironolactone 25 mg daily - Agree with continuing digoxin, received 0.25 mg IV on 3/10 and 3/11. Digoxin 0.125 mg daily ordered to start today. Recommend checking digoxin level in AM.     Plan: 1) Medication changes recommended at this time: - Agree with changes - Check digoxin level in AM to confirm he is not supratherapeutic after 2 IV doses - May need to reduce metoprolol dose  2) Patient assistance: - Farxiga copay $47 - copay card lowers to $0 per month  3)  Education  - To be completed prior to discharge  Kerby Nora, PharmD, BCPS Heart Failure Stewardship Pharmacist Phone [phone]

## 2022-12-08 NOTE — Progress Notes (Addendum)
Daily Progress Note Intern Pager: [phone]  Patient name: Javier Berry Medical record number: [id number] Date of birth: 02-19-1961 Age: 62 y.o. Gender: male  Primary Care Provider: Pcp, No Consultants: Cardiology  Code Status: Full code   Pt Overview and Major Events to Date:  3/10: Admitted   Assessment and Plan:  Javier Berry is a 62 y.o. male presenting with lower extremity edema and exertional dyspnea. Pertinent PMH/PSH includes HFrEF, A fib, HTN. CKD3, Gout, NICM, PUD.   Minimal urine output overnight with 40 mg IV Lasix, Cr stable and still below baseline. Will re-dose Lasix at 80 mg. Consulted Cardiology to help with A fib w/ RVR. Attempted digoxin load overnight but rate still uncontrolled. Patient is on Duonebs as needed, albuterol may be a factor in his tachycardia, will switch to LAMA nebs only to minimize this response while attempting rate control.   On admission patient had bilateral LE edema that was worse on the right. Patient discussed previous injury to his right knee and reports his right side is more sensitive to swelling. He reports good compliance with his Eliquis at home, however he has not followed up outpatient with cardiology. Will order LE doppler to r/o DVT.   * Acute on chronic systolic CHF (congestive heart failure) (HCC) Current weight 99.8, which appears less than dry weight from admission in 03/2022 of 105.7 kg.   - daily weight - strict I&Os  - echo ordered  - with asymmetric edema, more significant on the right, will order DVT U/S HF Medications:   - Metoprolol 100 mg BID starting 3/11  - Lasix 80 mg IV (3/11)  - consider adding Entresto   - consider adding SGLT2i   - consider adding Aldactone   Atrial fibrillation with RVR ([place]) Rate in 100-120 at bedside. Patient has been out of digoxin for over one month.  - cont Eliquis 5 mg BID, pt reports good compliance  - Received digoxin 0.25 mg IV x2 (3/10) overnight  - PO digoxin and  metoprolol ordered for 10 am this morning.  Hypokalemia K 3.5 s/p 40 mEq Klor tablet in the ED - recheck BMP daily  - Goal K >4, Mag > 2 - ordered Mag add on lab, replete as indicated  - ordered 40 mEq K-lor tablet  - ordered 1 g Mag replacement   SOB (shortness of breath) on exertion Improved from yesterday with Duonebs. Due to difficult to control rate, will DC Duonebs and start LAMA only.  - Atrovent nebs - cont Breo   CKD (chronic kidney disease) stage 3, GFR 30-59 ml/min (HCC) Cr 1.34; baseline appears to be ~1.5-1.7 - daily BMP    FEN/GI: heart healthy, fluid restriction 1800 mL  PPx: Eliquis  Dispo:Home pending clinical improvement . Barriers include ongoing medical management.   Subjective:  NAEO, patient reports not great urine output. Continues to have severe edema in LE.   Objective: Temp:  [97.5 F (36.4 C)-98.6 F (37 C)] 98 F (36.7 C) (03/11 0755) Pulse Rate:  [33-135] 76 (03/11 0841) Resp:  [12-25] 18 (03/11 0841) BP: (101-142)/(70-111) 115/89 (03/11 0755) SpO2:  [96 %-100 %] 97 % (03/11 0841) Weight:  [99.8 kg-101 kg] 101 kg (03/11 0405) Physical Exam: General: chronically ill, no acute distress  Cardiovascular: tachycardic, irregularly irregular rhythm, no murmurs on exam  Respiratory: improved from yesterday, better air movement. No increased work of breathing  Abdomen: soft, non-tender, non-distended  Extremities: significant pitting edema +3, noted swelling worse  on the right   Laboratory: Most recent CBC Lab Results  Component Value Date   WBC 8.8 12/08/2022   HGB 12.6 (L) 12/08/2022   HCT 37.8 (L) 12/08/2022   MCV 95.9 12/08/2022   PLT 150 12/08/2022   Most recent BMP    Latest Ref Rng & Units 12/08/2022    7:01 AM  BMP  Glucose 70 - 99 mg/dL 93   BUN 8 - 23 mg/dL 12   Creatinine 0.61 - 1.24 mg/dL 1.34   Sodium 135 - 145 mmol/L 138   Potassium 3.5 - 5.1 mmol/L 3.5   Chloride 98 - 111 mmol/L 103   CO2 22 - 32 mmol/L 26   Calcium  8.9 - 10.3 mg/dL 8.3    Darci Current, DO 12/08/2022, 8:42 AM  PGY-1, [place] Intern pager: [phone], text pages welcome Secure chat group [place]

## 2022-12-08 NOTE — TOC Benefit Eligibility Note (Signed)
Patient Teacher, English as a foreign language completed.    The patient is currently admitted and upon discharge could be taking Farxiga 10 mg.  The current 30 day co-pay is $47.00.   The patient is currently admitted and upon discharge could be taking Jardiance 10 mg.  The current 30 day co-pay is $47.00.   The patient is insured through [place], [place] Patient [place] Patient Advocate Team Direct Number: [phone]  Fax: [phone]

## 2022-12-08 NOTE — Consult Note (Addendum)
Cardiology Consultation   Patient ID: [name] MRN: [id number]; DOB: 17-Apr-1961  Admit date: 12/07/2022 Date of Consult: 12/08/2022  PCP:  Kathyrn Lass   [facility] Providers Cardiologist:  Kirk Ruths, MD        Patient Profile:   Javier Berry is a 62 y.o. male with a hx of chronic systolic heart failure, permanent atrial fibrillation, hypertension, and CKD stage III who is being seen 12/08/2022 for the evaluation of afib with RVR at the request of Dr. McDiarmid.  History of Present Illness:   Javier Berry is a 62 year old male with past medical history of chronic systolic heart failure, permanent atrial fibrillation, hypertension, and CKD stage III.  Echocardiogram in February 2015 showed normal EF, mild to moderate biatrial enlargement.  He was found to have reduced EF in 2016.  Myoview in May 2016 showed moderate LV dysfunction, normal perfusion.  Echocardiogram in February 2018 showed EF 30 to 35%, mild MR, severe biatrial enlargement and moderate TR.  Patient was seen by Dr. Stanford Breed in July 2020, subsequent heart monitor showed well-controlled heart rate while in permanent A-fib.  Echocardiogram obtained in July 2020 showed EF 55 to 60%, RVSP 26.5 mmHg, moderate left atrial enlargement, severe right atrial enlargement.  Patient was last admitted in the hospital in July 2023 with acute heart failure exacerbation in the setting of atrial fibrillation with RVR.  Symptoms were going on for 3 months and he was not taking any metoprolol.  Heart rate on arrival was 160s.  He was initially placed on IV Cardizem and amiodarone.  Echocardiogram obtained on 04/03/2022 showed EF 30 to 35%, severe biatrial enlargement, trivial MR, mild-moderate TR. he also underwent IV diuresis.  Patient was ultimately discharged on metoprolol succinate 100 mg twice a day, furosemide 40 mg twice a day, digoxin and Eliquis.  Patient returned to the hospital yesterday with complaint of shortness of breath  with exertion and lower extremity edema.  He says he has run out of digoxin a while back and was unable to get a refill.  He is still taking metoprolol and Eliquis.  He was in A-fib with RVR on arrival.  Patient has been given IV diuresis with good urinary output.  Venous Doppler was ordered as he has more right lower extremity edema compared to the left side.  So far, he has received 40 mg IV Lasix yesterday and 80 mg today, he has great urinary output.  He has been reloaded on digoxin, heart rate remains elevated in the 110s to 130s range.  Cardiology service was consulted for atrial fibrillation with RVR.  Echocardiogram has been ordered and is currently pending.   Past Medical History:  Diagnosis Date   Chronic systolic CHF (congestive heart failure) ([place])    a. 01/2015 Echo: EF 35-40%;  b. 10/2016 Echo: EF 30-35%, diff HK, mild MR, sev dil LA/RA, mod TR, PASP 29mHg.   CKD (chronic kidney disease), stage III ([place]    Essential hypertension    a. with h/o HTN urgency in the setting of noncompliance.   Gout    NICM (nonischemic cardiomyopathy) ([place]    a. 01/2015 Echo: EF 35-40%;  b. 01/2015 Myoview: EF 30-44%, no ischemia/infarct;  c. 10/2016 Echo: EF 30-35%, diff HK.   Persistent atrial fibrillation ([place]    a. CHA2DS2VASc = 2-->Eliquis;  b. s/p failed DCCV in 11/2013;  c. 10/2016 Echo: severe biatrial enlargement.   PUD (peptic ulcer disease)     Past Surgical History:  Procedure Laterality Date   CARDIOVERSION N/A 12/20/2013   Procedure: CARDIOVERSION;  Surgeon: Lelon Perla, MD;  Location: [facility] ENDOSCOPY;  Service: Cardiovascular;  Laterality: N/A;   TOE SURGERY       Home Medications:  Prior to Admission medications   Medication Sig Start Date End Date Taking? Authorizing Provider  allopurinol (ZYLOPRIM) 100 MG tablet Take 1 tablet (100 mg total) by mouth daily. Patient taking differently: Take 100 mg by mouth daily as needed (for gout flares). 04/06/22  Yes Ghimire, Henreitta Leber, MD   BUFFERIN EXTRA STRENGTH 500 MG TABS Take 1,000 mg by mouth daily as needed (for pain).   Yes [provider]  ELIQUIS 5 MG TABS tablet Take 1 tablet (5 mg total) by mouth 2 (two) times daily. TAKE 1 TABLET BY MOUTH 2 TIMES DAILY pt needs appointment and labs Patient taking differently: Take 5 mg by mouth 2 (two) times daily. 04/06/22  Yes Ghimire, Henreitta Leber, MD  furosemide (LASIX) 40 MG tablet Take 1 tablet (40 mg total) by mouth 2 (two) times daily. Patient taking differently: Take 40 mg by mouth in the morning. 04/06/22  Yes Ghimire, Henreitta Leber, MD  metoprolol succinate (TOPROL-XL) 25 MG 24 hr tablet Take 25 mg by mouth daily.   Yes [provider]  MUCINEX 600 MG 12 hr tablet Take 600-1,200 mg by mouth 2 (two) times daily as needed for to loosen phlegm or cough.   Yes [provider]  VENTOLIN HFA 108 (90 Base) MCG/ACT inhaler Inhale 1-2 puffs into the lungs every 6 (six) hours as needed for wheezing or shortness of breath.   Yes [provider]  digoxin (LANOXIN) 0.125 MG tablet Take 1 tablet (0.125 mg total) by mouth daily. Patient not taking: Reported on 12/07/2022 04/07/22   Jonetta Osgood, MD  fluticasone furoate-vilanterol (BREO ELLIPTA) 100-25 MCG/ACT AEPB Inhale 1 puff into the lungs daily as needed (shortness of breath). Patient not taking: Reported on 12/07/2022 04/06/22   Jonetta Osgood, MD  metoprolol succinate (TOPROL-XL) 100 MG 24 hr tablet Take 1 tablet (100 mg total) by mouth 2 (two) times daily. Patient not taking: Reported on 12/07/2022 04/06/22   Jonetta Osgood, MD    Inpatient Medications: Scheduled Meds:  apixaban  5 mg Oral BID   digoxin  0.125 mg Oral Daily   influenza vac split quadrivalent PF  0.5 mL Intramuscular Tomorrow-1000   metoprolol tartrate  50 mg Oral BID   Continuous Infusions:  sodium chloride 10 mL (12/08/22 0958)   magnesium sulfate bolus IVPB 1 g (12/08/22 1003)   PRN Meds: sodium chloride, acetaminophen  **OR** acetaminophen, fluticasone furoate-vilanterol, ipratropium  Allergies:   No Known Allergies  Social History:   Social History   Socioeconomic History   Marital status: Married    Spouse name: Not on file   Number of children: 3   Years of education: Not on file   Highest education level: Not on file  Occupational History    Employer: [employer]   Occupation: retired bus driver  Tobacco Use   Smoking status: Never   Smokeless tobacco: Never  Substance and Sexual Activity   Alcohol use: No    Comment: Occasional   Drug use: No   Sexual activity: Not on file  Other Topics Concern   Not on file  Social History Narrative   Not on file   Social Determinants of Health   Financial Resource Strain: Not on file  Food  Insecurity: No Food Insecurity (12/07/2022)   Hunger Vital Sign    Worried About Running Out of Food in the Last Year: Never true    Ran Out of Food in the Last Year: Never true  Transportation Needs: No Transportation Needs (12/07/2022)   PRAPARE - Hydrologist (Medical): No    Lack of Transportation (Non-Medical): No  Physical Activity: Not on file  Stress: Not on file  Social Connections: Not on file  Intimate Partner Violence: Not At Risk (12/07/2022)   Humiliation, Afraid, Rape, and Kick questionnaire    Fear of Current or Ex-Partner: No    Emotionally Abused: No    Physically Abused: No    Sexually Abused: No    Family History:    Family History  Problem Relation Age of Onset   Kidney disease Mother    Diabetes Mother    Hypertension Father    Congestive Heart Failure Father    Kidney disease Sister    Stroke Sister    Drug abuse Brother    Heart disease Brother    Heart murmur Brother    Colon cancer Neg Hx      ROS:  Please see the history of present illness.   All other ROS reviewed and negative.     Physical Exam/Data:   Vitals:   12/08/22 0417 12/08/22 0755 12/08/22 0841 12/08/22 0934   BP:  115/89    Pulse: (!) 38 88 76 (!) 120  Resp:  18 18   Temp:  98 F (36.7 C)    TempSrc:  Oral    SpO2: 100% 98% 97% 98%  Weight:      Height:        Intake/Output Summary (Last 24 hours) at 12/08/2022 1047 Last data filed at 12/08/2022 1005 Gross per 24 hour  Intake 120 ml  Output 1200 ml  Net -1080 ml      12/08/2022    4:05 AM 12/07/2022    8:00 PM 12/07/2022    3:32 PM  Last 3 Weights  Weight (lbs) 222 lb 10.6 oz 222 lb 3.6 oz 220 lb  Weight (kg) 101 kg 100.8 kg 99.791 kg     Body mass index is 30.2 kg/m.  General:  Well nourished, well developed, in no acute distress HEENT: normal Neck: no JVD Vascular: No carotid bruits; Distal pulses 2+ bilaterally Cardiac: Irregularly irregular; no murmur  Lungs:  clear to auscultation bilaterally, no wheezing, rhonchi or rales  Abd: soft, nontender, no hepatomegaly  Ext: 2+ RLE edema. 1+ LLE edema Musculoskeletal:  No deformities, BUE and BLE strength normal and equal Skin: warm and dry  Neuro:  CNs 2-12 intact, no focal abnormalities noted Psych:  Normal affect   EKG:  The EKG was personally reviewed and demonstrates: Atrial fibrillation with RVR, T wave inversion in the inferior leads Telemetry:  Telemetry was personally reviewed and demonstrates: Normal sinus rhythm, heart rate 100s to 140s.  Relevant CV Studies:  Echo 04/03/2022  1. Left ventricular ejection fraction, by estimation, is 30 to 35%. The  left ventricle has moderately decreased function. The left ventricle  demonstrates global hypokinesis. There is mild left ventricular  hypertrophy. Left ventricular diastolic  parameters are indeterminate.   2. Right ventricular systolic function is mildly reduced. The right  ventricular size is mildly enlarged.   3. Left atrial size was severely dilated.   4. Right atrial size was severely dilated.   5. The mitral valve is  normal in structure. Trivial mitral valve  regurgitation. No evidence of mitral stenosis.    6. Tricuspid valve regurgitation is mild to moderate.   7. The aortic valve is normal in structure. Aortic valve regurgitation is  not visualized. No aortic stenosis is present.   8. The inferior vena cava is dilated in size with <50% respiratory  variability, suggesting right atrial pressure of 15 mmHg.   Comparison(s): Prior images reviewed side by side. The left ventricular  function is worsened. ECHO 2020 - EF 55-60%.     Laboratory Data:  High Sensitivity Troponin:   Recent Labs  Lab 12/07/22 1630 12/07/22 1830  TROPONINIHS 15 18*     Chemistry Recent Labs  Lab 12/07/22 1534 12/07/22 1830 12/08/22 0701  NA 137  --  138  K 3.1*  --  3.5  CL 105  --  103  CO2 24  --  26  GLUCOSE 115*  --  93  BUN 15  --  12  CREATININE 1.29*  --  1.34*  CALCIUM 8.4*  --  8.3*  MG  --  1.9 1.9  GFRNONAA >60  --  60*  ANIONGAP 8  --  9    No results for input(s): "PROT", "ALBUMIN", "AST", "ALT", "ALKPHOS", "BILITOT" in the last 168 hours. Lipids No results for input(s): "CHOL", "TRIG", "HDL", "LABVLDL", "LDLCALC", "CHOLHDL" in the last 168 hours.  Hematology Recent Labs  Lab 12/07/22 1534 12/08/22 0701  WBC 10.7* 8.8  RBC 4.12* 3.94*  HGB 13.3 12.6*  HCT 39.5 37.8*  MCV 95.9 95.9  MCH 32.3 32.0  MCHC 33.7 33.3  RDW 15.3 15.3  PLT 143* 150   Thyroid No results for input(s): "TSH", "FREET4" in the last 168 hours.  BNP Recent Labs  Lab 12/07/22 1534  BNP 231.0*    DDimer No results for input(s): "DDIMER" in the last 168 hours.   Radiology/Studies:  DG Chest Portable 1 View  Result Date: 12/07/2022 CLINICAL DATA:  Dyspnea. EXAM: PORTABLE CHEST 1 VIEW COMPARISON:  04/02/2022 FINDINGS: The cardio pericardial silhouette is enlarged. The lungs are clear without focal pneumonia, edema, pneumothorax or pleural effusion. The visualized bony structures of the thorax are unremarkable. Telemetry leads overlie the chest. IMPRESSION: Enlargement of the cardiopericardial silhouette  without acute cardiopulmonary findings. Electronically Signed   By: Misty Stanley M.D.   On: 12/07/2022 17:04     Assessment and Plan:   Permanent atrial fibrillation with RVR  -Metoprolol, digoxin and Eliquis at home.  Per patient report, he has run out of digoxin for a while and was unable to get a refill.  Will resume home dose of metoprolol succinate 100 mg twice a day.  Received 50 mg metoprolol tartrate yesterday in the ED.  Continue oral digoxin.  Avoid calcium channel blocker given LV dysfunction.  Acute on chronic systolic heart failure: Previously had low EF in 2016, EF normalized by 2020 medical therapy.  Previous heart failure was felt to be due to combination of high blood pressure and tachycardia.  EF dropped down to 30 to 35% by July 2023 after patient arrived with A-fib with RVR and acute heart failure.  Will repeat echocardiogram.  Continue with IV diuresis 80 mg daily.  Right lower extremity swelling is significant compared to the left side, pending venous Doppler.  Hypertension: On Toprol-XL 100 mg twice a day at home.  Will focus on heart rate control at this time, if still has blood pressure room, will consider addition of  ARB/ARNI at a later time.  Consider addition of [place] or Jardiance.  CKD stage III   Risk Assessment/Risk Scores:        New York Heart Association (NYHA) Functional Class NYHA Class III  CHA2DS2-VASc Score = 2   This indicates a 2.2% annual risk of stroke. The patient's score is based upon: CHF History: 1 HTN History: 1 Diabetes History: 0 Stroke History: 0 Vascular Disease History: 0 Age Score: 0 Gender Score: 0         For questions or updates, please contact [place] Please consult [email or web address] for contact info under    Signed, Almyra Deforest, [place]  12/08/2022 10:47 AM  Personally seen and examined. Agree with APP above with the following comments:  Briefly 62 yo M with at least partial medical non adherence with  permanent atrial fibrillation who presents largely in decompensated heart failure. He notes that has ran out of some medication.  He also notes that the lasix does work like it should.  He has had new shortness of breath and LE edema.  He does not weigh himself at home but feels that he has some weigh gain.  He has had A fib RVR that has not resolved on metoprolol and prior digoxin.  He is asymptomatic from the RVR.    Exam notable for holosystolic murmur.  Elevated JVD even supine.  He is CTAB.  He has +2 LE edema R worse than left.    Tele: he is in AF RVR Rates ~ 120s An echo is being performed: LV function is reduced, RV size is dilated, there is a small pericardial effusion at the level of the right atrium.    Would recommend  I suspect his RVR is compensatory from his decompensated HF He is significant volume up - today I have ordered lasix 120 IV BID - I have added 25 mg PO aldactone - if he has issues with diuresis will decrease his trial of metoprolol succinate given today  - once he is euvolemic we can hopefully rate control more sucessfully  Rudean Haskell, MD [place]  [address] [place], [place] [address] [phone]  12:34 PM

## 2022-12-08 NOTE — TOC Progression Note (Signed)
Transition of Care [facility]) - Progression Note    Patient Details  Name: Javier Berry MRN: [id number] Date of Birth: December 29, 1960  Transition of Care [facility]) CM/SW Contact  Zenon Mayo, RN Phone Number: 12/08/2022, 5:07 PM  Clinical Narrative:    from home with wife, CHF, afib RVR in/out vtach, vascular u/s to r/o DVT. TOC following.        Expected Discharge Plan and Services                                               Social Determinants of Health (SDOH) Interventions SDOH Screenings   Food Insecurity: No Food Insecurity (12/07/2022)  Housing: Medium Risk (12/07/2022)  Transportation Needs: No Transportation Needs (12/07/2022)  Utilities: Not At Risk (12/07/2022)  Tobacco Use: Low Risk  (04/02/2022)    Readmission Risk Interventions     No data to display

## 2022-12-08 NOTE — Progress Notes (Signed)
Wife called for update.  Patient consents to all information being shared with wife.  Wife updated and all questions answered.

## 2022-12-09 ENCOUNTER — Encounter (HOSPITAL_COMMUNITY): Payer: Self-pay | Admitting: Family Medicine

## 2022-12-09 DIAGNOSIS — E876 Hypokalemia: Secondary | ICD-10-CM

## 2022-12-09 DIAGNOSIS — I4821 Permanent atrial fibrillation: Secondary | ICD-10-CM | POA: Diagnosis present

## 2022-12-09 DIAGNOSIS — I509 Heart failure, unspecified: Secondary | ICD-10-CM

## 2022-12-09 DIAGNOSIS — R000 Tachycardia, unspecified: Secondary | ICD-10-CM | POA: Diagnosis present

## 2022-12-09 DIAGNOSIS — R0602 Shortness of breath: Secondary | ICD-10-CM | POA: Diagnosis not present

## 2022-12-09 DIAGNOSIS — Z8249 Family history of ischemic heart disease and other diseases of the circulatory system: Secondary | ICD-10-CM | POA: Diagnosis not present

## 2022-12-09 DIAGNOSIS — Z7901 Long term (current) use of anticoagulants: Secondary | ICD-10-CM | POA: Diagnosis not present

## 2022-12-09 DIAGNOSIS — I5023 Acute on chronic systolic (congestive) heart failure: Secondary | ICD-10-CM

## 2022-12-09 DIAGNOSIS — D72828 Other elevated white blood cell count: Secondary | ICD-10-CM | POA: Diagnosis present

## 2022-12-09 DIAGNOSIS — J4489 Other specified chronic obstructive pulmonary disease: Secondary | ICD-10-CM | POA: Diagnosis present

## 2022-12-09 DIAGNOSIS — I4891 Unspecified atrial fibrillation: Secondary | ICD-10-CM

## 2022-12-09 DIAGNOSIS — Z813 Family history of other psychoactive substance abuse and dependence: Secondary | ICD-10-CM | POA: Diagnosis not present

## 2022-12-09 DIAGNOSIS — Z823 Family history of stroke: Secondary | ICD-10-CM | POA: Diagnosis not present

## 2022-12-09 DIAGNOSIS — N1831 Chronic kidney disease, stage 3a: Secondary | ICD-10-CM | POA: Diagnosis present

## 2022-12-09 DIAGNOSIS — Z833 Family history of diabetes mellitus: Secondary | ICD-10-CM | POA: Diagnosis not present

## 2022-12-09 DIAGNOSIS — Z23 Encounter for immunization: Secondary | ICD-10-CM | POA: Diagnosis not present

## 2022-12-09 DIAGNOSIS — I428 Other cardiomyopathies: Secondary | ICD-10-CM | POA: Diagnosis present

## 2022-12-09 DIAGNOSIS — I130 Hypertensive heart and chronic kidney disease with heart failure and stage 1 through stage 4 chronic kidney disease, or unspecified chronic kidney disease: Secondary | ICD-10-CM | POA: Diagnosis present

## 2022-12-09 DIAGNOSIS — Z79899 Other long term (current) drug therapy: Secondary | ICD-10-CM | POA: Diagnosis not present

## 2022-12-09 DIAGNOSIS — M109 Gout, unspecified: Secondary | ICD-10-CM | POA: Diagnosis present

## 2022-12-09 LAB — BASIC METABOLIC PANEL
Anion gap: 8 (ref 5–15)
BUN: 12 mg/dL (ref 8–23)
CO2: 32 mmol/L (ref 22–32)
Calcium: 8.4 mg/dL — ABNORMAL LOW (ref 8.9–10.3)
Chloride: 100 mmol/L (ref 98–111)
Creatinine, Ser: 1.54 mg/dL — ABNORMAL HIGH (ref 0.61–1.24)
GFR, Estimated: 50 mL/min — ABNORMAL LOW (ref >60)
Glucose, Bld: 119 mg/dL — ABNORMAL HIGH (ref 70–99)
Potassium: 3.8 mmol/L (ref 3.5–5.1)
Sodium: 140 mmol/L (ref 135–145)

## 2022-12-09 LAB — CBC
HCT: 41.8 % (ref 39.0–52.0)
Hemoglobin: 14.1 g/dL (ref 13.0–17.0)
MCH: 32 pg (ref 26.0–34.0)
MCHC: 33.7 g/dL (ref 30.0–36.0)
MCV: 95 fL (ref 80.0–100.0)
Platelets: 170 10*3/uL (ref 150–400)
RBC: 4.4 MIL/uL (ref 4.22–5.81)
RDW: 15.1 % (ref 11.5–15.5)
WBC: 9.6 10*3/uL (ref 4.0–10.5)
nRBC: 0 % (ref 0.0–0.2)

## 2022-12-09 LAB — MAGNESIUM: Magnesium: 1.8 mg/dL (ref 1.7–2.4)

## 2022-12-09 LAB — DIGOXIN LEVEL: Digoxin Level: 0.7 ng/mL — ABNORMAL LOW (ref 0.8–2.0)

## 2022-12-09 MED ORDER — POTASSIUM CHLORIDE CRYS ER 20 MEQ PO TBCR
20.0000 meq | EXTENDED_RELEASE_TABLET | Freq: Once | ORAL | Status: AC
  Administered 2022-12-09: 20 meq via ORAL
  Filled 2022-12-09: qty 1

## 2022-12-09 MED ORDER — MAGNESIUM SULFATE IN D5W 1-5 GM/100ML-% IV SOLN
1.0000 g | Freq: Once | INTRAVENOUS | Status: AC
  Administered 2022-12-09: 1 g via INTRAVENOUS
  Filled 2022-12-09: qty 100

## 2022-12-09 MED ORDER — CYCLOBENZAPRINE HCL 5 MG PO TABS
5.0000 mg | ORAL_TABLET | Freq: Three times a day (TID) | ORAL | Status: AC | PRN
  Administered 2022-12-09: 5 mg via ORAL
  Filled 2022-12-09: qty 1

## 2022-12-09 NOTE — Progress Notes (Signed)
Rounding Note    Patient Name: Javier Berry Date of Encounter: 12/09/2022  Reserve Cardiologist: Kirk Ruths, MD    Subjective   62 yo with chronic CHF, afib, HTN, CKD We were asked to see him for Afib with RVR and HCF  He had run out of his Digoxin  HR is well controlled at present  Still has significant leg edema Was still eating bacon at home    Inpatient Medications    Scheduled Meds:  apixaban  5 mg Oral BID   digoxin  0.125 mg Oral Daily   influenza vac split quadrivalent PF  0.5 mL Intramuscular Tomorrow-1000   metoprolol succinate  100 mg Oral BID   spironolactone  25 mg Oral Daily   Continuous Infusions:  sodium chloride Stopped (12/08/22 1825)   furosemide 120 mg (12/09/22 0852)   magnesium sulfate bolus IVPB     PRN Meds: sodium chloride, acetaminophen **OR** acetaminophen, cyclobenzaprine, fluticasone furoate-vilanterol, ipratropium   Vital Signs    Vitals:   12/08/22 2151 12/08/22 2326 12/09/22 0305 12/09/22 0736  BP: (!) 119/93 (!) 118/59 99/63 113/66  Pulse:  98  73  Resp:  18  18  Temp:  97.8 F (36.6 C) 98.2 F (36.8 C) 98.9 F (37.2 C)  TempSrc:  Oral  Axillary  SpO2:  93% 98% 98%  Weight:   95.8 kg   Height:        Intake/Output Summary (Last 24 hours) at 12/09/2022 0956 Last data filed at 12/09/2022 0739 Gross per 24 hour  Intake 670.61 ml  Output 7350 ml  Net -6679.39 ml      12/09/2022    3:05 AM 12/08/2022    4:05 AM 12/07/2022    8:00 PM  Last 3 Weights  Weight (lbs) 211 lb 3.2 oz 222 lb 10.6 oz 222 lb 3.6 oz  Weight (kg) 95.8 kg 101 kg 100.8 kg      Telemetry    Atrial fib with controlled V response  - Personally Reviewed  ECG     - Personally Reviewed  Physical Exam   GEN: No acute distress.   Neck: No JVD Cardiac: Irreg. Irreg.  Respiratory: Clear to auscultation bilaterally. GI: Soft, nontender, non-distended  MS: 2-3 + pitting edema  Neuro:  Nonfocal  Psych: Normal affect   Labs     High Sensitivity Troponin:   Recent Labs  Lab 12/07/22 1630 12/07/22 1830  TROPONINIHS 15 18*     Chemistry Recent Labs  Lab 12/07/22 1534 12/07/22 1830 12/08/22 0701 12/09/22 0051  NA 137  --  138 140  K 3.1*  --  3.5 3.8  CL 105  --  103 100  CO2 24  --  26 32  GLUCOSE 115*  --  93 119*  BUN 15  --  12 12  CREATININE 1.29*  --  1.34* 1.54*  CALCIUM 8.4*  --  8.3* 8.4*  MG  --  1.9 1.9 1.8  GFRNONAA >60  --  60* 50*  ANIONGAP 8  --  9 8    Lipids No results for input(s): "CHOL", "TRIG", "HDL", "LABVLDL", "LDLCALC", "CHOLHDL" in the last 168 hours.  Hematology Recent Labs  Lab 12/07/22 1534 12/08/22 0701 12/09/22 0051  WBC 10.7* 8.8 9.6  RBC 4.12* 3.94* 4.40  HGB 13.3 12.6* 14.1  HCT 39.5 37.8* 41.8  MCV 95.9 95.9 95.0  MCH 32.3 32.0 32.0  MCHC 33.7 33.3 33.7  RDW 15.3 15.3 15.1  PLT 143* 150 170   Thyroid No results for input(s): "TSH", "FREET4" in the last 168 hours.  BNP Recent Labs  Lab 12/07/22 1534  BNP 231.0*    DDimer No results for input(s): "DDIMER" in the last 168 hours.   Radiology    VAS [name] LOWER EXTREMITY VENOUS (DVT)  Result Date: 12/08/2022  Lower Venous DVT Study Patient Name:  Javier Berry  Date of Exam:   12/08/2022 Medical Rec #: [id number]       Accession #:    [id number] Date of Birth: 1961/09/22       Patient Gender: M Patient Age:   9 years Exam Location:  [facility] Procedure:      VAS [name] LOWER EXTREMITY VENOUS (DVT) Referring Phys: TODD MCDIARMID --------------------------------------------------------------------------------  Indications: Edema, right greater than left.  Risk Factors: Acute on chronic CHF. Anticoagulation: Eliquis. Comparison Study: No prior studies. Performing Technologist: Darlin Coco RDMS, RVT  Examination Guidelines: A complete evaluation includes B-mode imaging, spectral Doppler, color Doppler, and power Doppler as needed of all accessible portions of each vessel. Bilateral testing is considered an  integral part of a complete examination. Limited examinations for reoccurring indications may be performed as noted. The reflux portion of the exam is performed with the patient in reverse Trendelenburg.  +---------+---------------+---------+-----------+----------+--------------+ RIGHT    CompressibilityPhasicitySpontaneityPropertiesThrombus Aging +---------+---------------+---------+-----------+----------+--------------+ CFV      Full           Yes      Yes                                 +---------+---------------+---------+-----------+----------+--------------+ SFJ      Full                                                        +---------+---------------+---------+-----------+----------+--------------+ FV Prox  Full                                                        +---------+---------------+---------+-----------+----------+--------------+ FV Mid   Full                                                        +---------+---------------+---------+-----------+----------+--------------+ FV DistalFull                                                        +---------+---------------+---------+-----------+----------+--------------+ PFV      Full                                                        +---------+---------------+---------+-----------+----------+--------------+ POP  Full           Yes      Yes                                 +---------+---------------+---------+-----------+----------+--------------+ PTV      Full                                                        +---------+---------------+---------+-----------+----------+--------------+ PERO     Full                                                        +---------+---------------+---------+-----------+----------+--------------+   +----+---------------+---------+-----------+----------+--------------+ LEFTCompressibilityPhasicitySpontaneityPropertiesThrombus Aging  +----+---------------+---------+-----------+----------+--------------+ CFV Full           Yes      Yes                                 +----+---------------+---------+-----------+----------+--------------+     Summary: RIGHT: - There is no evidence of deep vein thrombosis in the lower extremity.  - No cystic structure found in the popliteal fossa.  LEFT: - No evidence of common femoral vein obstruction.  *See table(s) above for measurements and observations. Electronically signed by Orlie Pollen on 12/08/2022 at 6:03:16 PM.    Final    ECHOCARDIOGRAM COMPLETE  Result Date: 12/08/2022    ECHOCARDIOGRAM REPORT   Patient Name:   Javier Berry Date of Exam: 12/08/2022 Medical Rec #:  [id number]      Height:       72.0 in Accession #:    [id number]     Weight:       222.7 lb Date of Birth:  Nov 10, 1960      BSA:          2.230 m Patient Age:    57 years       BP:           123/73 mmHg Patient Gender: M              HR:           113 bpm. Exam Location:  Inpatient Procedure: 2D Echo, 3D Echo, Strain Analysis, Cardiac Doppler and Color Doppler Indications:    CHF-Acute Systolic [id number]  History:        Patient has prior history of Echocardiogram examinations, most                 recent 04/03/2022. CHF, Arrythmias:Atrial Fibrillation,                 Signs/Symptoms:Shortness of Breath and Edema; Risk                 Factors:Hypertension.  Sonographer:    Wilkie Aye RVT RCS Referring Phys: [id number] [place]  1. There is severe global hypokinesis of the LV with relative sparing of the inferolateral wall. Left ventricular ejection fraction, by estimation, is 25 to 30%. The left ventricle has severely decreased function. The left ventricle demonstrates global hypokinesis. There is mild concentric left ventricular hypertrophy.  Left ventricular diastolic function could not be evaluated.  2. Right ventricular systolic function is hyperdynamic. The right ventricular size is normal. There is mildly elevated  pulmonary artery systolic pressure.  3. Left atrial size was moderately dilated.  4. Right atrial size was severely dilated.  5. The mitral valve is normal in structure. No evidence of mitral valve regurgitation. No evidence of mitral stenosis.  6. Tricuspid valve regurgitation is moderate.  7. The aortic valve is normal in structure. Aortic valve regurgitation is mild. No aortic stenosis is present.  8. The inferior vena cava is dilated in size with <50% respiratory variability, suggesting right atrial pressure of 15 mmHg. FINDINGS  Left Ventricle: There is severe global hypokinesis of the LV with relative sparing of the inferolateral wall. Left ventricular ejection fraction, by estimation, is 25 to 30%. The left ventricle has severely decreased function. The left ventricle demonstrates global hypokinesis. The left ventricular internal cavity size was normal in size. There is mild concentric left ventricular hypertrophy. Left ventricular diastolic function could not be evaluated due to atrial fibrillation. Left ventricular diastolic function could not be evaluated. Right Ventricle: The right ventricular size is normal. No increase in right ventricular wall thickness. Right ventricular systolic function is hyperdynamic. There is mildly elevated pulmonary artery systolic pressure. The tricuspid regurgitant velocity is 2.26 m/s, and with an assumed right atrial pressure of 15 mmHg, the estimated right ventricular systolic pressure is [id number] mmHg. Left Atrium: Left atrial size was moderately dilated. Right Atrium: Right atrial size was severely dilated. Pericardium: Trivial pericardial effusion is present. The pericardial effusion is localized near the right atrium. Mitral Valve: The mitral valve is normal in structure. No evidence of mitral valve regurgitation. No evidence of mitral valve stenosis. Tricuspid Valve: The tricuspid valve is normal in structure. Tricuspid valve regurgitation is moderate . No evidence of  tricuspid stenosis. Aortic Valve: The aortic valve is normal in structure. Aortic valve regurgitation is mild. No aortic stenosis is present. Aortic valve mean gradient measures 3.3 mmHg. Aortic valve peak gradient measures 6.3 mmHg. Aortic valve area, by VTI measures 3.72 cm. Pulmonic Valve: The pulmonic valve was normal in structure. Pulmonic valve regurgitation is not visualized. No evidence of pulmonic stenosis. Aorta: The aortic root is normal in size and structure. Venous: The inferior vena cava is dilated in size with less than 50% respiratory variability, suggesting right atrial pressure of 15 mmHg. IAS/Shunts: No atrial level shunt detected by color flow Doppler.  LEFT VENTRICLE PLAX 2D LVIDd:         5.60 cm LVIDs:         4.50 cm LV PW:         1.40 cm LV IVS:        0.90 cm LVOT diam:     2.10 cm   3D Volume EF: LV SV:         66        3D EF:        46 % LV SV Index:   30        LV EDV:       177 ml LVOT Area:     3.46 cm  LV ESV:       96 ml                          LV SV:        81 ml RIGHT VENTRICLE  IVC RV Basal diam:  4.50 cm  IVC diam: 2.00 cm RV Mid diam:    4.00 cm LEFT ATRIUM              Index        RIGHT ATRIUM           Index LA diam:        4.90 cm  2.20 cm/m   RA Area:     38.50 cm LA Vol (A2C):   119.0 ml 53.36 ml/m  RA Volume:   169.00 ml 75.78 ml/m LA Vol (A4C):   98.2 ml  44.04 ml/m LA Biplane Vol: 116.0 ml 52.02 ml/m  AORTIC VALVE                    PULMONIC VALVE AV Area (Vmax):    3.78 cm     PV Vmax:       1.05 m/s AV Area (Vmean):   3.69 cm     PV Peak grad:  4.4 mmHg AV Area (VTI):     3.72 cm AV Vmax:           125.67 cm/s AV Vmean:          83.200 cm/s AV VTI:            0.177 m AV Peak Grad:      6.3 mmHg AV Mean Grad:      3.3 mmHg LVOT Vmax:         137.00 cm/s LVOT Vmean:        88.567 cm/s LVOT VTI:          0.190 m LVOT/AV VTI ratio: 1.08  AORTA Ao Root diam: 2.70 cm Ao Asc diam:  3.30 cm MITRAL VALVE               TRICUSPID VALVE MV Area (PHT): 3.46  cm    TR Peak grad:   20.4 mmHg MV Decel Time: 220 msec    TR Vmax:        226.00 cm/s MV E velocity: 68.80 cm/s                            SHUNTS                            Systemic VTI:  0.19 m                            Systemic Diam: 2.10 cm Glori Bickers MD Electronically signed by Glori Bickers MD Signature Date/Time: 12/08/2022/1:08:08 PM    Final    DG Chest Portable 1 View  Result Date: 12/07/2022 CLINICAL DATA:  Dyspnea. EXAM: PORTABLE CHEST 1 VIEW COMPARISON:  04/02/2022 FINDINGS: The cardio pericardial silhouette is enlarged. The lungs are clear without focal pneumonia, edema, pneumothorax or pleural effusion. The visualized bony structures of the thorax are unremarkable. Telemetry leads overlie the chest. IMPRESSION: Enlargement of the cardiopericardial silhouette without acute cardiopulmonary findings. Electronically Signed   By: Misty Stanley M.D.   On: 12/07/2022 17:04    Cardiac Studies     Patient Profile     62 y.o. male    Assessment & Plan      Atrial fib :   HR is better at this point ,  cont. Eliquis  Continue metoprolol and Digoxin for rate  control  2.  Acute on chronic systolic CHF.     Has diuresed 7.2 liters.  Still has evidence of volume overload .  Continue lasix 120 IV BID ,  spironolactone 25 mg a day   EF is 25-30%.    3.         For questions or updates, please contact [place] Please consult [email or web address] for contact info under        Signed, Mertie Moores, MD  12/09/2022, 9:56 AM

## 2022-12-09 NOTE — Progress Notes (Signed)
     Daily Progress Note Intern Pager: [phone]  Patient name: Javier Berry Medical record number: [id number] Date of birth: 02/18/1961 Age: 62 y.o. Gender: male  Primary Care Provider: Pcp, No Consultants: Cardiology  Code Status: Full code   Pt Overview and Major Events to Date:  3/10: Admitted   Assessment and Plan:  Javier Berry is a 62 y.o. male presenting with lower extremity edema and exertional dyspnea. Pertinent PMH/PSH includes HFrEF, A fib, HTN. CKD3, Gout, NICM, PUD.   Great UOP yesterday. Kidney function stable on Lasix 120 mg/BID. Still volume overloaded on exam. Patient agreeable to becoming a new patient at [facility]. Will coordinate with office to set up outpatient follow up once closer to DC.   * Acute on chronic systolic CHF (congestive heart failure) (HCC) Weight 101 kg > 95.8 kg. UOP - 6.7 L 3/11. Echo showing worsening LVEF 25-30% w/ severe global hypokinesis.  U/S neg for DVT.  - daily weight - strict I&Os  HF Medications:   - Metoprolol 100 mg BID starting 3/11  - Lasix 120 mg IV BID (3/11)  - Aldactone 25 mg (3/11)  - consider adding Entresto   - consider adding SGLT2i  - Flexeril 5 mg TID PRN for leg cramping and pain, optimizing electrolytes   Atrial fibrillation with RVR (HCC) Rate controlled. HR 80-90.  - cardiology consulted, appreciate recommendations.  - Toprol 100 mg BID - Digoxin 0.125 mg daily   Hypokalemia K 3.8 - recheck BMP daily  - Goal K >4, Mag > 2 - ordered 20 mEq K-lor tablet  - ordered 1 g Mag replacement   SOB (shortness of breath) on exertion - Atrovent nebs - cont Breo  - PFTs outpatient   CKD (chronic kidney disease) stage 3, GFR 30-59 ml/min (HCC) Cr 1.54; baseline appears to be ~1.5-1.7 - daily BMP    FEN/GI: heart healthy, fluid restriction 1800 mL  PPx: Eliquis  Dispo:Home pending clinical improvement . Barriers include ongoing medical management requiring IV drugs.   Subjective:  NAEO, patient  resting comfortably.   Objective: Temp:  [97.8 F (36.6 C)-99.5 F (37.5 C)] 98.9 F (37.2 C) (03/12 0736) Pulse Rate:  [73-120] 73 (03/12 0736) Resp:  [18] 18 (03/12 0736) BP: (99-122)/(59-93) 113/66 (03/12 0736) SpO2:  [93 %-98 %] 98 % (03/12 0736) Weight:  [95.8 kg] 95.8 kg (03/12 0305) Physical Exam: Well-appearing, no acute distress Cardio: Regular rate, irregular rhythm, no murmurs on exam. Pulm: Clear, no wheezing, no crackles. No increased work of breathing Abdominal: bowel sounds present, soft, non-tender, non-distended Extremities: +2 pitting edema   Laboratory: Most recent CBC Lab Results  Component Value Date   WBC 9.6 12/09/2022   HGB 14.1 12/09/2022   HCT 41.8 12/09/2022   MCV 95.0 12/09/2022   PLT 170 12/09/2022   Most recent BMP    Latest Ref Rng & Units 12/09/2022   12:51 AM  BMP  Glucose 70 - 99 mg/dL 119   BUN 8 - 23 mg/dL 12   Creatinine 0.61 - 1.24 mg/dL 1.54   Sodium 135 - 145 mmol/L 140   Potassium 3.5 - 5.1 mmol/L 3.8   Chloride 98 - 111 mmol/L 100   CO2 22 - 32 mmol/L 32   Calcium 8.9 - 10.3 mg/dL 8.4    Darci Current, DO 12/09/2022, 9:09 AM  PGY-1, [place] Intern pager: [phone], text pages welcome Secure chat group [place]

## 2022-12-09 NOTE — Progress Notes (Addendum)
   Heart Failure Stewardship Pharmacist Progress Note   PCP: Pcp, No PCP-Cardiologist: Kirk Ruths, MD    HPI:  62 yo M with PMH of HTN, CHF, CKD, and afib.   Presented to the ED on 3/10 with LE edema, shortness of breath, weight gain, with cough and congestion. Reports he has been off digoxin for several months but was still taking metoprolol and Elliquis. In afib RVR on arrival. CXR with enlargement of the cardiopericardial silhouette without acute cardiopulmonary findings. EF was 35% in 2016 and 2018, improved to 55-60% in 2020, back down to 30-35% in 2023 after being lost to follow up with cardiology and was no longer taking meds. ECHO this admission showed LVEF 25-30%, global hypokinesis, mild concentric LVH, RV hyperdynamic, and mildly elevated PA pressures.  Current HF Medications: Diuretic: furosemide 120 mg IV BID Beta Blocker: metoprolol XL 100 mg BID MRA: spironolactone 25 mg daily Other: digoxin 0.125 mg daily  Prior to admission HF Medications: Diuretic: furosemide 40 mg daily Beta blocker: metoprolol XL 25 mg daily  Pertinent Lab Values: Serum creatinine 1.54, BUN 12, Potassium 3.8, Sodium 140, BNP 231, Magnesium 1.8, Digoxin level 0.7  Vital Signs: Weight: 211 lbs (admission weight: 222 lbs) Blood pressure: 100-110/60s  Heart rate: 80s - afib I/O: -6.1L yesterday; net -7.2L  Medication Assistance / Insurance Benefits Check: Does the patient have prescription insurance?  Yes Type of insurance plan: [place] state health plan  Outpatient Pharmacy:  Prior to admission outpatient pharmacy: [facility] Is the patient willing to use [facility] at discharge? Yes Is the patient willing to transition their outpatient pharmacy to utilize a [facility] outpatient pharmacy?   Pending    Assessment: 1. Acute on chronic systolic CHF (LVEF 35-00%), due to NICM. NYHA class III symptoms. - Continue furosemide 120 mg IV BID. Strict I/Os and daily weights. Keep K>4 and  Mg>2. KCl 20 mEq and Mg 1 g IV x 1 given for replacement. - Continue metoprolol XL 100 mg BID, will need to monitor urine output closely to ensure he is not in low output heart failure, it appears he was only taking 25 mg daily prior to admission. - Continue spironolactone 25 mg daily - Received digoxin 0.25 mg IV on 3/10 and 3/11. Digoxin 0.125 mg daily ordered to start 3/11. Level on 3/12 was ok 0.7. Continue digoxin 0.125 mg daily. Recommend checking digoxin level in 5 days     Plan: 1) Medication changes recommended at this time: - Continue current regimen - May need to reduce metoprolol dose if there are signs of low output  2) Patient assistance: - Farxiga copay $47 - copay card lowers to $0 per month  3)  Education  - To be completed prior to discharge  Kerby Nora, PharmD, BCPS Heart Failure Stewardship Pharmacist Phone [phone]

## 2022-12-09 NOTE — Progress Notes (Signed)
Heart Failure Nurse Navigator Progress Note  PCP: Pcp, No PCP-Cardiologist: [place] Admission Diagnosis: None Admitted from: Home  Presentation:   Reather Converse presented with cough and congestion x 1 month, increase in leg swelling x 3 weeks. Reported to taking lasix as prescribed, however, stopped taking his digoxin a few months ago. BP 135/111, HR 81, 4+ BLE, BNP 231, EKG in afib RVR, CXR with enlargement of the cardio pericardial silhouette with out acute cardiopulmonary .  Patient was educated on the sign and symptoms of heart failure, daily weights, when to call his doctor or go to the ED, Diet/ fluids, patient reported to eating bacon and can veggies, spoke about taking all medications as prescribed and attending medical appointments. Patient verbalized his understanding, a HF TOC appointment was scheduled for 12/29/2022 @ 12 noon.  ECHO/ LVEF: 25-30%   Clinical Course:  Past Medical History:  Diagnosis Date   Chronic systolic CHF (congestive heart failure) ([place])    a. 01/2015 Echo: EF 35-40%;  b. 10/2016 Echo: EF 30-35%, diff HK, mild MR, sev dil LA/RA, mod TR, PASP 17mHg.   CKD (chronic kidney disease), stage III ([place]    Essential hypertension    a. with h/o HTN urgency in the setting of noncompliance.   Gout    NICM (nonischemic cardiomyopathy) ([facility]    a. 01/2015 Echo: EF 35-40%;  b. 01/2015 Myoview: EF 30-44%, no ischemia/infarct;  c. 10/2016 Echo: EF 30-35%, diff HK.   Persistent atrial fibrillation ([place]    a. CHA2DS2VASc = 2-->Eliquis;  b. s/p failed DCCV in 11/2013;  c. 10/2016 Echo: severe biatrial enlargement.   PUD (peptic ulcer disease)      Social History   Socioeconomic History   Marital status: Married    Spouse name: Hieu   Number of children: 3   Years of education: Not on file   Highest education level: High school graduate  Occupational History    Employer: [employer]  Occupation: retired bRecruitment consultant Tobacco Use   Smoking status: Never    Smokeless tobacco: Never  Vaping Use   Vaping Use: Never used  Substance and Sexual Activity   Alcohol use: No    Comment: Occasional   Drug use: No   Sexual activity: Not on file  Other Topics Concern   Not on file  Social History Narrative   Not on file   Social Determinants of Health   Financial Resource Strain: Low Risk  (12/09/2022)   Overall Financial Resource Strain (CARDIA)    Difficulty of Paying Living Expenses: Not very hard  Food Insecurity: No Food Insecurity (12/07/2022)   Hunger Vital Sign    Worried About Running Out of Food in the Last Year: Never true    Ran Out of Food in the Last Year: Never true  Transportation Needs: No Transportation Needs (12/07/2022)   PRAPARE - THydrologist(Medical): No    Lack of Transportation (Non-Medical): No  Physical Activity: Not on file  Stress: Not on file  Social Connections: Not on file   Education Assessment and Provision:  Detailed education and instructions provided on heart failure disease management including the following:  Signs and symptoms of Heart Failure When to call the physician Importance of daily weights Low sodium diet Fluid restriction Medication management Anticipated future follow-up appointments  Patient education given on each of the above topics.  Patient acknowledges understanding via teach back method and acceptance of all instructions.  Education Materials:  "Living Better With Heart Failure" Booklet, HF zone tool, & Daily Weight Tracker Tool.  Patient has scale at home: yes Patient has pill box at home: yes    High Risk Criteria for Readmission and/or Poor Patient Outcomes: Heart failure hospital admissions (last 6 months): 1  No Show rate: 26 % Difficult social situation: no Demonstrates medication adherence: No, stopped taking his digoxin for several months.  Primary Language: English Literacy level: Reading, writing, and comprehension.   Barriers of  Care:   Medication compliance Diet/ fluids/ daily weights ( reported eating bacon)   Considerations/Referrals:   Referral made to Heart Failure Pharmacist Stewardship: yes Referral made to Heart Failure CSW/NCM TOC: NO Referral made to Heart & Vascular TOC clinic: Yes, 12/29/2022 @ 12 noon.   Items for Follow-up on DC/TOC: Medication compliance Diet/ fluids/ daily weights Berniece Salines)  Continued HF education   Earnestine Leys, BSN, RN Heart Failure Leisure centre manager Chat Only

## 2022-12-09 NOTE — Evaluation (Signed)
Physical Therapy Evaluation Patient Details Name: Javier Berry MRN: [id number] DOB: [birth year] Today's Date: 12/09/2022  History of Present Illness  Pt is 62 yo male who presents on 12/07/22 with  cough and congestion x 1 month, increase in leg swelling x 3 weeks. Reported to taking lasix as prescribed, however, stopped taking his digoxin a few months ago. Pt in Afib with RVR. PMH: CHF, CKD3, HTN, gout, NICM, Afib, PUD  Clinical Impression  Pt admitted with above diagnosis. Pt is from home with his wife and drives a school bus part time. He needs to be able to get up 4 steps to get into home and has no equipment. On eval, pt unable to lift RLE independently. Mod A needed to come to EOB and stand with use of RW. Pt ambulated 15' with RW and min A. Recommend HHPT at d/c.  Pt currently with functional limitations due to the deficits listed below (see PT Problem List). Pt will benefit from skilled PT to increase their independence and safety with mobility to allow discharge to the venue listed below.          Recommendations for follow up therapy are one component of a multi-disciplinary discharge planning process, led by the attending physician.  Recommendations may be updated based on patient status, additional functional criteria and insurance authorization.  Follow Up Recommendations Home health PT      Assistance Recommended at Discharge Intermittent Supervision/Assistance  Patient can return home with the following  A little help with walking and/or transfers;A little help with bathing/dressing/bathroom;Assistance with cooking/housework;Assist for transportation;Help with stairs or ramp for entrance    Equipment Recommendations Rolling walker (2 wheels)  Recommendations for Other Services  OT consult    Functional Status Assessment Patient has had a recent decline in their functional status and demonstrates the ability to make significant improvements in function in a reasonable and  predictable amount of time.     Precautions / Restrictions Precautions Precautions: Fall Restrictions Weight Bearing Restrictions: No      Mobility  Bed Mobility Overal bed mobility: Needs Assistance Bed Mobility: Supine to Sit     Supine to sit: Mod assist     General bed mobility comments: mod A to RLE    Transfers Overall transfer level: Needs assistance Equipment used: Rolling walker (2 wheels) Transfers: Sit to/from Stand Sit to Stand: Mod assist, From elevated surface           General transfer comment: multimodal cues for hand and foot placement. Mod A for power up and to steady.    Ambulation/Gait Ambulation/Gait assistance: Min assist Gait Distance (Feet): 15 Feet Assistive device: Rolling walker (2 wheels) Gait Pattern/deviations: Step-to pattern, Antalgic, Decreased weight shift to right Gait velocity: decreased Gait velocity interpretation: <1.31 ft/sec, indicative of household ambulator   General Gait Details: R knee maintained in flexion, no buckling noted but pt taking most wt through his arms when stepping LLE  Stairs            Wheelchair Mobility    Modified Rankin (Stroke Patients Only)       Balance Overall balance assessment: Needs assistance Sitting-balance support: Feet supported, No upper extremity supported Sitting balance-Leahy Scale: Good     Standing balance support: No upper extremity supported, During functional activity Standing balance-Leahy Scale: Fair Standing balance comment: is able to maintain static stance without UE support, but reliant on AD for dynamic  Pertinent Vitals/Pain Pain Assessment Pain Assessment: Faces Faces Pain Scale: Hurts even more Pain Location: RLE, esp below the knee Pain Descriptors / Indicators: Aching, Cramping Pain Intervention(s): Limited activity within patient's tolerance, Monitored during session    Home Living Family/patient expects  to be discharged to:: Private residence Living Arrangements: Spouse/significant other;Children Available Help at Discharge: Family;Available 24 hours/day Type of Home: House Home Access: Stairs to enter Entrance Stairs-Rails: Psychiatric nurse of Steps: 4 Alternate Level Stairs-Number of Steps: flight Home Layout: Two level;1/2 bath on main level (has a bedroom on main level) Home Equipment: None Additional Comments: pt lives with wife, teenage son, and mother in [employer]. 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 Hip flex 2/5, hip add 2/5, knee ext 2/5, ankle 3/5 RLE Sensation: decreased proprioception RLE Coordination: decreased gross motor LLE Deficits / Details: hip flex 3/5, knee ext 3/5, has neoprene brace for L knee LLE Sensation: WNL LLE Coordination: WNL    Cervical / Trunk Assessment Cervical / Trunk Assessment: Normal  Communication   Communication: No difficulties  Cognition Arousal/Alertness: Awake/alert Behavior During Therapy: WFL for tasks assessed/performed Overall Cognitive Status: Within Functional Limits for tasks assessed                                          General Comments General comments (skin integrity, edema, etc.): HR up to 117 bpm with ambulation, 90's after session, SPO2 in 90's throughout on RA    Exercises General Exercises -  Lower Extremity Long Arc Quad: AROM, AAROM, Both, 10 reps, Seated Hip Flexion/Marching: AROM, Both, 10 reps, AAROM, Seated   Assessment/Plan    PT Assessment Patient needs continued PT services  PT Problem List Decreased strength;Decreased range of motion;Decreased activity tolerance;Decreased balance;Decreased mobility;Decreased coordination;Decreased knowledge of use of DME;Pain;Cardiopulmonary status limiting activity       PT Treatment Interventions DME instruction;Gait training;Stair training;Functional mobility training;Therapeutic activities;Therapeutic exercise;Balance training;Neuromuscular re-education;Patient/family education    PT Goals (Current goals can be found in the Care Plan section)  Acute Rehab PT Goals Patient Stated Goal: return to home and work PT Goal Formulation: With patient/family Time For Goal Achievement: 12/23/22 Potential to Achieve Goals: Good    Frequency Min 3X/week     Co-evaluation               AM-PAC PT "6 Clicks" Mobility  Outcome Measure Help needed turning from your back to your side while in a flat bed without using bedrails?: A Little Help needed moving from lying on your back to sitting on the side of a flat bed without using bedrails?: A Lot Help needed moving to and from a bed to a chair (including a wheelchair)?: A Lot Help needed standing up from a chair using your arms (e.g., wheelchair or bedside chair)?: A Lot Help needed  to walk in hospital room?: A Little Help needed climbing 3-5 steps with a railing? : Total 6 Click Score: 13    End of Session Equipment Utilized During Treatment: Gait belt Activity Tolerance: Patient tolerated treatment well Patient left: in bed;with call bell/phone within reach;with family/visitor present;with bed alarm set Nurse Communication: Mobility status PT Visit Diagnosis: Unsteadiness on feet (R26.81);Muscle weakness (generalized) (M62.81);Difficulty in walking, not elsewhere classified  (R26.2);Pain Pain - Right/Left: Right Pain - part of body: Leg    Time: [id number] PT Time Calculation (min) (ACUTE ONLY): 36 min   Charges:   PT Evaluation $PT Eval Moderate Complexity: 1 Mod PT Treatments $Gait Training: 8-22 mins        Leighton Roach, PT  Acute Rehab Services Secure chat preferred Office [place] 12/09/2022, 4:45 PM

## 2022-12-10 ENCOUNTER — Inpatient Hospital Stay (HOSPITAL_COMMUNITY): Payer: BC Managed Care – PPO

## 2022-12-10 DIAGNOSIS — I5023 Acute on chronic systolic (congestive) heart failure: Secondary | ICD-10-CM | POA: Diagnosis not present

## 2022-12-10 LAB — BASIC METABOLIC PANEL
Anion gap: 8 (ref 5–15)
BUN: 13 mg/dL (ref 8–23)
CO2: 28 mmol/L (ref 22–32)
Calcium: 8.3 mg/dL — ABNORMAL LOW (ref 8.9–10.3)
Chloride: 100 mmol/L (ref 98–111)
Creatinine, Ser: 1.47 mg/dL — ABNORMAL HIGH (ref 0.61–1.24)
GFR, Estimated: 53 mL/min — ABNORMAL LOW (ref >60)
Glucose, Bld: 124 mg/dL — ABNORMAL HIGH (ref 70–99)
Potassium: 3.5 mmol/L (ref 3.5–5.1)
Sodium: 136 mmol/L (ref 135–145)

## 2022-12-10 LAB — MAGNESIUM: Magnesium: 1.9 mg/dL (ref 1.7–2.4)

## 2022-12-10 MED ORDER — POTASSIUM CHLORIDE CRYS ER 20 MEQ PO TBCR
40.0000 meq | EXTENDED_RELEASE_TABLET | Freq: Once | ORAL | Status: AC
  Administered 2022-12-10: 40 meq via ORAL
  Filled 2022-12-10: qty 2

## 2022-12-10 MED ORDER — MAGNESIUM SULFATE IN D5W 1-5 GM/100ML-% IV SOLN
1.0000 g | Freq: Once | INTRAVENOUS | Status: AC
  Administered 2022-12-10: 1 g via INTRAVENOUS
  Filled 2022-12-10: qty 100

## 2022-12-10 NOTE — Progress Notes (Signed)
Physical Therapy Treatment Patient Details Name: Javier Berry MRN: [id number] DOB: [birth year] Today's Date: 12/10/2022   History of Present Illness Pt is 61 yo male who presents on 12/07/22 with  cough and congestion x 1 month, increase in leg swelling x 3 weeks. Reported to taking lasix as prescribed, however, stopped taking his digoxin a few months ago. Pt in Afib with RVR. PMH: CHF, CKD3, HTN, gout, NICM, Afib, PUD    PT Comments    Pt was seen for gait with longer trip today total and definite stability concerns with R knee impacting safety.  Pt requires extra time and assist to get through first walk, and was better upon return although needs time to "set" the knee to sit.  Follow up to get him more stable, to increase control of knee and will hopefully be able to see with family to make them familiar with the issues of control of knee.  Acute therapy goals are prepared for follow up, focusing on strength and balance as well as management of the knee pain.   Recommendations for follow up therapy are one component of a multi-disciplinary discharge planning process, led by the attending physician.  Recommendations may be updated based on patient status, additional functional criteria and insurance authorization.  Follow Up Recommendations  Home health PT     Assistance Recommended at Discharge Intermittent Supervision/Assistance  Patient can return home with the following A little help with walking and/or transfers;A little help with bathing/dressing/bathroom;Assistance with cooking/housework;Assist for transportation;Help with stairs or ramp for entrance   Equipment Recommendations  Rolling walker (2 wheels)    Recommendations for Other Services OT consult     Precautions / Restrictions Precautions Precautions: Fall Restrictions Weight Bearing Restrictions: No     Mobility  Bed Mobility Overal bed mobility: Needs Assistance Bed Mobility: Supine to Sit, Sit to Supine      Supine to sit: Min assist, Mod assist Sit to supine: Mod assist   General bed mobility comments: mod to assist legs    Transfers Overall transfer level: Needs assistance Equipment used: Rolling walker (2 wheels) Transfers: Sit to/from Stand Sit to Stand: Mod assist, From elevated surface           General transfer comment: mod to power up    Ambulation/Gait Ambulation/Gait assistance: Min assist Gait Distance (Feet): 30 Feet (15 x 2) Assistive device: Rolling walker (2 wheels) Gait Pattern/deviations: Step-to pattern, Step-through pattern, Antalgic, Decreased stride length, Decreased weight shift to right Gait velocity: decreased Gait velocity interpretation: <1.31 ft/sec, indicative of household ambulator Pre-gait activities: standing balance ck General Gait Details: pt is slow and struggling to stay upright, needs time to get control of R knee in standing   Stairs             Wheelchair Mobility    Modified Rankin (Stroke Patients Only)       Balance Overall balance assessment: Needs assistance Sitting-balance support: Feet supported Sitting balance-Leahy Scale: Good     Standing balance support: During functional activity, Bilateral upper extremity supported Standing balance-Leahy Scale: Fair Standing balance comment: cannot balance without UE support                            Cognition Arousal/Alertness: Awake/alert Behavior During Therapy: WFL for tasks assessed/performed Overall Cognitive Status: Within Functional Limits for tasks assessed  Exercises      General Comments General comments (skin integrity, edema, etc.): Pt is up to move but quite worrisome today, with knee stability requiring close guard with 2 to make first trip then could manage with one on return      Pertinent Vitals/Pain Pain Assessment Pain Assessment: Faces Faces Pain Scale: Hurts little  more Pain Location: R knee    Home Living                          Prior Function            PT Goals (current goals can now be found in the care plan section) Acute Rehab PT Goals Patient Stated Goal: return to home and work Progress towards PT goals: Progressing toward goals    Frequency    Min 3X/week      PT Plan Current plan remains appropriate    Co-evaluation              AM-PAC PT "6 Clicks" Mobility   Outcome Measure  Help needed turning from your back to your side while in a flat bed without using bedrails?: A Little Help needed moving from lying on your back to sitting on the side of a flat bed without using bedrails?: A Lot Help needed moving to and from a bed to a chair (including a wheelchair)?: A Lot Help needed standing up from a chair using your arms (e.g., wheelchair or bedside chair)?: Total Help needed to walk in hospital room?: A Lot Help needed climbing 3-5 steps with a railing? : Total 6 Click Score: 11    End of Session Equipment Utilized During Treatment: Gait belt Activity Tolerance: Patient tolerated treatment well Patient left: in bed;with call bell/phone within reach;with bed alarm set Nurse Communication: Mobility status PT Visit Diagnosis: Unsteadiness on feet (R26.81);Muscle weakness (generalized) (M62.81);Difficulty in walking, not elsewhere classified (R26.2);Pain Pain - Right/Left: Right Pain - part of body: Leg     Time: [id number] PT Time Calculation (min) (ACUTE ONLY): 29 min  Charges:  $Gait Training: 8-22 mins $Therapeutic Activity: 8-22 mins        Ramond Dial 12/10/2022, 3:58 PM  Mee Hives, PT PhD Acute Rehab Dept. Number: [place] and [place]

## 2022-12-10 NOTE — Progress Notes (Addendum)
Rounding Note    Patient Name: Javier Berry Date of Encounter: 12/10/2022  [place] Cardiologist: Kirk Ruths, MD   Subjective   Patient reports that breathing continues to improve from admission. He denies palpitations this morning despite recurrence of RVR with permanent afib. His only new symptom this morning was waking up in a cold sweat. Denies chest pain as well.  Inpatient Medications    Scheduled Meds:  apixaban  5 mg Oral BID   digoxin  0.125 mg Oral Daily   influenza vac split quadrivalent PF  0.5 mL Intramuscular Tomorrow-1000   metoprolol succinate  100 mg Oral BID   spironolactone  25 mg Oral Daily   Continuous Infusions:  sodium chloride Stopped (12/08/22 1825)   furosemide 120 mg (12/10/22 0802)   magnesium sulfate bolus IVPB     PRN Meds: sodium chloride, acetaminophen **OR** acetaminophen, fluticasone furoate-vilanterol, ipratropium   Vital Signs    Vitals:   12/09/22 1926 12/09/22 2352 12/10/22 0352 12/10/22 0749  BP: 115/71 98/64 (!) 126/91 112/78  Pulse: 72 84 85 65  Resp: '18 18 18 18  '$ Temp: 98.2 F (36.8 C) 98.2 F (36.8 C) 97.9 F (36.6 C) 97.9 F (36.6 C)  TempSrc: Oral Oral Oral Oral  SpO2: 97% 99% 96% 100%  Weight:   96.8 kg   Height:        Intake/Output Summary (Last 24 hours) at 12/10/2022 0948 Last data filed at 12/10/2022 0747 Gross per 24 hour  Intake 598 ml  Output 4800 ml  Net -4202 ml      12/10/2022    3:52 AM 12/09/2022    3:05 AM 12/08/2022    4:05 AM  Last 3 Weights  Weight (lbs) 213 lb 6.5 oz 211 lb 3.2 oz 222 lb 10.6 oz  Weight (kg) 96.8 kg 95.8 kg 101 kg      Telemetry    Afib with RVR this AM. Rates 100s-150s - Personally Reviewed  ECG    No new tracing. Repeat ordered - Personally Reviewed  Physical Exam   GEN: No acute distress.   Neck: JVP elevated ~2-3cm above clavicle Cardiac: RRR, no murmurs, rubs, or gallops.  Respiratory: Clear to auscultation bilaterally. GI: Soft,  nontender, non-distended  MS: 2-3+ pitting edema lower extremities, R>L Neuro:  Nonfocal  Psych: Normal affect   Labs    High Sensitivity Troponin:   Recent Labs  Lab 12/07/22 1630 12/07/22 1830  TROPONINIHS 15 18*     Chemistry Recent Labs  Lab 12/08/22 0701 12/09/22 0051 12/10/22 0031  NA 138 140 136  K 3.5 3.8 3.5  CL 103 100 100  CO2 26 32 28  GLUCOSE 93 119* 124*  BUN '12 12 13  '$ CREATININE 1.34* 1.54* 1.47*  CALCIUM 8.3* 8.4* 8.3*  MG 1.9 1.8 1.9  GFRNONAA 60* 50* 53*  ANIONGAP '9 8 8    '$ Lipids No results for input(s): "CHOL", "TRIG", "HDL", "LABVLDL", "LDLCALC", "CHOLHDL" in the last 168 hours.  Hematology Recent Labs  Lab 12/07/22 1534 12/08/22 0701 12/09/22 0051  WBC 10.7* 8.8 9.6  RBC 4.12* 3.94* 4.40  HGB 13.3 12.6* 14.1  HCT 39.5 37.8* 41.8  MCV 95.9 95.9 95.0  MCH 32.3 32.0 32.0  MCHC 33.7 33.3 33.7  RDW 15.3 15.3 15.1  PLT 143* 150 170   Thyroid No results for input(s): "TSH", "FREET4" in the last 168 hours.  BNP Recent Labs  Lab 12/07/22 1534  BNP 231.0*    DDimer  No results for input(s): "DDIMER" in the last 168 hours.   Radiology    VAS [name] LOWER EXTREMITY VENOUS (DVT)  Result Date: 12/08/2022  Lower Venous DVT Study Patient Name:  Javier Berry  Date of Exam:   12/08/2022 Medical Rec #: [id number]       Accession #:    [id number] Date of Birth: 1961-04-01       Patient Gender: M Patient Age:   84 years Exam Location:  [facility] Procedure:      VAS [name] LOWER EXTREMITY VENOUS (DVT) Referring Phys: TODD MCDIARMID --------------------------------------------------------------------------------  Indications: Edema, right greater than left.  Risk Factors: Acute on chronic CHF. Anticoagulation: Eliquis. Comparison Study: No prior studies. Performing Technologist: Darlin Coco RDMS, RVT  Examination Guidelines: A complete evaluation includes B-mode imaging, spectral Doppler, color Doppler, and power Doppler as needed of all accessible  portions of each vessel. Bilateral testing is considered an integral part of a complete examination. Limited examinations for reoccurring indications may be performed as noted. The reflux portion of the exam is performed with the patient in reverse Trendelenburg.  +---------+---------------+---------+-----------+----------+--------------+ RIGHT    CompressibilityPhasicitySpontaneityPropertiesThrombus Aging +---------+---------------+---------+-----------+----------+--------------+ CFV      Full           Yes      Yes                                 +---------+---------------+---------+-----------+----------+--------------+ SFJ      Full                                                        +---------+---------------+---------+-----------+----------+--------------+ FV Prox  Full                                                        +---------+---------------+---------+-----------+----------+--------------+ FV Mid   Full                                                        +---------+---------------+---------+-----------+----------+--------------+ FV DistalFull                                                        +---------+---------------+---------+-----------+----------+--------------+ PFV      Full                                                        +---------+---------------+---------+-----------+----------+--------------+ POP      Full           Yes      Yes                                 +---------+---------------+---------+-----------+----------+--------------+  PTV      Full                                                        +---------+---------------+---------+-----------+----------+--------------+ PERO     Full                                                        +---------+---------------+---------+-----------+----------+--------------+   +----+---------------+---------+-----------+----------+--------------+  LEFTCompressibilityPhasicitySpontaneityPropertiesThrombus Aging +----+---------------+---------+-----------+----------+--------------+ CFV Full           Yes      Yes                                 +----+---------------+---------+-----------+----------+--------------+     Summary: RIGHT: - There is no evidence of deep vein thrombosis in the lower extremity.  - No cystic structure found in the popliteal fossa.  LEFT: - No evidence of common femoral vein obstruction.  *See table(s) above for measurements and observations. Electronically signed by Orlie Pollen on 12/08/2022 at 6:03:16 PM.    Final    ECHOCARDIOGRAM COMPLETE  Result Date: 12/08/2022    ECHOCARDIOGRAM REPORT   Patient Name:   Javier Berry Date of Exam: 12/08/2022 Medical Rec #:  [id number]      Height:       72.0 in Accession #:    [id number]     Weight:       222.7 lb Date of Birth:  September 14, 1961      BSA:          2.230 m Patient Age:    24 years       BP:           123/73 mmHg Patient Gender: M              HR:           113 bpm. Exam Location:  Inpatient Procedure: 2D Echo, 3D Echo, Strain Analysis, Cardiac Doppler and Color Doppler Indications:    CHF-Acute Systolic [id number]  History:        Patient has prior history of Echocardiogram examinations, most                 recent 04/03/2022. CHF, Arrythmias:Atrial Fibrillation,                 Signs/Symptoms:Shortness of Breath and Edema; Risk                 Factors:Hypertension.  Sonographer:    Wilkie Aye RVT RCS Referring Phys: [id number] [place]  1. There is severe global hypokinesis of the LV with relative sparing of the inferolateral wall. Left ventricular ejection fraction, by estimation, is 25 to 30%. The left ventricle has severely decreased function. The left ventricle demonstrates global hypokinesis. There is mild concentric left ventricular hypertrophy. Left ventricular diastolic function could not be evaluated.  2. Right ventricular systolic function is  hyperdynamic. The right ventricular size is normal. There is mildly elevated pulmonary artery systolic pressure.  3. Left atrial size was moderately dilated.  4. Right atrial size was severely dilated.  5. The mitral  valve is normal in structure. No evidence of mitral valve regurgitation. No evidence of mitral stenosis.  6. Tricuspid valve regurgitation is moderate.  7. The aortic valve is normal in structure. Aortic valve regurgitation is mild. No aortic stenosis is present.  8. The inferior vena cava is dilated in size with <50% respiratory variability, suggesting right atrial pressure of 15 mmHg. FINDINGS  Left Ventricle: There is severe global hypokinesis of the LV with relative sparing of the inferolateral wall. Left ventricular ejection fraction, by estimation, is 25 to 30%. The left ventricle has severely decreased function. The left ventricle demonstrates global hypokinesis. The left ventricular internal cavity size was normal in size. There is mild concentric left ventricular hypertrophy. Left ventricular diastolic function could not be evaluated due to atrial fibrillation. Left ventricular diastolic function could not be evaluated. Right Ventricle: The right ventricular size is normal. No increase in right ventricular wall thickness. Right ventricular systolic function is hyperdynamic. There is mildly elevated pulmonary artery systolic pressure. The tricuspid regurgitant velocity is 2.26 m/s, and with an assumed right atrial pressure of 15 mmHg, the estimated right ventricular systolic pressure is [id number] mmHg. Left Atrium: Left atrial size was moderately dilated. Right Atrium: Right atrial size was severely dilated. Pericardium: Trivial pericardial effusion is present. The pericardial effusion is localized near the right atrium. Mitral Valve: The mitral valve is normal in structure. No evidence of mitral valve regurgitation. No evidence of mitral valve stenosis. Tricuspid Valve: The tricuspid valve is normal  in structure. Tricuspid valve regurgitation is moderate . No evidence of tricuspid stenosis. Aortic Valve: The aortic valve is normal in structure. Aortic valve regurgitation is mild. No aortic stenosis is present. Aortic valve mean gradient measures 3.3 mmHg. Aortic valve peak gradient measures 6.3 mmHg. Aortic valve area, by VTI measures 3.72 cm. Pulmonic Valve: The pulmonic valve was normal in structure. Pulmonic valve regurgitation is not visualized. No evidence of pulmonic stenosis. Aorta: The aortic root is normal in size and structure. Venous: The inferior vena cava is dilated in size with less than 50% respiratory variability, suggesting right atrial pressure of 15 mmHg. IAS/Shunts: No atrial level shunt detected by color flow Doppler.  LEFT VENTRICLE PLAX 2D LVIDd:         5.60 cm LVIDs:         4.50 cm LV PW:         1.40 cm LV IVS:        0.90 cm LVOT diam:     2.10 cm   3D Volume EF: LV SV:         66        3D EF:        46 % LV SV Index:   30        LV EDV:       177 ml LVOT Area:     3.46 cm  LV ESV:       96 ml                          LV SV:        81 ml RIGHT VENTRICLE          IVC RV Basal diam:  4.50 cm  IVC diam: 2.00 cm RV Mid diam:    4.00 cm LEFT ATRIUM              Index        RIGHT ATRIUM  Index LA diam:        4.90 cm  2.20 cm/m   RA Area:     38.50 cm LA Vol (A2C):   119.0 ml 53.36 ml/m  RA Volume:   169.00 ml 75.78 ml/m LA Vol (A4C):   98.2 ml  44.04 ml/m LA Biplane Vol: 116.0 ml 52.02 ml/m  AORTIC VALVE                    PULMONIC VALVE AV Area (Vmax):    3.78 cm     PV Vmax:       1.05 m/s AV Area (Vmean):   3.69 cm     PV Peak grad:  4.4 mmHg AV Area (VTI):     3.72 cm AV Vmax:           125.67 cm/s AV Vmean:          83.200 cm/s AV VTI:            0.177 m AV Peak Grad:      6.3 mmHg AV Mean Grad:      3.3 mmHg LVOT Vmax:         137.00 cm/s LVOT Vmean:        88.567 cm/s LVOT VTI:          0.190 m LVOT/AV VTI ratio: 1.08  AORTA Ao Root diam: 2.70 cm Ao Asc diam:   3.30 cm MITRAL VALVE               TRICUSPID VALVE MV Area (PHT): 3.46 cm    TR Peak grad:   20.4 mmHg MV Decel Time: 220 msec    TR Vmax:        226.00 cm/s MV E velocity: 68.80 cm/s                            SHUNTS                            Systemic VTI:  0.19 m                            Systemic Diam: 2.10 cm Glori Bickers MD Electronically signed by Glori Bickers MD Signature Date/Time: 12/08/2022/1:08:08 PM    Final     Cardiac Studies   12/08/22 TTE  IMPRESSIONS     1. There is severe global hypokinesis of the LV with relative sparing of  the inferolateral wall. Left ventricular ejection fraction, by estimation,  is 25 to 30%. The left ventricle has severely decreased function. The left  ventricle demonstrates global  hypokinesis. There is mild concentric left ventricular hypertrophy. Left  ventricular diastolic function could not be evaluated.   2. Right ventricular systolic function is hyperdynamic. The right  ventricular size is normal. There is mildly elevated pulmonary artery  systolic pressure.   3. Left atrial size was moderately dilated.   4. Right atrial size was severely dilated.   5. The mitral valve is normal in structure. No evidence of mitral valve  regurgitation. No evidence of mitral stenosis.   6. Tricuspid valve regurgitation is moderate.   7. The aortic valve is normal in structure. Aortic valve regurgitation is  mild. No aortic stenosis is present.   8. The inferior vena cava is dilated in size with <50% respiratory  variability, suggesting right  atrial pressure of 15 mmHg.   Patient Profile     Javier Berry is a 62 y.o. male with a hx of chronic systolic heart failure, permanent atrial fibrillation, hypertension, and CKD stage III who is being seen in consult for the evaluation of afib with RVR at the request of Dr. McDiarmid.   Assessment & Plan    Permanent atrial fibrillation with RVR  Prior to admission, patient on home regimen of  Metoprolol, Digoxin, Eliquis, though has not been taking Digoxin after running out. Rate control initially improved this admission following re-load of digoxin. This morning, rates are back up from 120s-150s. Fortunately, patient is without symptoms though given HFrEF, will need to focus on additional rate reduction. Patient is already on max dose metoprolol and digoxin level was appropriate on 3/12.  Could consider using Amiodarone rather than digoxin for rate control. ECG ordered to assess QT. Will discuss further with Dr. Harrington Challenger.  Continue Metoprolol Succinate '100mg'$  BID.  Continue Eliquis  Acute on chronic systolic heart failure  Patient with hx fluctuant LVEF. EF down to 35-40% in 2016, recovered to normal by 2020 with medical therapy though was then decreased again 30-35% in July 2023 in the setting of afib with RVR. TTE this admission with LVEF 25-30%, global hypokinesis.   BNP 231.0 this admission. Patient now net negative 13L this admission.  JVP still elevated, 3cm above clavicle on physical exam with lower extremity edema R>L.  Continue IV diuresis today with lasix '120mg'$  BID. Continue Spironolactone '25mg'$   Consider adding Farxiga at discharge, $0 per month with co-pay card   Will consider adding ARB/ARNI if BP improves.   Hypertension  BP low normal today.  CKD stage III  Creatinine near baseline at 1.47 today. Continue to follow closely with diuresis.       For questions or updates, please contact [place] Please consult [email or web address] for contact info under        Signed, Lily Kocher, PA-C  12/10/2022, 9:48 AM    Patient seen and examined   I agree with findings as noted by E Williams above  Pt comfortable laying in bed   NO SOB ON exam JVP is normal  Lungs are CTA Cardiac    Irreg irreg   No S3    Ext are without edema  Afib Rates were higher earlier today   Now around 90s to 100s    Keep on current regimen     Ambulate Low threshold to start  amiodarone to help rate control      WIll continue to follow   Dorris Carnes MD

## 2022-12-10 NOTE — Progress Notes (Signed)
Daily Progress Note Intern Pager: [phone]  Patient name: Javier Berry Medical record number: [id number] Date of birth: Apr 21, 1961 Age: 62 y.o. Gender: male  Primary Care Provider: Pcp, No Consultants: Cardiology Code Status: Full code  Pt Overview and Major Events to Date:  3/10: Admitted  Assessment and Plan:  Javier Berry is a 62 y.o. male presenting with lower extremity edema and exertional dyspnea. Pertinent PMH/PSH includes HFrEF, A fib, HTN. CKD3, Gout, NICM, PUD.    Continues to diurese well.  Peripheral edema significantly improving.  However patient reports having renewed palpitations earlier this morning.  While in the room patient in A-fib with rates from 110-120 at bedside.  Received metoprolol and digoxin early this morning at 0630 in an attempt to help with rate control, unfortunately unsuccessful.   * Acute on chronic systolic CHF (congestive heart failure) (HCC) Weight 101 kg > 96.8 kg. UOP - 4.2 L 3/12. Echo showing worsening LVEF 25-30% w/ severe global hypokinesis.  U/S neg for DVT.  - daily weight - strict I&Os  HF Medications:   - Metoprolol 100 mg BID starting 3/11  - Lasix 120 mg IV BID (3/11)  - Aldactone 25 mg (3/11)  - consider adding Entresto   - consider adding SGLT2i  - Flexeril 5 mg TID PRN for leg cramping and pain, optimizing electrolytes   Atrial fibrillation with RVR (HCC) Tachycardic this morning 110-120 - cardiology consulted, appreciate recommendations.  - Toprol 100 mg BID - Digoxin 0.125 mg daily   Hypokalemia K 3.5 - recheck BMP daily  - Goal K >4, Mag > 2 - ordered 40 mEq K-lor tablet  - ordered 1 g Mag replacement   SOB (shortness of breath) on exertion - Atrovent nebs - cont Breo  - PFTs outpatient   CKD (chronic kidney disease) stage 3, GFR 30-59 ml/min (HCC) Cr 1.54>1.47; baseline appears to be ~1.5-1.7 - daily BMP    FEN/GI: heart healthy, fluid restriction 1800 mL  PPx: Lovenox  Dispo:Home pending  clinical improvement . Barriers include ongoing medical management.   Subjective:  NAEO, resting comfortably, reports improvement in leg pain.   Objective: Temp:  [97 F (36.1 C)-98.6 F (37 C)] 97.9 F (36.6 C) (03/13 0749) Pulse Rate:  [60-85] 65 (03/13 0749) Resp:  [18] 18 (03/13 0749) BP: (98-126)/(64-94) 112/78 (03/13 0749) SpO2:  [96 %-100 %] 100 % (03/13 0749) Weight:  [96.8 kg] 96.8 kg (03/13 0352) Physical Exam: Well-appearing, no acute distress Cardio: tachycardic, irregularly regular rhythm, no murmurs on exam. Pulm: Clear, no wheezing, no crackles. No increased work of breathing Abdominal: bowel sounds present, soft, non-tender, non-distended Extremities: trace peripheral edema   Laboratory: Most recent CBC Lab Results  Component Value Date   WBC 9.6 12/09/2022   HGB 14.1 12/09/2022   HCT 41.8 12/09/2022   MCV 95.0 12/09/2022   PLT 170 12/09/2022   Most recent BMP    Latest Ref Rng & Units 12/10/2022   12:31 AM  BMP  Glucose 70 - 99 mg/dL 124   BUN 8 - 23 mg/dL 13   Creatinine 0.61 - 1.24 mg/dL 1.47   Sodium 135 - 145 mmol/L 136   Potassium 3.5 - 5.1 mmol/L 3.5   Chloride 98 - 111 mmol/L 100   CO2 22 - 32 mmol/L 28   Calcium 8.9 - 10.3 mg/dL 8.3    Darci Current, DO 12/10/2022, 8:20 AM  PGY-1, [place] Intern pager: [phone], text pages welcome  Secure chat group [facility] Teaching Service

## 2022-12-10 NOTE — Progress Notes (Signed)
   Heart Failure Stewardship Pharmacist Progress Note   PCP: Pcp, No PCP-Cardiologist: Kirk Ruths, MD    HPI:  62 yo M with PMH of HTN, CHF, CKD, and afib.   Presented to the ED on 3/10 with LE edema, shortness of breath, weight gain, with cough and congestion. Reports he has been off digoxin for several months but was still taking metoprolol and Elliquis. In afib RVR on arrival. CXR with enlargement of the cardiopericardial silhouette without acute cardiopulmonary findings. EF was 35% in 2016 and 2018, improved to 55-60% in 2020, back down to 30-35% in 2023 after being lost to follow up with cardiology and was no longer taking meds. ECHO this admission showed LVEF 25-30%, global hypokinesis, mild concentric LVH, RV hyperdynamic, and mildly elevated PA pressures.  Current HF Medications: Diuretic: furosemide 120 mg IV BID Beta Blocker: metoprolol XL 100 mg BID MRA: spironolactone 25 mg daily Other: digoxin 0.125 mg daily  Prior to admission HF Medications: Diuretic: furosemide 40 mg daily Beta blocker: metoprolol XL 25 mg daily  Pertinent Lab Values: Serum creatinine 1.47, BUN 13, Potassium 3.5, Sodium 136, BNP 231, Magnesium 1.9, Digoxin level 0.7  Vital Signs: Weight: 213 lbs (admission weight: 222 lbs) Blood pressure: 120/70s  Heart rate: 110-120s - afib I/O: -4.7L yesterday; net -11.4L  Medication Assistance / Insurance Benefits Check: Does the patient have prescription insurance?  Yes Type of insurance plan: [place] [employer] health plan  Outpatient Pharmacy:  Prior to admission outpatient pharmacy: [facility] Is the patient willing to use [place] pharmacy at discharge? Yes Is the patient willing to transition their outpatient pharmacy to utilize a [facility] outpatient pharmacy?   Pending    Assessment: 1. Acute on chronic systolic CHF (LVEF 01-31%), due to NICM. NYHA class III symptoms. - Continue furosemide 120 mg IV BID. Strict I/Os and daily weights. Keep K>4 and  Mg>2. KCl 40 mEq and Mg 1 g IV x 1 given for replacement. - Continue metoprolol XL 100 mg BID - Continue spironolactone 25 mg daily - Received digoxin 0.25 mg IV on 3/10 and 3/11. Digoxin 0.125 mg daily ordered to start 3/11. Level on 3/12 was ok 0.7. Continue digoxin 0.125 mg daily. Recommend checking digoxin level on Friday.   Plan: 1) Medication changes recommended at this time: - Continue current regimen - Check digoxin level on Friday  2) Patient assistance: - Farxiga copay $47 - copay card lowers to $0 per month  3)  Education  - To be completed prior to discharge  Kerby Nora, PharmD, BCPS Heart Failure Stewardship Pharmacist Phone [phone]

## 2022-12-10 NOTE — Progress Notes (Signed)
OT Cancellation Note  Patient Details Name: Javier Berry MRN: [id number] DOB: 31-Mar-1961   Cancelled Treatment:    Reason Eval/Treat Not Completed: Medical issues which prohibited therapy;Other (comment) (Orders received and chart reviewed. Pt has been in a-fib. Spoke with RN whom asked that therapy hold until later. Will check back as time and schedule allows for OT assessment)  Almyra Deforest, OTR/L 12/10/2022, 9:21 AM

## 2022-12-10 NOTE — Hospital Course (Addendum)
Javier Berry is a 62 y.o. male presenting with lower extremity edema and exertional dyspnea. Pertinent PMH/PSH includes HFrEF, A fib, HTN. CKD3, Gout, NICM, PUD.  His hospital course is outlined below:  Acute on chronic systolic congestive heart failure: Patient presented to the ED with several weeks of worsening shortness of breath, peripheral edema.  Historical echo showing LVEF 30-35%.  He was given 40 mg IV Lasix with minimal urine output cardiology was consulted and started him on 120 mg IV Lasix BID.  He had good urine output with peripheral edema resolving.  He diuresed total of 18.5 liters.  At discharge his weight was 88.2 kg.  He was discharged home with the following medications: Farxiga 10 mg daily. No indication to continue on Lasix outpatient at this time, will maintain with daily farxiga.  A-fib with RVR: At presentation patient was noted to be in A-fib with RVR with rates in the 120s.  Attempted digoxin load overnight as patient has been out of digoxin for over a month prior to admission.  Unfortunately rates were not controlled and cardiology was consulted.  They restarted his home metoprolol 100 mg twice daily and digoxin 0.125 mg daily.  He remained rate controlled for a few days but went back into A-fib with RVR on 3/13. He was loaded with amiodarone on 3/14 with good rate control. He was transitioned to PO on 3/15. At discharge he was  to  metoprolol reduced to 25mg  BID due to intermittent brady.   Shortness of breath: On initial presentation patient was short of breath with dry cough.  Home medications of Breo inhaler and Atrovent were restarted with improvement.  Patient's respiratory status also improved with diuresis.  At discharge she was continued on his home inhalers.  PCP follow-up: PFTs outpatient to confirm diagnosis of presumed COPD. Discontinued Digioxin and started on Amiodarone 200mg  daily  Follow-up BMP outpatient to recheck potassium. Monitor BP Consider  starting Entresto if appropriate with BP. Left knee pain, consider imaging.

## 2022-12-11 DIAGNOSIS — R509 Fever, unspecified: Secondary | ICD-10-CM | POA: Insufficient documentation

## 2022-12-11 DIAGNOSIS — K30 Functional dyspepsia: Secondary | ICD-10-CM | POA: Insufficient documentation

## 2022-12-11 DIAGNOSIS — I5023 Acute on chronic systolic (congestive) heart failure: Secondary | ICD-10-CM | POA: Diagnosis not present

## 2022-12-11 LAB — BASIC METABOLIC PANEL
Anion gap: 14 (ref 5–15)
BUN: 17 mg/dL (ref 8–23)
CO2: 24 mmol/L (ref 22–32)
Calcium: 8.2 mg/dL — ABNORMAL LOW (ref 8.9–10.3)
Chloride: 96 mmol/L — ABNORMAL LOW (ref 98–111)
Creatinine, Ser: 1.5 mg/dL — ABNORMAL HIGH (ref 0.61–1.24)
GFR, Estimated: 52 mL/min — ABNORMAL LOW (ref >60)
Glucose, Bld: 197 mg/dL — ABNORMAL HIGH (ref 70–99)
Potassium: 4.1 mmol/L (ref 3.5–5.1)
Sodium: 134 mmol/L — ABNORMAL LOW (ref 135–145)

## 2022-12-11 LAB — URINALYSIS, ROUTINE W REFLEX MICROSCOPIC
Bacteria, UA: NONE SEEN
Bilirubin Urine: NEGATIVE
Glucose, UA: NEGATIVE mg/dL
Ketones, ur: NEGATIVE mg/dL
Leukocytes,Ua: NEGATIVE
Nitrite: NEGATIVE
Protein, ur: NEGATIVE mg/dL
Specific Gravity, Urine: 1.006 (ref 1.005–1.030)
pH: 7 (ref 5.0–8.0)

## 2022-12-11 LAB — MAGNESIUM: Magnesium: 2 mg/dL (ref 1.7–2.4)

## 2022-12-11 LAB — CBC
HCT: 42.4 % (ref 39.0–52.0)
Hemoglobin: 14.9 g/dL (ref 13.0–17.0)
MCH: 32.6 pg (ref 26.0–34.0)
MCHC: 35.1 g/dL (ref 30.0–36.0)
MCV: 92.8 fL (ref 80.0–100.0)
Platelets: 200 10*3/uL (ref 150–400)
RBC: 4.57 MIL/uL (ref 4.22–5.81)
RDW: 14.7 % (ref 11.5–15.5)
WBC: 10 10*3/uL (ref 4.0–10.5)
nRBC: 0 % (ref 0.0–0.2)

## 2022-12-11 MED ORDER — LEVALBUTEROL HCL 0.63 MG/3ML IN NEBU
0.6300 mg | INHALATION_SOLUTION | Freq: Four times a day (QID) | RESPIRATORY_TRACT | Status: DC | PRN
  Administered 2022-12-11: 0.63 mg via RESPIRATORY_TRACT
  Filled 2022-12-11: qty 3

## 2022-12-11 MED ORDER — AMIODARONE LOAD VIA INFUSION
150.0000 mg | Freq: Once | INTRAVENOUS | Status: AC
  Administered 2022-12-11: 150 mg via INTRAVENOUS
  Filled 2022-12-11: qty 83.34

## 2022-12-11 MED ORDER — CALCIUM CARBONATE ANTACID 500 MG PO CHEW
1.0000 | CHEWABLE_TABLET | Freq: Two times a day (BID) | ORAL | Status: DC | PRN
  Administered 2022-12-11 (×2): 200 mg via ORAL
  Filled 2022-12-11 (×2): qty 1

## 2022-12-11 MED ORDER — AMIODARONE HCL IN DEXTROSE 360-4.14 MG/200ML-% IV SOLN
30.0000 mg/h | INTRAVENOUS | Status: DC
  Administered 2022-12-11 – 2022-12-12 (×3): 30 mg/h via INTRAVENOUS
  Filled 2022-12-11 (×2): qty 200

## 2022-12-11 MED ORDER — AMIODARONE HCL IN DEXTROSE 360-4.14 MG/200ML-% IV SOLN
60.0000 mg/h | INTRAVENOUS | Status: DC
  Administered 2022-12-11 (×2): 60 mg/h via INTRAVENOUS
  Filled 2022-12-11 (×2): qty 200

## 2022-12-11 NOTE — Assessment & Plan Note (Deleted)
Afebrile overnight Bcx negative <12 hrs - trend fever curve  - f/u blood cultures  - viral testing if re-fevers

## 2022-12-11 NOTE — Progress Notes (Signed)
   Heart Failure Stewardship Pharmacist Progress Note   PCP: Pcp, No PCP-Cardiologist: Kirk Ruths, MD    HPI:  62 yo M with PMH of HTN, CHF, CKD, and afib.   Presented to the ED on 3/10 with LE edema, shortness of breath, weight gain, with cough and congestion. Reports he has been off digoxin for several months but was still taking metoprolol and Elliquis. In afib RVR on arrival. CXR with enlargement of the cardiopericardial silhouette without acute cardiopulmonary findings. EF was 35% in 2016 and 2018, improved to 55-60% in 2020, back down to 30-35% in 2023 after being lost to follow up with cardiology and was no longer taking meds. ECHO this admission showed LVEF 25-30%, global hypokinesis, mild concentric LVH, RV hyperdynamic, and mildly elevated PA pressures. Started on IV amiodarone and discontinued digoxin for afib RVR.   Current HF Medications: Diuretic: furosemide 120 mg IV BID Beta Blocker: metoprolol XL 100 mg BID MRA: spironolactone 25 mg daily  Prior to admission HF Medications: Diuretic: furosemide 40 mg daily Beta blocker: metoprolol XL 25 mg daily  Pertinent Lab Values: Serum creatinine 1.50, BUN 17, Potassium 4.1, Sodium 134, BNP 231, Magnesium 2.0, Digoxin level 0.7  Vital Signs: Weight: 203 lbs (admission weight: 222 lbs) Blood pressure: 100-110/70s  Heart rate: 90s - afib I/O: -4.3L yesterday; net -17L  Medication Assistance / Insurance Benefits Check: Does the patient have prescription insurance?  Yes Type of insurance plan: [place] [employer] health plan  Outpatient Pharmacy:  Prior to admission outpatient pharmacy: [facility] Is the patient willing to use [place] pharmacy at discharge? Yes Is the patient willing to transition their outpatient pharmacy to utilize a [facility] outpatient pharmacy?   Pending    Assessment: 1. Acute on chronic systolic CHF (LVEF 82-99%), due to NICM. NYHA class III symptoms. - Continue furosemide 120 mg IV BID. Strict I/Os  and daily weights. Keep K>4 and Mg>2.  - Continue metoprolol XL 100 mg BID - Continue spironolactone 25 mg daily - Received digoxin 0.25 mg IV on 3/10 and 3/11. Digoxin 0.125 mg daily started on 3/11. Level on 3/12 was ok 0.7. Now off digoxin and started on IV amiodarone for afib RVR.   Plan: 1) Medication changes recommended at this time: - Continue current regimen  2) Patient assistance: - Farxiga copay $47 - copay card lowers to $0 per month  3)  Education  - Patient has been educated on current HF medications and potential additions to HF medication regimen - Patient verbalizes understanding that over the next few months, these medication doses may change and more medications may be added to optimize HF regimen - Patient has been educated on basic disease state pathophysiology and goals of therapy   Kerby Nora, PharmD, BCPS Heart Failure Stewardship Pharmacist Phone [phone]

## 2022-12-11 NOTE — Evaluation (Signed)
Occupational Therapy Evaluation Patient Details Name: Javier Berry MRN: [id number] DOB: 14-Oct-1960 Today's Date: 12/11/2022   History of Present Illness Pt is 62 yo male who presents on 12/07/22 with  cough and congestion x 1 month, increase in leg swelling x 3 weeks. Reported to taking lasix as prescribed, however, stopped taking his digoxin a few months ago. Pt in Afib with RVR. PMH: CHF, CKD3, HTN, gout, NICM, Afib, PUD   Clinical Impression   Patient admitted for the diagnosis above.  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  Patient did not need any assist with ADL completion at baseline.  Limited assessment this date due to patient in a-fib with RVR, HR ranging 121-168.  Able to sit edge of bed and complete light grooming task with setup.  Patient needing Min to Mod A for bed mobility due to chronic bilateral knee pain.  OT is indicated in the acute setting to advance the patient out of bed, and address deficits listed.  Post acute OT will depend on progress, but that could take place at home if needed.           Recommendations for follow up therapy are one component of a multi-disciplinary discharge planning process, led by the attending physician.  Recommendations may be updated based on patient status, additional functional criteria and insurance authorization.   Follow Up Recommendations  Home health OT     Assistance Recommended at Discharge Set up Supervision/Assistance  Patient can return home with the following Assist for transportation;Assistance with cooking/housework    Functional Status Assessment  Patient has had a recent decline in their functional status and demonstrates the ability to make significant improvements in function in a reasonable and predictable amount of time.  Equipment Recommendations  None recommended by OT    Recommendations for Other Services       Precautions / Restrictions  Precautions Precautions: Fall;Other (comment) Precaution Comments: Watch HR Restrictions Weight Bearing Restrictions: No      Mobility Bed Mobility Overal bed mobility: Needs Assistance Bed Mobility: Supine to Sit, Sit to Supine     Supine to sit: Min assist, Mod assist Sit to supine: Mod assist        Transfers                   General transfer comment: held due to HR, but patient able to lateral scoot to [facility].      Balance Overall balance assessment: Needs assistance Sitting-balance support: Feet supported Sitting balance-Leahy Scale: Good                                     ADL either performed or assessed with clinical judgement   ADL                                         General ADL Comments: To be assessed.  Limited eval due to A-fib     Vision Patient Visual Report: No change from baseline       Perception     Praxis      Pertinent Vitals/Pain Pain Assessment Pain Assessment: No/denies pain     Hand Dominance Right   Extremity/Trunk Assessment Upper Extremity Assessment Upper Extremity Assessment: Overall [facility]  for tasks assessed   Lower Extremity Assessment Lower Extremity Assessment: Defer to PT evaluation   Cervical / Trunk Assessment Cervical / Trunk Assessment: Normal   Communication Communication Communication: No difficulties   Cognition Arousal/Alertness: Awake/alert Behavior During Therapy: WFL for tasks assessed/performed Overall Cognitive Status: Within Functional Limits for tasks assessed                                       General Comments   VSS other than HR    Exercises     Shoulder Instructions      Home Living Family/patient expects to be discharged to:: Private residence Living Arrangements: Spouse/significant other;Children Available Help at Discharge: Family;Available 24 hours/day Type of Home: House Home Access: Stairs to enter [employer] of Steps: 4 Entrance Stairs-Rails: Right;Left Home Layout: Two level;1/2 bath on main level;Able to live on main level with bedroom/bathroom Alternate Level Stairs-Number of Steps: flight, 1/2 bath main floor.  Stays on the main level due to knee pain.   Bathroom Shower/Tub: Tub/shower unit;Walk-in shower;Sponge bathes at baseline   Bathroom Toilet: Standard Bathroom Accessibility: Yes How Accessible: Accessible via walker Home Equipment: None   Additional Comments: Had a sink placed in the room he stays down stairs, sink baths      Prior Functioning/Environment Prior Level of Function : Independent/Modified Independent;Working/employed;Driving             Mobility Comments: drives a school bus 20 hrs/wk ADLs Comments: independent        OT Problem List: Decreased strength;Decreased range of motion;Decreased activity tolerance;Impaired balance (sitting and/or standing);Pain      OT Treatment/Interventions: Self-care/ADL training;Therapeutic activities;Patient/family education;Balance training;DME and/or AE instruction;Energy conservation    OT Goals(Current goals can be found in the care plan section) Acute Rehab OT Goals Patient Stated Goal: Return home OT Goal Formulation: With patient Time For Goal Achievement: 12/25/22 Potential to Achieve Goals: Good ADL Goals Pt Will Perform Grooming: with modified independence;standing Pt Will Perform Lower Body Dressing: with modified independence;sit to/from stand Pt Will Transfer to Toilet: with modified independence;ambulating;regular height toilet  OT Frequency: Min 2X/week    Co-evaluation              AM-PAC OT "6 Clicks" Daily Activity     Outcome Measure Help from another person eating meals?: None Help from another person taking care of personal grooming?: A Little Help from another person toileting, which includes using toliet, bedpan, or urinal?: A Little Help from another person bathing  (including washing, rinsing, drying)?: A Lot Help from another person to put on and taking off regular upper body clothing?: None Help from another person to put on and taking off regular lower body clothing?: A Lot 6 Click Score: 18   End of Session Nurse Communication: Mobility status  Activity Tolerance: Patient tolerated treatment well Patient left: in bed;with call bell/phone within reach  OT Visit Diagnosis: Unsteadiness on feet (R26.81);Muscle weakness (generalized) (M62.81);Pain Pain - Right/Left: Right Pain - part of body: Knee                Time: 0910-0925 OT Time Calculation (min): 15 min Charges:  OT General Charges $OT Visit: 1 Visit OT Evaluation $OT Eval Moderate Complexity: 1 Mod  12/11/2022  RP, OTR/L  Acute Rehabilitation Services  Office:  [phone]   Metta Clines 12/11/2022, 9:32 AM

## 2022-12-11 NOTE — Progress Notes (Addendum)
Rounding Note    Patient Name: Javier Berry Date of Encounter: 12/11/2022  [place] Cardiologist: Kirk Ruths, MD   Subjective   Patient appears comfortable in bed this morning. He denies chest pain, palpitations, dyspnea. His primary source of discomfort continues to be his knees.   Inpatient Medications    Scheduled Meds:  apixaban  5 mg Oral BID   digoxin  0.125 mg Oral Daily   influenza vac split quadrivalent PF  0.5 mL Intramuscular Tomorrow-1000   metoprolol succinate  100 mg Oral BID   spironolactone  25 mg Oral Daily   Continuous Infusions:  sodium chloride Stopped (12/08/22 1825)   furosemide 120 mg (12/11/22 0848)   PRN Meds: sodium chloride, acetaminophen **OR** acetaminophen, fluticasone furoate-vilanterol, ipratropium, levalbuterol   Vital Signs    Vitals:   12/11/22 0454 12/11/22 0735 12/11/22 0810 12/11/22 0904  BP: 103/73 91/77 107/65   Pulse: 82 71 (!) 140   Resp: 20 18    Temp: (!) 97.5 F (36.4 C) 98.2 F (36.8 C)    TempSrc: Oral Oral    SpO2: 98% 97% 99% 99%  Weight:      Height:        Intake/Output Summary (Last 24 hours) at 12/11/2022 0947 Last data filed at 12/11/2022 0830 Gross per 24 hour  Intake 832 ml  Output 5286 ml  Net -4454 ml      12/11/2022    1:21 AM 12/10/2022    3:52 AM 12/09/2022    3:05 AM  Last 3 Weights  Weight (lbs) 203 lb 7.8 oz 213 lb 6.5 oz 211 lb 3.2 oz  Weight (kg) 92.3 kg 96.8 kg 95.8 kg      Telemetry    Afib with RVR, ventricular rates up to 150s this AM - Personally Reviewed  ECG    Atrial fibrillation with RVR - Personally Reviewed  Physical Exam   GEN: No acute distress.   Neck: very mild elevation of JVP Cardiac: irregularly irregular and rapid, no murmurs, rubs, or gallops.  Respiratory: Clear to auscultation bilaterally. GI: Soft, nontender, non-distended  MS: No edema; No deformity. Neuro:  Nonfocal  Psych: Normal affect   Labs    High Sensitivity Troponin:    Recent Labs  Lab 12/07/22 1630 12/07/22 1830  TROPONINIHS 15 18*     Chemistry Recent Labs  Lab 12/09/22 0051 12/10/22 0031 12/11/22 0036  NA 140 136 134*  K 3.8 3.5 4.1  CL 100 100 96*  CO2 32 28 24  GLUCOSE 119* 124* 197*  BUN '12 13 17  '$ CREATININE 1.54* 1.47* 1.50*  CALCIUM 8.4* 8.3* 8.2*  MG 1.8 1.9 2.0  GFRNONAA 50* 53* 52*  ANIONGAP '8 8 14    '$ Lipids No results for input(s): "CHOL", "TRIG", "HDL", "LABVLDL", "LDLCALC", "CHOLHDL" in the last 168 hours.  Hematology Recent Labs  Lab 12/08/22 0701 12/09/22 0051 12/11/22 0731  WBC 8.8 9.6 10.0  RBC 3.94* 4.40 4.57  HGB 12.6* 14.1 14.9  HCT 37.8* 41.8 42.4  MCV 95.9 95.0 92.8  MCH 32.0 32.0 32.6  MCHC 33.3 33.7 35.1  RDW 15.3 15.1 14.7  PLT 150 170 200   Thyroid No results for input(s): "TSH", "FREET4" in the last 168 hours.  BNP Recent Labs  Lab 12/07/22 1534  BNP 231.0*    DDimer No results for input(s): "DDIMER" in the last 168 hours.   Radiology    DG CHEST PORT 1 VIEW  Result Date: 12/11/2022  CLINICAL DATA:  Fever cough and leg swelling EXAM: PORTABLE CHEST 1 VIEW COMPARISON:  12/07/2022, 04/02/2022 FINDINGS: Cardiomegaly. No acute airspace disease or pleural effusion. No pneumothorax. IMPRESSION: No active disease. Cardiomegaly. Electronically Signed   By: Donavan Foil M.D.   On: 12/11/2022 00:32    Cardiac Studies   12/08/22 TTE   IMPRESSIONS     1. There is severe global hypokinesis of the LV with relative sparing of  the inferolateral wall. Left ventricular ejection fraction, by estimation,  is 25 to 30%. The left ventricle has severely decreased function. The left  ventricle demonstrates global  hypokinesis. There is mild concentric left ventricular hypertrophy. Left  ventricular diastolic function could not be evaluated.   2. Right ventricular systolic function is hyperdynamic. The right  ventricular size is normal. There is mildly elevated pulmonary artery  systolic pressure.   3.  Left atrial size was moderately dilated.   4. Right atrial size was severely dilated.   5. The mitral valve is normal in structure. No evidence of mitral valve  regurgitation. No evidence of mitral stenosis.   6. Tricuspid valve regurgitation is moderate.   7. The aortic valve is normal in structure. Aortic valve regurgitation is  mild. No aortic stenosis is present.   8. The inferior vena cava is dilated in size with <50% respiratory  variability, suggesting right atrial pressure of 15 mmHg.   Patient Profile     Javier Berry is a 62 y.o. male with a hx of chronic systolic heart failure, permanent atrial fibrillation, hypertension, and CKD stage III who is being seen in consult for the evaluation of afib with RVR at the request of Dr. McDiarmid.    Assessment & Plan    Permanent atrial fibrillation with RVR   Prior to admission, patient on home regimen of Metoprolol, Digoxin, Eliquis, though has not been taking Digoxin after running out. Rate control initially improved this admission following re-load of digoxin. This morning, rates are again back up from 120s-150s. Fortunately, patient is without symptoms though given HFrEF, will need to focus on additional rate reduction. Patient is already on max dose metoprolol and digoxin level was appropriate on 3/12.   Will load Amiodarone today and discontinue Digoxin for better rate control.  Continue Metoprolol Succinate '100mg'$  BID.  Continue Eliquis   Acute on chronic systolic heart failure   Patient with hx fluctuant LVEF. EF down to 35-40% in 2016, recovered to normal by 2020 with medical therapy though was then decreased again 30-35% in July 2023 in the setting of afib with RVR. TTE this admission with LVEF 25-30%, global hypokinesis.    BNP 231.0 this admission. Patient now net negative 15L this admission.  JVP still elevated, 3cm above clavicle on physical exam with lower extremity edema R>L.  Continue IV diuresis today with lasix  '120mg'$  BID. Continue Spironolactone '25mg'$   Consider adding Farxiga at discharge, $0 per month with co-pay card   Will consider adding ARB/ARNI if BP improves.    Hypertension   BP remains low normal today.   CKD stage III   Creatinine near baseline at 1.50 today. Continue to follow closely with diuresis.      For questions or updates, please contact [place] Please consult [email or web address] for contact info under        Signed, Lily Kocher, PA-C  12/11/2022, 9:47 AM    Patient seen and examined  Agree with findings as noted by Alain Honey above  Pt comfortable  at present  He is on IV amiodarone and heart rates have improved  from earlier this morning       On exam Neck   JVP is increased     Lungs are CTA Cardiac Irreg irreg   No S3   No signif murmurs Ext with tr to 1+ edema   Continue amiodarone IV today then switch to PO   Stop Digoxin   Continue IV diuresis  Still with some increased volume on exam.  Dorris Carnes MD

## 2022-12-11 NOTE — Progress Notes (Signed)
Daily Progress Note Intern Pager: [phone]  Patient name: Javier Berry Medical record number: [id number] Date of birth: 1960/12/28 Age: 62 y.o. Gender: male  Primary Care Provider: Pcp, No Consultants: Cardiology  Code Status: Full code   Pt Overview and Major Events to Date:  3/10: Admitted   Assessment and Plan:  Javier Berry is a 62 y.o. male presenting with lower extremity edema and exertional dyspnea. Pertinent PMH/PSH includes HFrEF, A fib, HTN. CKD3, Gout, NICM, PUD.    Continues to have good UOP. Kidney function is stable but up-trending. Peripheral edema much improved. Weight down 8 kg this admission.   A fib still difficult to control. Seemed to do better yesterday, but back in RVR 120s this morning. Appreciate cardiology's help in management.   Low grade fever 100.4 overnight. Patient is asymptomatic aside from occasional sweats in the mornings. Denies cough, runny nose, difficulty breathing. CBC WNL. CXR negative. Bcx drawn last night. Will follow but patient does not appear infectious at this time. If he re-fevers will order viral panel.   * Acute on chronic systolic CHF (congestive heart failure) (HCC) Weight 101 kg > 92.3 kg. UOP - 4.4 L 3/12. Echo showing worsening LVEF 25-30% w/ severe global hypokinesis.    - daily weight - strict I&Os  HF Medications:   - Metoprolol 100 mg BID starting 3/11  - Lasix 120 mg IV BID (3/11)  - Aldactone 25 mg (3/11)  - consider adding Entresto   - consider adding SGLT2i  - Flexeril 5 mg TID PRN for leg cramping and pain, optimizing electrolytes   Atrial fibrillation with RVR (HCC) Tachycardic 120s, medications given earlier to help with rate control.  - cardiology consulted, appreciate recommendations.  - Toprol 100 mg BID - Digoxin 0.125 mg daily   Hypokalemia K and Mag stable  - recheck BMP daily  - Goal K >4, Mag > 2  SOB (shortness of breath) on exertion - Atrovent nebs - cont Breo  - PFTs outpatient    CKD (chronic kidney disease) stage 3, GFR 30-59 ml/min (HCC) Cr within baseline (1.5-1.7) - daily BMP   Fever Low grade fever to 100.4. Patient does not appear infectious at this time.  - trend fever curve  - f/u blood cultures  - viral testing if re-fevers    FEN/GI: heart healthy, fluid restriction 1800 mL  PPx: Lovenox  Dispo:Home pending clinical improvement . Barriers include ongoing medical management requiring IV medications.   Subjective:  Febrile to 100.4 overnight. Patient denies infectious symptoms. Still having occasional sweats when he wakes up in the morning.   Objective: Temp:  [97.5 F (36.4 C)-100.4 F (38 C)] 98.2 F (36.8 C) (03/14 0735) Pulse Rate:  [64-140] 140 (03/14 0810) Resp:  [18-20] 18 (03/14 0735) BP: (91-129)/(63-82) 107/65 (03/14 0810) SpO2:  [97 %-99 %] 99 % (03/14 0904) Weight:  [92.3 kg] 92.3 kg (03/14 0121) Physical Exam: Well-appearing, no acute distress Cardio: tachycardic, irregularly irregular rhythm, no murmurs on exam. Pulm: Clear, no wheezing, no crackles. No increased work of breathing Abdominal: bowel sounds present, soft, non-tender, non-distended Extremities: trace peripheral edema   Laboratory: Most recent CBC Lab Results  Component Value Date   WBC 10.0 12/11/2022   HGB 14.9 12/11/2022   HCT 42.4 12/11/2022   MCV 92.8 12/11/2022   PLT 200 12/11/2022   Most recent BMP    Latest Ref Rng & Units 12/11/2022   12:36 AM  BMP  Glucose 70 -  99 mg/dL 197   BUN 8 - 23 mg/dL 17   Creatinine 0.61 - 1.24 mg/dL 1.50   Sodium 135 - 145 mmol/L 134   Potassium 3.5 - 5.1 mmol/L 4.1   Chloride 98 - 111 mmol/L 96   CO2 22 - 32 mmol/L 24   Calcium 8.9 - 10.3 mg/dL 8.2    Imaging/Diagnostic Tests: CXR: no consolidations  Darci Current, DO 12/11/2022, 9:18 AM  PGY-1, [place] Intern pager: [phone], text pages welcome Secure chat group [place]

## 2022-12-12 DIAGNOSIS — I509 Heart failure, unspecified: Secondary | ICD-10-CM

## 2022-12-12 DIAGNOSIS — I5023 Acute on chronic systolic (congestive) heart failure: Secondary | ICD-10-CM | POA: Diagnosis not present

## 2022-12-12 LAB — BLOOD CULTURE ID PANEL (REFLEXED) - BCID2

## 2022-12-12 LAB — BASIC METABOLIC PANEL
Anion gap: 9 (ref 5–15)
BUN: 22 mg/dL (ref 8–23)
CO2: 28 mmol/L (ref 22–32)
Calcium: 8.6 mg/dL — ABNORMAL LOW (ref 8.9–10.3)
Chloride: 99 mmol/L (ref 98–111)
Creatinine, Ser: 1.68 mg/dL — ABNORMAL HIGH (ref 0.61–1.24)
GFR, Estimated: 45 mL/min — ABNORMAL LOW (ref >60)
Glucose, Bld: 113 mg/dL — ABNORMAL HIGH (ref 70–99)
Potassium: 3.8 mmol/L (ref 3.5–5.1)
Sodium: 136 mmol/L (ref 135–145)

## 2022-12-12 LAB — MAGNESIUM: Magnesium: 2.1 mg/dL (ref 1.7–2.4)

## 2022-12-12 MED ORDER — DICLOFENAC SODIUM 1 % EX GEL
2.0000 g | Freq: Four times a day (QID) | CUTANEOUS | Status: DC
  Administered 2022-12-12 – 2022-12-13 (×3): 2 g via TOPICAL
  Filled 2022-12-12: qty 100

## 2022-12-12 MED ORDER — DAPAGLIFLOZIN PROPANEDIOL 10 MG PO TABS
10.0000 mg | ORAL_TABLET | Freq: Every day | ORAL | Status: DC
  Administered 2022-12-13: 10 mg via ORAL
  Filled 2022-12-12: qty 1

## 2022-12-12 MED ORDER — POTASSIUM CHLORIDE CRYS ER 20 MEQ PO TBCR
20.0000 meq | EXTENDED_RELEASE_TABLET | Freq: Once | ORAL | Status: AC
  Administered 2022-12-12: 20 meq via ORAL
  Filled 2022-12-12: qty 1

## 2022-12-12 MED ORDER — AMIODARONE HCL 200 MG PO TABS
200.0000 mg | ORAL_TABLET | Freq: Every day | ORAL | Status: DC
  Administered 2022-12-12 – 2022-12-13 (×2): 200 mg via ORAL
  Filled 2022-12-12 (×2): qty 1

## 2022-12-12 MED ORDER — METOPROLOL SUCCINATE ER 25 MG PO TB24
25.0000 mg | ORAL_TABLET | Freq: Two times a day (BID) | ORAL | Status: DC
  Administered 2022-12-12: 25 mg via ORAL
  Filled 2022-12-12: qty 1

## 2022-12-12 NOTE — Progress Notes (Addendum)
Rounding Note    Patient Name: Javier Berry Date of Encounter: 12/12/2022  [place] Cardiologist: Kirk Ruths, MD   Subjective   Patient appears comfortable today, noted with significantly improved HR. Denies chest pain, shortness of breath, palpitations. Continues with knee pain.  Inpatient Medications    Scheduled Meds:  amiodarone  200 mg Oral Daily   apixaban  5 mg Oral BID   influenza vac split quadrivalent PF  0.5 mL Intramuscular Tomorrow-1000   metoprolol succinate  25 mg Oral BID   spironolactone  25 mg Oral Daily   Continuous Infusions:  sodium chloride Stopped (12/08/22 1825)   furosemide 120 mg (12/12/22 1057)   PRN Meds: sodium chloride, acetaminophen **OR** acetaminophen, calcium carbonate, fluticasone furoate-vilanterol, ipratropium, levalbuterol   Vital Signs    Vitals:   12/11/22 2100 12/12/22 0127 12/12/22 0812 12/12/22 1059  BP: 103/65 107/66 106/72 98/71  Pulse: (!) 57 76 63 72  Resp: 18 18 18    Temp: 99.1 F (37.3 C) 99.1 F (37.3 C) 98 F (36.7 C)   TempSrc: Oral Oral Oral   SpO2: 97% 99% 99%   Weight:  88.3 kg    Height:        Intake/Output Summary (Last 24 hours) at 12/12/2022 1116 Last data filed at 12/12/2022 0815 Gross per 24 hour  Intake 774 ml  Output 2000 ml  Net -1226 ml      12/12/2022    1:27 AM 12/11/2022    1:21 AM 12/10/2022    3:52 AM  Last 3 Weights  Weight (lbs) 194 lb 10.7 oz 203 lb 7.8 oz 213 lb 6.5 oz  Weight (kg) 88.3 kg 92.3 kg 96.8 kg      Telemetry    Atrial fibrillation, improved rate control. Some bradycardia with isolated 2 second pauses - Personally Reviewed  ECG    No new tracing - Personally Reviewed  Physical Exam   GEN: No acute distress.   Neck: No JVD Cardiac: RRR, no murmurs, rubs, or gallops.  Respiratory: Clear to auscultation bilaterally. GI: Soft, nontender, non-distended  MS: No edema; No deformity. Neuro:  Nonfocal  Psych: Normal affect   Labs    High  Sensitivity Troponin:   Recent Labs  Lab 12/07/22 1630 12/07/22 1830  TROPONINIHS 15 18*     Chemistry Recent Labs  Lab 12/10/22 0031 12/11/22 0036 12/12/22 0041  NA 136 134* 136  K 3.5 4.1 3.8  CL 100 96* 99  CO2 28 24 28   GLUCOSE 124* 197* 113*  BUN 13 17 22   CREATININE 1.47* 1.50* 1.68*  CALCIUM 8.3* 8.2* 8.6*  MG 1.9 2.0 2.1  GFRNONAA 53* 52* 45*  ANIONGAP 8 14 9     Lipids No results for input(s): "CHOL", "TRIG", "HDL", "LABVLDL", "LDLCALC", "CHOLHDL" in the last 168 hours.  Hematology Recent Labs  Lab 12/08/22 0701 12/09/22 0051 12/11/22 0731  WBC 8.8 9.6 10.0  RBC 3.94* 4.40 4.57  HGB 12.6* 14.1 14.9  HCT 37.8* 41.8 42.4  MCV 95.9 95.0 92.8  MCH 32.0 32.0 32.6  MCHC 33.3 33.7 35.1  RDW 15.3 15.1 14.7  PLT 150 170 200   Thyroid No results for input(s): "TSH", "FREET4" in the last 168 hours.  BNP Recent Labs  Lab 12/07/22 1534  BNP 231.0*    DDimer No results for input(s): "DDIMER" in the last 168 hours.   Radiology    DG CHEST PORT 1 VIEW  Result Date: 12/11/2022 CLINICAL DATA:  Fever  cough and leg swelling EXAM: PORTABLE CHEST 1 VIEW COMPARISON:  12/07/2022, 04/02/2022 FINDINGS: Cardiomegaly. No acute airspace disease or pleural effusion. No pneumothorax. IMPRESSION: No active disease. Cardiomegaly. Electronically Signed   By: Donavan Foil M.D.   On: 12/11/2022 00:32    Cardiac Studies   12/08/22 TTE   IMPRESSIONS     1. There is severe global hypokinesis of the LV with relative sparing of  the inferolateral wall. Left ventricular ejection fraction, by estimation,  is 25 to 30%. The left ventricle has severely decreased function. The left  ventricle demonstrates global  hypokinesis. There is mild concentric left ventricular hypertrophy. Left  ventricular diastolic function could not be evaluated.   2. Right ventricular systolic function is hyperdynamic. The right  ventricular size is normal. There is mildly elevated pulmonary artery   systolic pressure.   3. Left atrial size was moderately dilated.   4. Right atrial size was severely dilated.   5. The mitral valve is normal in structure. No evidence of mitral valve  regurgitation. No evidence of mitral stenosis.   6. Tricuspid valve regurgitation is moderate.   7. The aortic valve is normal in structure. Aortic valve regurgitation is  mild. No aortic stenosis is present.   8. The inferior vena cava is dilated in size with <50% respiratory  variability, suggesting right atrial pressure of 15 mmHg.   Patient Profile     Javier Berry is a 62 y.o. male with a hx of chronic systolic heart failure, permanent atrial fibrillation, hypertension, and CKD stage III who is being seen in consult for the evaluation of afib with RVR at the request of Dr. McDiarmid.    Assessment & Plan    Permanent atrial fibrillation with RVR   Prior to admission, patient on home regimen of Metoprolol, Digoxin, Eliquis, though has not been taking Digoxin after running out. Rate control initially improved this admission following re-load of digoxin. Yesterday morning, rates again back up to 120s-150s. Fortunately, patient is without symptoms though given HFrEF, will need to focus on additional rate reduction. IV Amiodarone load initiated with improved rate control   Transition to Amiodarone 200mg  PO QD. Hold metoprolol this morning with intermittent bradycardia. Will assess rates/BP throughout today and tentatively plan to resume at reduced dose/frequency of Toprol XL 25mg  BID, down from 100mg  Continue Eliquis   Acute on chronic systolic heart failure   Patient with hx fluctuant LVEF. EF down to 35-40% in 2016, recovered to normal by 2020 with medical therapy though was then decreased again 30-35% in July 2023 in the setting of afib with RVR. TTE this admission with LVEF 25-30%, global hypokinesis.    BNP 231.0 this admission. Patient now net negative 17L this admission.  JVP no longer  appreciably elevated above clavicle, patient appears to be near euvolemic. Will d/c IV lasix evening dose given increasing creatinine. Unclear whether patient will require daily vs PRN loop diuretics at discharge. Volume status/creatinine in AM tomorrow should help inform. Plan to initiate [place] tomorrow. $0 per month with co-pay card   Continue Spironolactone 25mg   Will consider adding ARB/ARNI if BP improves.    Hypertension   BP remains low normal today.   CKD stage III   Creatinine up to 1.68 today. Diuresis as above. Continue to follow renal function closely.      For questions or updates, please contact [place] Please consult [email or web address] for contact info under        Signed, Ellard Artis  Vara Guardian  12/12/2022, 11:16 AM    Personally seen and examined. Agree with above.  62 year old with acute on chronic systolic heart failure ejection fraction 25 to 30%, atrial fibrillation RVR, hypertension, chronic kidney disease stage IIIa.  Creatinine 1.68.  -Agree with transitioning over to amiodarone 200 mg daily. -Eliquis -Agree with reducing Toprol to 25 twice daily given intermittent bradycardia. -Agree with Wilder Glade initiation tomorrow.  Continue with spironolactone 25 mg.  Likely will start Entresto as outpatient as long as blood pressure will tolerate.  Currently low normal.  Candee Furbish, MD

## 2022-12-12 NOTE — Progress Notes (Signed)
PT Cancellation Note  Patient Details Name: Javier Berry MRN: [id number] DOB: 09/04/1961   Cancelled Treatment:    Reason Eval/Treat Not Completed: Other (comment) (Patient politely declined, just walked to and from bathroom with staff assistance with reported knee pain flair. PT will continue with attempts)  Minna Merritts, PT, MPT  Percell Locus 12/12/2022, 2:36 PM

## 2022-12-12 NOTE — Progress Notes (Signed)
PHARMACY - PHYSICIAN COMMUNICATION CRITICAL VALUE ALERT - BLOOD CULTURE IDENTIFICATION (BCID)  Javier Berry is an 62 y.o. male who presented to [facility] on 12/07/2022 with a chief complaint of leg swelling and shortness of breath.   Assessment:  Patient had a Tmax of 100.4 on 3/13 so blood cultures were collected now showing 1/4 staph species. This is likely a contaminant.   Name of physician (or Provider) Contacted: FM team   Current antibiotics: None   Changes to prescribed antibiotics recommended:  None based on this culture   No results found for this or any previous visit.  Jimmy Footman, PharmD, BCPS, BCIDP Infectious Diseases Clinical Pharmacist Phone: [phone] 12/12/2022  1:25 PM

## 2022-12-12 NOTE — TOC Progression Note (Signed)
Transition of Care [facility]) - Progression Note    Patient Details  Name: ARDIE FRACASSO MRN: [id number] Date of Birth: 1961-02-06  Transition of Care [facility]) CM/SW Contact  Zenon Mayo, RN Phone Number: 12/12/2022, 5:06 PM  Clinical Narrative:    From home with wife, NCM offered choice for [facility] services and DME, he states he has no preference, NCM made referral to [facility] with [facility] for [place], [place], he is able to take referral, soc will begin 24 to 48 hrs post dc.  NCM made referral for rolling walker to [place] with [employer], she states they will deliver to patient's room tomorrow.  Wife will transport him home at dc.    Expected Discharge Plan: [place] Barriers to Discharge: Continued Medical Work up  Expected Discharge Plan and Services In-house Referral: NA Discharge Planning Services: CM Consult Post Acute Care Choice: Home Health, Durable Medical Equipment Living arrangements for the past 2 months: Single Family Home                 DME Arranged: Walker rolling DME Agency: [employer] Date DME Agency Contacted: 12/12/22 Time DME Agency Contacted: [id number] Representative spoke with at DME Agency: [place]: PT, OT [place] Agency: [place] Date [place]: 12/12/22 Time [place]: 1705 Representative spoke with at [place]: [place] ([place]) Interventions SDOH Screenings   Food Insecurity: No Food Insecurity (12/07/2022)  Housing: Low Risk  (12/09/2022)  Recent Concern: Housing - Medium Risk (12/07/2022)  Transportation Needs: No Transportation Needs (12/07/2022)  Utilities: Not At Risk (12/07/2022)  Alcohol Screen: Low Risk  (12/09/2022)  Financial Resource Strain: Low Risk  (12/09/2022)  Tobacco Use: Low Risk  (12/09/2022)    Readmission Risk Interventions     No data to display

## 2022-12-12 NOTE — Progress Notes (Signed)
     Daily Progress Note Intern Pager: [phone]  Patient name: Javier Berry Medical record number: [id number] Date of birth: December 02, 1960 Age: 62 y.o. Gender: male  Primary Care Provider: Pcp, No Consultants: Cardiology  Code Status: Full code   Pt Overview and Major Events to Date:  3/10: Admitted   Assessment and Plan:  Javier Berry is a 62 y.o. male presenting with lower extremity edema and exertional dyspnea. Pertinent PMH/PSH includes HFrEF, A fib, HTN. CKD3, Gout, NICM, PUD.   * Acute on chronic systolic CHF (congestive heart failure) (HCC) Weight 101 kg > 88.3 kg. UOP - 1.8 L 3/14. Echo showing worsening LVEF 25-30% w/ severe global hypokinesis.    - daily weight - strict I&Os  HF Medications:   - Metoprolol 100 mg BID starting 3/11  - Lasix 120 mg IV BID (3/11)  - Aldactone 25 mg (3/11)  - consider adding Entresto   - consider adding SGLT2i  - Flexeril 5 mg TID PRN for leg cramping and pain, optimizing electrolytes   Atrial fibrillation with RVR (HCC) Rate controlled  - cardiology consulted, appreciate recommendations.  - Toprol 100 mg BID - Dc'd Digoxin 0.125 mg daily (3/14) - started amiodarone load (3/14), pressures stable but soft  Hypokalemia K and Mag stable  - recheck BMP daily  - Goal K >4, Mag > 2 - ordered 20 mEq K-Lor 3/15  SOB (shortness of breath) on exertion - Atrovent nebs - cont Breo  - PFTs outpatient   CKD (chronic kidney disease) stage 3, GFR 30-59 ml/min (HCC) Cr within baseline (1.5-1.7) - daily BMP   Fever Afebrile overnight Bcx negative <12 hrs - trend fever curve  - f/u blood cultures  - viral testing if re-fevers    FEN/GI: heart healthy, fluid restriction 1800 mL  PPx: Lovenox  Dispo:Home pending clinical improvement . Barriers include ongoing medical management requiring IV medications.   Subjective:  NAEO, resting comfortably   Objective: Temp:  [98 F (36.7 C)-99.1 F (37.3 C)] 98 F (36.7 C) (03/15  0812) Pulse Rate:  [57-93] 63 (03/15 0812) Resp:  [18-20] 18 (03/15 0812) BP: (100-107)/(63-75) 106/72 (03/15 0812) SpO2:  [97 %-100 %] 99 % (03/15 0812) Weight:  [88.3 kg] 88.3 kg (03/15 0127) Physical Exam: Well-appearing, no acute distress Cardio: Regular rate, irregularly irregular rhythm, no murmurs on exam. Pulm: Clear, no wheezing, no crackles. No increased work of breathing Abdominal: bowel sounds present, soft, non-tender, non-distended Extremities: trace peripheral edema   Laboratory: Most recent CBC Lab Results  Component Value Date   WBC 10.0 12/11/2022   HGB 14.9 12/11/2022   HCT 42.4 12/11/2022   MCV 92.8 12/11/2022   PLT 200 12/11/2022   Most recent BMP    Latest Ref Rng & Units 12/12/2022   12:41 AM  BMP  Glucose 70 - 99 mg/dL 113   BUN 8 - 23 mg/dL 22   Creatinine 0.61 - 1.24 mg/dL 1.68   Sodium 135 - 145 mmol/L 136   Potassium 3.5 - 5.1 mmol/L 3.8   Chloride 98 - 111 mmol/L 99   CO2 22 - 32 mmol/L 28   Calcium 8.9 - 10.3 mg/dL 8.6    Darci Current, DO 12/12/2022, 9:31 AM  PGY-1, [place] Intern pager: [phone], text pages welcome Secure chat group [place]

## 2022-12-12 NOTE — Progress Notes (Signed)
   Heart Failure Stewardship Pharmacist Progress Note   PCP: Pcp, No PCP-Cardiologist: Kirk Ruths, MD    HPI:  62 yo M with PMH of HTN, CHF, CKD, and afib.   Presented to the ED on 3/10 with LE edema, shortness of breath, weight gain, with cough and congestion. Reports he has been off digoxin for several months but was still taking metoprolol and Elliquis. In afib RVR on arrival. CXR with enlargement of the cardiopericardial silhouette without acute cardiopulmonary findings. EF was 35% in 2016 and 2018, improved to 55-60% in 2020, back down to 30-35% in 2023 after being lost to follow up with cardiology and was no longer taking meds. ECHO this admission showed LVEF 25-30%, global hypokinesis, mild concentric LVH, RV hyperdynamic, and mildly elevated PA pressures. Started on IV amiodarone and discontinued digoxin for afib RVR. Rate is now controlled and transitioned to PO amiodarone.    Current HF Medications: Beta Blocker: metoprolol XL 25 mg BID MRA: spironolactone 25 mg daily SGLT2i: Farxiga 10 mg daily  Prior to admission HF Medications: Diuretic: furosemide 40 mg daily Beta blocker: metoprolol XL 25 mg daily  Pertinent Lab Values: Serum creatinine 1.68, BUN 22, Potassium 3.8, Sodium 136, BNP 231, Magnesium 2.1, Digoxin level 0.7  Vital Signs: Weight: 194 lbs (admission weight: 222 lbs) Blood pressure: 90-100/70s  Heart rate: 60-70s I/O: -4.3L yesterday; net -17L  Medication Assistance / Insurance Benefits Check: Does the patient have prescription insurance?  Yes Type of insurance plan: [place] state health plan  Outpatient Pharmacy:  Prior to admission outpatient pharmacy: [facility] Is the patient willing to use [place] pharmacy at discharge? Yes Is the patient willing to transition their outpatient pharmacy to utilize a [facility] outpatient pharmacy?   Pending    Assessment: 1. Acute on chronic systolic CHF (LVEF [id number]), due to NICM. NYHA class II symptoms. -  Off IV lasix. Strict I/Os and daily weights. Keep K>4 and Mg>2.  - Agree with reducing to metoprolol XL 25 mg BID with intermittent bradycardia. Can consider transitioning to once daily if able. - Continue spironolactone 25 mg daily - Agree with adding Farxiga 10 mg daily - Received digoxin 0.25 mg IV on 3/10 and 3/11. Digoxin 0.125 mg daily started on 3/11. Level on 3/12 was ok 0.7. Now off digoxin and started on IV amiodarone for afib RVR.   Plan: 1) Medication changes recommended at this time: - Agree with changes   2) Patient assistance: - Farxiga copay $47 - copay card lowers to $0 per month  3)  Education  - Patient has been educated on current HF medications and potential additions to HF medication regimen - Patient verbalizes understanding that over the next few months, these medication doses may change and more medications may be added to optimize HF regimen - Patient has been educated on basic disease state pathophysiology and goals of therapy   Kerby Nora, PharmD, BCPS Heart Failure Stewardship Pharmacist Phone [phone]

## 2022-12-12 NOTE — Plan of Care (Signed)

## 2022-12-13 LAB — BASIC METABOLIC PANEL
Anion gap: 9 (ref 5–15)
BUN: 20 mg/dL (ref 8–23)
CO2: 27 mmol/L (ref 22–32)
Calcium: 8.5 mg/dL — ABNORMAL LOW (ref 8.9–10.3)
Chloride: 98 mmol/L (ref 98–111)
Creatinine, Ser: 1.46 mg/dL — ABNORMAL HIGH (ref 0.61–1.24)
GFR, Estimated: 54 mL/min — ABNORMAL LOW (ref >60)
Glucose, Bld: 111 mg/dL — ABNORMAL HIGH (ref 70–99)
Potassium: 3.9 mmol/L (ref 3.5–5.1)
Sodium: 134 mmol/L — ABNORMAL LOW (ref 135–145)

## 2022-12-13 LAB — MAGNESIUM: Magnesium: 2 mg/dL (ref 1.7–2.4)

## 2022-12-13 MED ORDER — SPIRONOLACTONE 25 MG PO TABS: 25.0000 mg | ORAL_TABLET | Freq: Every day | ORAL | 0 refills | Status: DC

## 2022-12-13 MED ORDER — AMIODARONE HCL 200 MG PO TABS: 200.0000 mg | ORAL_TABLET | Freq: Every day | ORAL | 0 refills | Status: DC

## 2022-12-13 MED ORDER — METOPROLOL SUCCINATE ER 50 MG PO TB24: 50.0000 mg | ORAL_TABLET | Freq: Every day | ORAL | 0 refills | Status: DC

## 2022-12-13 MED ORDER — DAPAGLIFLOZIN PROPANEDIOL 10 MG PO TABS: 10.0000 mg | ORAL_TABLET | Freq: Every day | ORAL | 0 refills | Status: DC

## 2022-12-13 MED ORDER — METOPROLOL SUCCINATE ER 50 MG PO TB24
50.0000 mg | ORAL_TABLET | Freq: Every day | ORAL | Status: DC
  Administered 2022-12-13: 50 mg via ORAL
  Filled 2022-12-13: qty 1

## 2022-12-13 NOTE — Progress Notes (Signed)
Occupational Therapy Treatment Patient Details Name: Javier Berry MRN: [id number] DOB: 25-May-1961 Today's Date: 12/13/2022   History of present illness Pt is 62 yo male who presents on 12/07/22 with  cough and congestion x 1 month, increase in leg swelling x 3 weeks. Reported to taking lasix as prescribed, however, stopped taking his digoxin a few months ago. Pt in Afib with RVR. PMH: CHF, CKD3, HTN, gout, NICM, Afib, PUD   OT comments  Pt progressing towards acute OT goals. Able to walk just into the hall a bit utilizing walker for stability, extra time and effort. Pt needed min - mod A to stand from recliner. Pt happy that he was able to walk today, acknowledged current decreased activity tolerance. This OT left a message for spouse to inform of his increased level of assist needed. Per pt spouse is able to physically assist at home. D/c recommendation remains the appropriate.    Recommendations for follow up therapy are one component of a multi-disciplinary discharge planning process, led by the attending physician.  Recommendations may be updated based on patient status, additional functional criteria and insurance authorization.    Follow Up Recommendations  Home health OT     Assistance Recommended at Discharge Set up Supervision/Assistance  Patient can return home with the following  Assist for transportation;Assistance with cooking/housework   Equipment Recommendations  BSC/3in1    Recommendations for Other Services      Precautions / Restrictions Precautions Precautions: Fall;Other (comment) Precaution Comments: Watch HR Restrictions Weight Bearing Restrictions: No       Mobility Bed Mobility               General bed mobility comments: up in chair at start and end of session    Transfers Overall transfer level: Needs assistance Equipment used: Rolling walker (2 wheels) Transfers: Sit to/from Stand Sit to Stand: Min assist, Mod assist            General transfer comment: to/from recliner. assist to powerup and to control descent. Extra time and effort. Pt currently needs walker.     Balance Overall balance assessment: Needs assistance Sitting-balance support: Feet supported Sitting balance-Leahy Scale: Good     Standing balance support: During functional activity, Bilateral upper extremity supported Standing balance-Leahy Scale: Poor Standing balance comment: cannot balance without UE support                           ADL either performed or assessed with clinical judgement   ADL                                              Extremity/Trunk Assessment Upper Extremity Assessment Upper Extremity Assessment: Overall WFL for tasks assessed;Generalized weakness   Lower Extremity Assessment Lower Extremity Assessment: Defer to PT evaluation        Vision       Perception     Praxis      Cognition Arousal/Alertness: Awake/alert Behavior During Therapy: WFL for tasks assessed/performed Overall Cognitive Status: Within Functional Limits for tasks assessed                                          Exercises      Shoulder  Instructions       General Comments HR 107 post walking in the room    Pertinent Vitals/ Pain       Pain Assessment Pain Assessment: Faces Faces Pain Scale: Hurts even more Pain Location: B knees Pain Descriptors / Indicators: Aching, Grimacing, Guarding Pain Intervention(s): Limited activity within patient's tolerance, Monitored during session, Repositioned  Home Living                                          Prior Functioning/Environment              Frequency  Min 2X/week        Progress Toward Goals  OT Goals(current goals can now be found in the care plan section)  Progress towards OT goals: Progressing toward goals  Acute Rehab OT Goals Patient Stated Goal: home OT Goal Formulation: With  patient Time For Goal Achievement: 12/25/22 Potential to Achieve Goals: Good ADL Goals Pt Will Perform Grooming: with modified independence;standing Pt Will Perform Lower Body Dressing: with modified independence;sit to/from stand Pt Will Transfer to Toilet: with modified independence;ambulating;regular height toilet  Plan Discharge plan remains appropriate    Co-evaluation                 AM-PAC OT "6 Clicks" Daily Activity     Outcome Measure   Help from another person eating meals?: None Help from another person taking care of personal grooming?: A Little Help from another person toileting, which includes using toliet, bedpan, or urinal?: A Little Help from another person bathing (including washing, rinsing, drying)?: A Lot Help from another person to put on and taking off regular upper body clothing?: None Help from another person to put on and taking off regular lower body clothing?: A Lot 6 Click Score: 18    End of Session Equipment Utilized During Treatment: Rolling walker (2 wheels)  OT Visit Diagnosis: Unsteadiness on feet (R26.81);Muscle weakness (generalized) (M62.81);Pain   Activity Tolerance Patient tolerated treatment well;Patient limited by pain   Patient Left in chair;with call bell/phone within reach   Nurse Communication          Time: 1205-1222 OT Time Calculation (min): 17 min  Charges: OT General Charges $OT Visit: 1 Visit OT Treatments $Self Care/Home Management : 8-22 mins  Tyrone Schimke, OT Acute Rehabilitation Services Office: [phone]   Hortencia Pilar 12/13/2022, 2:10 PM

## 2022-12-13 NOTE — Progress Notes (Signed)
Rounding Note    Patient Name: Javier Berry Date of Encounter: 12/13/2022  [place] Cardiologist: Kirk Ruths, MD   Subjective   Laying flat in bed, comfortable.  No chest pain, no shortness of breath.  Inpatient Medications    Scheduled Meds:  amiodarone  200 mg Oral Daily   apixaban  5 mg Oral BID   dapagliflozin propanediol  10 mg Oral Daily   diclofenac Sodium  2 g Topical QID   influenza vac split quadrivalent PF  0.5 mL Intramuscular Tomorrow-1000   metoprolol succinate  25 mg Oral BID   spironolactone  25 mg Oral Daily   Continuous Infusions:  sodium chloride Stopped (12/08/22 1825)   PRN Meds: sodium chloride, acetaminophen **OR** acetaminophen, calcium carbonate, fluticasone furoate-vilanterol, ipratropium, levalbuterol   Vital Signs    Vitals:   12/12/22 2028 12/12/22 2356 12/13/22 0407 12/13/22 0813  BP: 104/71 (!) 104/57 110/76 102/66  Pulse: 80 79 79 88  Resp: 18 18 18 20   Temp: 98.6 F (37 C) 99.3 F (37.4 C) 98.8 F (37.1 C) 98.4 F (36.9 C)  TempSrc: Oral Oral Oral Oral  SpO2: 97% 96% 97% 98%  Weight:  88.2 kg    Height:        Intake/Output Summary (Last 24 hours) at 12/13/2022 0838 Last data filed at 12/13/2022 0815 Gross per 24 hour  Intake 1190.31 ml  Output 1800 ml  Net -609.69 ml      12/12/2022   11:56 PM 12/12/2022    1:27 AM 12/11/2022    1:21 AM  Last 3 Weights  Weight (lbs) 194 lb 7.1 oz 194 lb 10.7 oz 203 lb 7.8 oz  Weight (kg) 88.2 kg 88.3 kg 92.3 kg      Telemetry    Atrial fibrillation mostly in the 90s.- Personally Reviewed  ECG    No new- Personally Reviewed  Physical Exam   GEN: No acute distress.   Neck: No JVD Cardiac: Irregularly irregular, no murmurs, rubs, or gallops.  Respiratory: Clear to auscultation bilaterally. GI: Soft, nontender, non-distended  MS: No edema; No deformity. Neuro:  Nonfocal  Psych: Normal affect   Labs    High Sensitivity Troponin:   Recent Labs  Lab  12/07/22 1630 12/07/22 1830  TROPONINIHS 15 18*     Chemistry Recent Labs  Lab 12/11/22 0036 12/12/22 0041 12/13/22 0111  NA 134* 136 134*  K 4.1 3.8 3.9  CL 96* 99 98  CO2 24 28 27   GLUCOSE 197* 113* 111*  BUN 17 22 20   CREATININE 1.50* 1.68* 1.46*  CALCIUM 8.2* 8.6* 8.5*  MG 2.0 2.1 2.0  GFRNONAA 52* 45* 54*  ANIONGAP 14 9 9     Lipids No results for input(s): "CHOL", "TRIG", "HDL", "LABVLDL", "LDLCALC", "CHOLHDL" in the last 168 hours.  Hematology Recent Labs  Lab 12/08/22 0701 12/09/22 0051 12/11/22 0731  WBC 8.8 9.6 10.0  RBC 3.94* 4.40 4.57  HGB 12.6* 14.1 14.9  HCT 37.8* 41.8 42.4  MCV 95.9 95.0 92.8  MCH 32.0 32.0 32.6  MCHC 33.3 33.7 35.1  RDW 15.3 15.1 14.7  PLT 150 170 200   Thyroid No results for input(s): "TSH", "FREET4" in the last 168 hours.  BNP Recent Labs  Lab 12/07/22 1534  BNP 231.0*    DDimer No results for input(s): "DDIMER" in the last 168 hours.   Radiology    No results found.  Cardiac Studies   Cardiac Studies & Procedures  STRESS TESTS  NM MYOCAR MULTI W/SPECT W 02/06/2015   ECHOCARDIOGRAM  ECHOCARDIOGRAM COMPLETE 12/08/2022  Narrative ECHOCARDIOGRAM REPORT    Patient Name:   Javier Berry Date of Exam: 12/08/2022 Medical Rec #:  [id number]      Height:       72.0 in Accession #:    [id number]     Weight:       222.7 lb Date of Birth:  27-Nov-1960      BSA:          2.230 m Patient Age:    62 years       BP:           123/73 mmHg Patient Gender: M              HR:           113 bpm. Exam Location:  Inpatient  Procedure: 2D Echo, 3D Echo, Strain Analysis, Cardiac Doppler and Color Doppler  Indications:    CHF-Acute Systolic [id number]  History:        Patient has prior history of Echocardiogram examinations, most recent 04/03/2022. CHF, Arrythmias:Atrial Fibrillation, Signs/Symptoms:Shortness of Breath and Edema; Risk Factors:Hypertension.  Sonographer:    Wilkie Aye RVT RCS Referring Phys: [id number] [place]   1. There is severe global hypokinesis of the LV with relative sparing of the inferolateral wall. Left ventricular ejection fraction, by estimation, is 25 to 30%. The left ventricle has severely decreased function. The left ventricle demonstrates global hypokinesis. There is mild concentric left ventricular hypertrophy. Left ventricular diastolic function could not be evaluated. 2. Right ventricular systolic function is hyperdynamic. The right ventricular size is normal. There is mildly elevated pulmonary artery systolic pressure. 3. Left atrial size was moderately dilated. 4. Right atrial size was severely dilated. 5. The mitral valve is normal in structure. No evidence of mitral valve regurgitation. No evidence of mitral stenosis. 6. Tricuspid valve regurgitation is moderate. 7. The aortic valve is normal in structure. Aortic valve regurgitation is mild. No aortic stenosis is present. 8. The inferior vena cava is dilated in size with <50% respiratory variability, suggesting right atrial pressure of 15 mmHg.  FINDINGS Left Ventricle: There is severe global hypokinesis of the LV with relative sparing of the inferolateral wall. Left ventricular ejection fraction, by estimation, is 25 to 30%. The left ventricle has severely decreased function. The left ventricle demonstrates global hypokinesis. The left ventricular internal cavity size was normal in size. There is mild concentric left ventricular hypertrophy. Left ventricular diastolic function could not be evaluated due to atrial fibrillation. Left ventricular diastolic function could not be evaluated.  Right Ventricle: The right ventricular size is normal. No increase in right ventricular wall thickness. Right ventricular systolic function is hyperdynamic. There is mildly elevated pulmonary artery systolic pressure. The tricuspid regurgitant velocity is 2.26 m/s, and with an assumed right atrial pressure of 15 mmHg, the  estimated right ventricular systolic pressure is [id number] mmHg.  Left Atrium: Left atrial size was moderately dilated.  Right Atrium: Right atrial size was severely dilated.  Pericardium: Trivial pericardial effusion is present. The pericardial effusion is localized near the right atrium.  Mitral Valve: The mitral valve is normal in structure. No evidence of mitral valve regurgitation. No evidence of mitral valve stenosis.  Tricuspid Valve: The tricuspid valve is normal in structure. Tricuspid valve regurgitation is moderate . No evidence of tricuspid stenosis.  Aortic Valve: The aortic valve is normal in structure. Aortic valve regurgitation  is mild. No aortic stenosis is present. Aortic valve mean gradient measures 3.3 mmHg. Aortic valve peak gradient measures 6.3 mmHg. Aortic valve area, by VTI measures 3.72 cm.  Pulmonic Valve: The pulmonic valve was normal in structure. Pulmonic valve regurgitation is not visualized. No evidence of pulmonic stenosis.  Aorta: The aortic root is normal in size and structure.  Venous: The inferior vena cava is dilated in size with less than 50% respiratory variability, suggesting right atrial pressure of 15 mmHg.  IAS/Shunts: No atrial level shunt detected by color flow Doppler.   LEFT VENTRICLE PLAX 2D LVIDd:         5.60 cm LVIDs:         4.50 cm LV PW:         1.40 cm LV IVS:        0.90 cm LVOT diam:     2.10 cm   3D Volume EF: LV SV:         66        3D EF:        46 % LV SV Index:   30        LV EDV:       177 ml LVOT Area:     3.46 cm  LV ESV:       96 ml LV SV:        81 ml  RIGHT VENTRICLE          IVC RV Basal diam:  4.50 cm  IVC diam: 2.00 cm RV Mid diam:    4.00 cm  LEFT ATRIUM              Index        RIGHT ATRIUM           Index LA diam:        4.90 cm  2.20 cm/m   RA Area:     38.50 cm LA Vol (A2C):   119.0 ml 53.36 ml/m  RA Volume:   169.00 ml 75.78 ml/m LA Vol (A4C):   98.2 ml  44.04 ml/m LA Biplane Vol: 116.0 ml  52.02 ml/m AORTIC VALVE                    PULMONIC VALVE AV Area (Vmax):    3.78 cm     PV Vmax:       1.05 m/s AV Area (Vmean):   3.69 cm     PV Peak grad:  4.4 mmHg AV Area (VTI):     3.72 cm AV Vmax:           125.67 cm/s AV Vmean:          83.200 cm/s AV VTI:            0.177 m AV Peak Grad:      6.3 mmHg AV Mean Grad:      3.3 mmHg LVOT Vmax:         137.00 cm/s LVOT Vmean:        88.567 cm/s LVOT VTI:          0.190 m LVOT/AV VTI ratio: 1.08  AORTA Ao Root diam: 2.70 cm Ao Asc diam:  3.30 cm  MITRAL VALVE               TRICUSPID VALVE MV Area (PHT): 3.46 cm    TR Peak grad:   20.4 mmHg MV Decel Time: 220 msec    TR Vmax:  226.00 cm/s MV E velocity: 68.80 cm/s SHUNTS Systemic VTI:  0.19 m Systemic Diam: 2.10 cm  Glori Bickers MD Electronically signed by Glori Bickers MD Signature Date/Time: 12/08/2022/1:08:08 PM    Final    MONITORS  LONG TERM MONITOR (3-14 DAYS) 05/06/2019  Narrative Atrial fibrillation with PVCs or aberrantly conducted beats, rate reasonably well controlled. Kirk Ruths            Patient Profile     62 y.o. male with acute on chronic systolic heart failure ejection fraction 25 to 30%, atrial fibrillation RVR, hypertension, chronic kidney disease stage IIIa.   Assessment & Plan    Acute on chronic systolic heart failure - Ejection fraction 25 to 30%, previously 35 to 40% in 2016. - BNP 231 on this admission currently -18.3 L -Creatinine down from 1.68-1.46.  Weight 88.2 kg. -Potassium 3.9 -On Farxiga, SGLT2 inhibitor 10 mg a day - Spironolactone 25 mg a day - Metoprolol succinate 25 mg twice a day, we will consolidate to 50 mg daily.  Previously at home he was on 100 mg.  He did have transient bradycardia. -Blood pressure still fairly soft 102/66, we will hold off on Entresto.  Paroxysmal atrial fibrillation - On amiodarone 200 mg a day, Eliquis 5 mg twice a day. -Prior to admission he was taking digoxin for  some time but thought that he had not been taking it for several weeks after running out.  Rates here were not well-controlled on digoxin therefore we transition to IV amiodarone and stopped digoxin.   Hypertension - Blood pressure under excellent control.  Chronic kidney disease stage IIIa - Creatinine as high as 1.68 during this admission, currently 1.5.  I would be comfortable with his discharge.  We will get close follow-up.    For questions or updates, please contact [place] Please consult [email or web address] for contact info under        Signed, Candee Furbish, MD  12/13/2022, 8:38 AM

## 2022-12-13 NOTE — Assessment & Plan Note (Signed)
Patient has remained afebrile since low grade fever 2 days ago. Work up for infectious causes have been negative. Blood culture reflect staph specie which is likely a contaminant. No indication for antibiotic therapy at this time.

## 2022-12-13 NOTE — Progress Notes (Signed)
Physical Therapy Treatment Patient Details Name: Javier Berry MRN: [id number] DOB: 1960/12/07 Today's Date: 12/13/2022   History of Present Illness Pt is 62 yo male who presents on 12/07/22 with  cough and congestion x 1 month, increase in leg swelling x 3 weeks. Reported to taking lasix as prescribed, however, stopped taking his digoxin a few months ago. Pt in Afib with RVR. PMH: CHF, CKD3, HTN, gout, NICM, Afib, PUD    PT Comments    PT focus of session on mobility progression, determining if pt is safe to d/c home. Pt mobilizing out of recliner and in hallway with close guard for safety only, pt requires significantly increased time given bilat knee pain R>L but PT did not physically assist pt at any point during session. Per pt, he has a son who lives closeby who can help him in the house if needed (PT simulated stairs in hallway with single rail - pt tolerated well with increased time and declines need to go to stairwell), and his wife will assist him once d/c home. PT instructed pt in LE exercise to decrease bilat knee stiffness, pt performs well. Pt plans to d/c home today.     Recommendations for follow up therapy are one component of a multi-disciplinary discharge planning process, led by the attending physician.  Recommendations may be updated based on patient status, additional functional criteria and insurance authorization.  Follow Up Recommendations  Home health PT     Assistance Recommended at Discharge Intermittent Supervision/Assistance  Patient can return home with the following A little help with walking and/or transfers;A little help with bathing/dressing/bathroom;Assistance with cooking/housework;Assist for transportation;Help with stairs or ramp for entrance   Equipment Recommendations  Rolling walker (2 wheels)    Recommendations for Other Services       Precautions / Restrictions Precautions Precautions: Fall Precaution Comments: Watch HR Restrictions Weight  Bearing Restrictions: No     Mobility  Bed Mobility               General bed mobility comments: up in chair at start and end of session    Transfers Overall transfer level: Needs assistance Equipment used: Rolling walker (2 wheels) Transfers: Sit to/from Stand             General transfer comment: close guard for safety, slow to rise but PT did not physically assist.    Ambulation/Gait Ambulation/Gait assistance: Min guard Gait Distance (Feet): 70 Feet Assistive device: Rolling walker (2 wheels) Gait Pattern/deviations: Step-through pattern, Antalgic, Decreased stride length, Decreased weight shift to right Gait velocity: decr     General Gait Details: close guard for safety, cues for upright posture, placement in RW to offweight LEs as needed.   Stairs             Wheelchair Mobility    Modified Rankin (Stroke Patients Only)       Balance Overall balance assessment: Needs assistance Sitting-balance support: Feet supported Sitting balance-Leahy Scale: Good     Standing balance support: During functional activity, Bilateral upper extremity supported, Reliant on assistive device for balance Standing balance-Leahy Scale: Poor Standing balance comment: cannot balance without UE support                            Cognition Arousal/Alertness: Awake/alert Behavior During Therapy: WFL for tasks assessed/performed Overall Cognitive Status: Within Functional Limits for tasks assessed  Exercises General Exercises - Lower Extremity Long Arc Quad: AROM, Both, 10 reps, Seated    General Comments General comments (skin integrity, edema, etc.): HR 107 post walking in the room      Pertinent Vitals/Pain Pain Assessment Pain Assessment: Faces Faces Pain Scale: Hurts even more Pain Location: B knees Pain Descriptors / Indicators: Aching, Grimacing, Guarding Pain Intervention(s):  Limited activity within patient's tolerance, Monitored during session, Repositioned    Home Living                          Prior Function            PT Goals (current goals can now be found in the care plan section) Acute Rehab PT Goals Patient Stated Goal: return to home and work PT Goal Formulation: With patient/family Time For Goal Achievement: 12/23/22 Potential to Achieve Goals: Good Progress towards PT goals: Progressing toward goals    Frequency    Min 3X/week      PT Plan Current plan remains appropriate    Co-evaluation              AM-PAC PT "6 Clicks" Mobility   Outcome Measure  Help needed turning from your back to your side while in a flat bed without using bedrails?: A Little Help needed moving from lying on your back to sitting on the side of a flat bed without using bedrails?: A Little Help needed moving to and from a bed to a chair (including a wheelchair)?: A Little Help needed standing up from a chair using your arms (e.g., wheelchair or bedside chair)?: A Little Help needed to walk in hospital room?: A Little Help needed climbing 3-5 steps with a railing? : A Little 6 Click Score: 18    End of Session Equipment Utilized During Treatment: Gait belt Activity Tolerance: Patient tolerated treatment well Patient left: with call bell/phone within reach;in chair Nurse Communication: Mobility status PT Visit Diagnosis: Unsteadiness on feet (R26.81);Muscle weakness (generalized) (M62.81);Difficulty in walking, not elsewhere classified (R26.2);Pain Pain - Right/Left: Right Pain - part of body: Leg     Time: [id number] PT Time Calculation (min) (ACUTE ONLY): 18 min  Charges:  $Gait Training: 8-22 mins                     Stacie Glaze, PT DPT Acute Rehabilitation Services Pager [phone]  Office [phone]    Roxine Caddy E Ruffin Pyo 12/13/2022, 2:33 PM

## 2022-12-13 NOTE — Discharge Instructions (Addendum)
Dear Javier Berry,   Thank you for letting us participate in your care! In this section, you will find a brief hospital admission summary of why you were admitted to the hospital and post-hospital plan.  You were admitted because you were experiencing difficulty breathing and swollen legs.  And you were treated with IV medication to pee. You peed about 18.5L during your admission and was discharged once the swelling came down and breathing improved   Also you are found to be in A-fib with elevated heart rate and treated with IV amiodarone.  The following changes were made: We started you on Farxiga 10mg  daily  and switched your digoxin to amiodarone 200 mg daily.    POST-HOSPITAL & CARE INSTRUCTIONS Please let PCP/Specialists know of any changes in medications that were made.  Please see medications section of this packet for any medication changes.   DOCTOR'S APPOINTMENTS & FOLLOW UP Future Appointments  Date Time Provider [place]  12/18/2022  8:50 AM Javier Key, DO [facility]  12/29/2022 12:00 PM [place] None     Thank you for choosing [facility]! Take care and be well!  [facility]  [place], [place] [address] [phone]

## 2022-12-13 NOTE — Plan of Care (Signed)
Problem: Education: Goal: Knowledge of General Education information will improve Description: Including pain rating scale, medication(s)/side effects and non-pharmacologic comfort measures 12/13/2022 1429 by Mertha Finders, RN Outcome: Adequate for Discharge 12/13/2022 1415 by Mertha Finders, RN Outcome: Progressing   Problem: Health Behavior/Discharge Planning: Goal: Ability to manage health-related needs will improve 12/13/2022 1429 by Mertha Finders, RN Outcome: Adequate for Discharge 12/13/2022 1415 by Mertha Finders, RN Outcome: Progressing   Problem: Clinical Measurements: Goal: Ability to maintain clinical measurements within normal limits will improve 12/13/2022 1429 by Mertha Finders, RN Outcome: Adequate for Discharge 12/13/2022 1415 by Mertha Finders, RN Outcome: Progressing Goal: Will remain free from infection 12/13/2022 1429 by Mertha Finders, RN Outcome: Adequate for Discharge 12/13/2022 1415 by Mertha Finders, RN Outcome: Progressing Goal: Diagnostic test results will improve Outcome: Adequate for Discharge Goal: Respiratory complications will improve Outcome: Adequate for Discharge Goal: Cardiovascular complication will be avoided 12/13/2022 1429 by Mertha Finders, RN Outcome: Adequate for Discharge 12/13/2022 1415 by Mertha Finders, RN Outcome: Progressing   Problem: Activity: Goal: Risk for activity intolerance will decrease 12/13/2022 1429 by Mertha Finders, RN Outcome: Adequate for Discharge 12/13/2022 1415 by Mertha Finders, RN Outcome: Progressing   Problem: Nutrition: Goal: Adequate nutrition will be maintained 12/13/2022 1429 by Mertha Finders, RN Outcome: Adequate for Discharge 12/13/2022 1415 by Mertha Finders, RN Outcome: Progressing   Problem: Coping: Goal: Level of anxiety will decrease 12/13/2022 1429 by Mertha Finders, RN Outcome: Adequate for Discharge 12/13/2022 1415 by Mertha Finders, RN Outcome:  Progressing   Problem: Elimination: Goal: Will not experience complications related to bowel motility Outcome: Adequate for Discharge Goal: Will not experience complications related to urinary retention 12/13/2022 1429 by Mertha Finders, RN Outcome: Adequate for Discharge 12/13/2022 1415 by Mertha Finders, RN Outcome: Progressing   Problem: Pain Managment: Goal: General experience of comfort will improve 12/13/2022 1429 by Mertha Finders, RN Outcome: Adequate for Discharge 12/13/2022 1415 by Mertha Finders, RN Outcome: Progressing   Problem: Safety: Goal: Ability to remain free from injury will improve 12/13/2022 1429 by Mertha Finders, RN Outcome: Adequate for Discharge 12/13/2022 1415 by Mertha Finders, RN Outcome: Progressing   Problem: Skin Integrity: Goal: Risk for impaired skin integrity will decrease 12/13/2022 1429 by Mertha Finders, RN Outcome: Adequate for Discharge 12/13/2022 1415 by Mertha Finders, RN Outcome: Progressing   Problem: Education: Goal: Knowledge of General Education information will improve Description: Including pain rating scale, medication(s)/side effects and non-pharmacologic comfort measures 12/13/2022 1429 by Mertha Finders, RN Outcome: Adequate for Discharge 12/13/2022 1415 by Mertha Finders, RN Outcome: Progressing   Problem: Health Behavior/Discharge Planning: Goal: Ability to manage health-related needs will improve 12/13/2022 1429 by Mertha Finders, RN Outcome: Adequate for Discharge 12/13/2022 1415 by Mertha Finders, RN Outcome: Progressing   Problem: Clinical Measurements: Goal: Ability to maintain clinical measurements within normal limits will improve 12/13/2022 1429 by Mertha Finders, RN Outcome: Adequate for Discharge 12/13/2022 1415 by Mertha Finders, RN Outcome: Progressing Goal: Will remain free from infection 12/13/2022 1429 by Mertha Finders, RN Outcome: Adequate for Discharge 12/13/2022 1415 by  Mertha Finders, RN Outcome: Progressing Goal: Diagnostic test results will improve Outcome: Adequate for Discharge Goal: Respiratory complications will improve Outcome: Adequate for Discharge Goal: Cardiovascular complication will be avoided Outcome: Adequate for Discharge   Problem: Activity: Goal: Risk for activity intolerance will decrease Outcome: Adequate for Discharge   Problem: Elimination: Goal: Will not experience complications related to bowel motility Outcome: Adequate for Discharge Goal: Will not experience complications related to urinary retention Outcome: Adequate for Discharge  Problem: Pain Managment: Goal: General experience of comfort will improve Outcome: Adequate for Discharge   Problem: Safety: Goal: Ability to remain free from injury will improve Outcome: Adequate for Discharge   Problem: Skin Integrity: Goal: Risk for impaired skin integrity will decrease Outcome: Adequate for Discharge   Problem: Education: Goal: Ability to demonstrate management of disease process will improve Outcome: Adequate for Discharge Goal: Ability to verbalize understanding of medication therapies will improve Outcome: Adequate for Discharge Goal: Individualized Educational Video(s) Outcome: Adequate for Discharge   Problem: Activity: Goal: Capacity to carry out activities will improve Outcome: Adequate for Discharge   Problem: Cardiac: Goal: Ability to achieve and maintain adequate cardiopulmonary perfusion will improve Outcome: Adequate for Discharge

## 2022-12-13 NOTE — Plan of Care (Signed)
  Problem: Education: Goal: Knowledge of General Education information will improve Description: Including pain rating scale, medication(s)/side effects and non-pharmacologic comfort measures Outcome: Progressing   Problem: Health Behavior/Discharge Planning: Goal: Ability to manage health-related needs will improve Outcome: Progressing   Problem: Clinical Measurements: Goal: Ability to maintain clinical measurements within normal limits will improve Outcome: Progressing Goal: Will remain free from infection Outcome: Progressing Goal: Cardiovascular complication will be avoided Outcome: Progressing   Problem: Activity: Goal: Risk for activity intolerance will decrease Outcome: Progressing   Problem: Nutrition: Goal: Adequate nutrition will be maintained Outcome: Progressing   Problem: Coping: Goal: Level of anxiety will decrease Outcome: Progressing   Problem: Elimination: Goal: Will not experience complications related to urinary retention Outcome: Progressing   Problem: Pain Managment: Goal: General experience of comfort will improve Outcome: Progressing   Problem: Safety: Goal: Ability to remain free from injury will improve Outcome: Progressing   Problem: Skin Integrity: Goal: Risk for impaired skin integrity will decrease Outcome: Progressing   Problem: Education: Goal: Knowledge of General Education information will improve Description: Including pain rating scale, medication(s)/side effects and non-pharmacologic comfort measures Outcome: Progressing   Problem: Health Behavior/Discharge Planning: Goal: Ability to manage health-related needs will improve Outcome: Progressing   Problem: Clinical Measurements: Goal: Ability to maintain clinical measurements within normal limits will improve Outcome: Progressing Goal: Will remain free from infection Outcome: Progressing

## 2022-12-13 NOTE — Discharge Summary (Signed)
[facility] Discharge Summary  Patient name: Javier Berry Medical record number: [id number] Date of birth: 08/27/1961 Age: 62 y.o. Gender: male Date of Admission: 12/07/2022  Date of Discharge: 12/13/2022 Admitting Physician: Blane Ohara McDiarmid, MD  Primary Care Provider: Pcp, No Consultants: Cardiology  Indication for Hospitalization: Heart failure exacerbation and A-fib with RVR  Brief Hospital Course:  Javier Berry is a 62 y.o. male presenting with lower extremity edema and exertional dyspnea. Pertinent PMH/PSH includes HFrEF, A fib, HTN. CKD3, Gout, NICM, PUD.  His hospital course is outlined below:  Acute on chronic systolic congestive heart failure: Patient presented to the ED with several weeks of worsening shortness of breath, peripheral edema.  Historical echo showing LVEF 30-35%.  He was given 40 mg IV Lasix with minimal urine output cardiology was consulted and started him on 120 mg IV Lasix BID.  He had good urine output with peripheral edema resolving.  He diuresed total of 18.5 liters.  At discharge his weight was 88.2 kg.  He was discharged home with the following medications: Farxiga 10 mg daily. No indication to continue on Lasix outpatient at this time, will maintain with daily farxiga.  A-fib with RVR: At presentation patient was noted to be in A-fib with RVR with rates in the 120s.  Attempted digoxin load overnight as patient has been out of digoxin for over a month prior to admission.  Unfortunately rates were not controlled and cardiology was consulted.  They restarted his home metoprolol 100 mg twice daily and digoxin 0.125 mg daily.  He remained rate controlled for a few days but went back into A-fib with RVR on 3/13. He was loaded with amiodarone on 3/14 with good rate control. He was transitioned to PO on 3/15. At discharge he was  to  metoprolol reduced to 25mg  BID due to intermittent brady.   Shortness of breath: On initial  presentation patient was short of breath with dry cough.  Home medications of Breo inhaler and Atrovent were restarted with improvement.  Patient's respiratory status also improved with diuresis.  At discharge she was continued on his home inhalers.  PCP follow-up: PFTs outpatient to confirm diagnosis of presumed COPD. Discontinued Digioxin and started on Amiodarone 200mg  daily  Follow-up BMP outpatient to recheck potassium. Monitor BP Consider starting Entresto if appropriate with BP. Left knee pain, consider imaging.   Discharge Diagnoses/Problem List:  Acute on chronic systolic CHF (congestive heart failure) (HCC) Atrial fibrillation with RVR (HCC) Hypokalemia SOB (shortness of breath) on exertion CKD (chronic kidney disease) stage 3, GFR 30-59 ml/min (HCC)   Disposition: Home Health  Discharge Condition: Stable  Discharge Exam:  General: Alert, well appearing, NAD CV: RRR, no murmurs, normal S1/S2 Pulm: CTAB, good WOB on RA, no crackles or wheezing Abd: Soft, no distension, no tenderness Skin: dry, warm Ext: No BLE edema, +2 Pedal and radial pulse.   Significant Procedures: None  Significant Labs and Imaging:  No results for input(s): "WBC", "HGB", "HCT", "PLT" in the last 48 hours. Recent Labs  Lab 12/12/22 0041 12/13/22 0111  NA 136 134*  K 3.8 3.9  CL 99 98  CO2 28 27  GLUCOSE 113* 111*  BUN 22 20  CREATININE 1.68* 1.46*  CALCIUM 8.6* 8.5*  MG 2.1 2.0    DG CHEST PORT 1 VIEW  Result Date: 12/11/2022 CLINICAL DATA:  Fever cough and leg swelling EXAM: PORTABLE CHEST 1 VIEW COMPARISON:  12/07/2022, 04/02/2022 FINDINGS: Cardiomegaly. No acute airspace  disease or pleural effusion. No pneumothorax. IMPRESSION: No active disease. Cardiomegaly. Electronically Signed   By: Donavan Foil M.D.   On: 12/11/2022 00:32   VAS [name] LOWER EXTREMITY VENOUS (DVT)  Result Date: 12/08/2022  Lower Venous DVT Study Patient Name:  Javier Berry  Date of Exam:   12/08/2022 Medical  Rec #: [id number]       Accession #:    [id number] Date of Birth: [birth year]       Patient Gender: M Patient Age:   65 years Exam Location:  [facility] Procedure:      VAS [name] LOWER EXTREMITY VENOUS (DVT) Referring Phys: TODD MCDIARMID --------------------------------------------------------------------------------  Indications: Edema, right greater than left.  Risk Factors: Acute on chronic CHF. Anticoagulation: Eliquis. Comparison Study: No prior studies. Performing Technologist: Darlin Coco RDMS, RVT  Examination Guidelines: A complete evaluation includes B-mode imaging, spectral Doppler, color Doppler, and power Doppler as needed of all accessible portions of each vessel. Bilateral testing is considered an integral part of a complete examination. Limited examinations for reoccurring indications may be performed as noted. The reflux portion of the exam is performed with the patient in reverse Trendelenburg.  +---------+---------------+---------+-----------+----------+--------------+ RIGHT    CompressibilityPhasicitySpontaneityPropertiesThrombus Aging +---------+---------------+---------+-----------+----------+--------------+ CFV      Full           Yes      Yes                                 +---------+---------------+---------+-----------+----------+--------------+ SFJ      Full                                                        +---------+---------------+---------+-----------+----------+--------------+ FV Prox  Full                                                        +---------+---------------+---------+-----------+----------+--------------+ FV Mid   Full                                                        +---------+---------------+---------+-----------+----------+--------------+ FV DistalFull                                                        +---------+---------------+---------+-----------+----------+--------------+ PFV      Full                                                         +---------+---------------+---------+-----------+----------+--------------+ POP      Full           Yes      Yes                                 +---------+---------------+---------+-----------+----------+--------------+  PTV      Full                                                        +---------+---------------+---------+-----------+----------+--------------+ PERO     Full                                                        +---------+---------------+---------+-----------+----------+--------------+   +----+---------------+---------+-----------+----------+--------------+ LEFTCompressibilityPhasicitySpontaneityPropertiesThrombus Aging +----+---------------+---------+-----------+----------+--------------+ CFV Full           Yes      Yes                                 +----+---------------+---------+-----------+----------+--------------+     Summary: RIGHT: - There is no evidence of deep vein thrombosis in the lower extremity.  - No cystic structure found in the popliteal fossa.  LEFT: - No evidence of common femoral vein obstruction.  *See table(s) above for measurements and observations. Electronically signed by Orlie Pollen on 12/08/2022 at 6:03:16 PM.    Final    ECHOCARDIOGRAM COMPLETE  Result Date: 12/08/2022    ECHOCARDIOGRAM REPORT   Patient Name:   Javier Berry Date of Exam: 12/08/2022 Medical Rec #:  [id number]      Height:       72.0 in Accession #:    [id number]     Weight:       222.7 lb Date of Birth:  [birth year]      BSA:          2.230 m Patient Age:    75 years       BP:           123/73 mmHg Patient Gender: M              HR:           113 bpm. Exam Location:  Inpatient Procedure: 2D Echo, 3D Echo, Strain Analysis, Cardiac Doppler and Color Doppler Indications:    CHF-Acute Systolic [id number]  History:        Patient has prior history of Echocardiogram examinations, most                 recent 04/03/2022. CHF,  Arrythmias:Atrial Fibrillation,                 Signs/Symptoms:Shortness of Breath and Edema; Risk                 Factors:Hypertension.  Sonographer:    Wilkie Aye RVT RCS Referring Phys: [id number] [place]  1. There is severe global hypokinesis of the LV with relative sparing of the inferolateral wall. Left ventricular ejection fraction, by estimation, is 25 to 30%. The left ventricle has severely decreased function. The left ventricle demonstrates global hypokinesis. There is mild concentric left ventricular hypertrophy. Left ventricular diastolic function could not be evaluated.  2. Right ventricular systolic function is hyperdynamic. The right ventricular size is normal. There is mildly elevated pulmonary artery systolic pressure.  3. Left atrial size was moderately dilated.  4. Right atrial size was severely dilated.  5. The mitral valve  is normal in structure. No evidence of mitral valve regurgitation. No evidence of mitral stenosis.  6. Tricuspid valve regurgitation is moderate.  7. The aortic valve is normal in structure. Aortic valve regurgitation is mild. No aortic stenosis is present.  8. The inferior vena cava is dilated in size with <50% respiratory variability, suggesting right atrial pressure of 15 mmHg. FINDINGS  Left Ventricle: There is severe global hypokinesis of the LV with relative sparing of the inferolateral wall. Left ventricular ejection fraction, by estimation, is 25 to 30%. The left ventricle has severely decreased function. The left ventricle demonstrates global hypokinesis. The left ventricular internal cavity size was normal in size. There is mild concentric left ventricular hypertrophy. Left ventricular diastolic function could not be evaluated due to atrial fibrillation. Left ventricular diastolic function could not be evaluated. Right Ventricle: The right ventricular size is normal. No increase in right ventricular wall thickness. Right ventricular systolic function  is hyperdynamic. There is mildly elevated pulmonary artery systolic pressure. The tricuspid regurgitant velocity is 2.26 m/s, and with an assumed right atrial pressure of 15 mmHg, the estimated right ventricular systolic pressure is 99991111 mmHg. Left Atrium: Left atrial size was moderately dilated. Right Atrium: Right atrial size was severely dilated. Pericardium: Trivial pericardial effusion is present. The pericardial effusion is localized near the right atrium. Mitral Valve: The mitral valve is normal in structure. No evidence of mitral valve regurgitation. No evidence of mitral valve stenosis. Tricuspid Valve: The tricuspid valve is normal in structure. Tricuspid valve regurgitation is moderate . No evidence of tricuspid stenosis. Aortic Valve: The aortic valve is normal in structure. Aortic valve regurgitation is mild. No aortic stenosis is present. Aortic valve mean gradient measures 3.3 mmHg. Aortic valve peak gradient measures 6.3 mmHg. Aortic valve area, by VTI measures 3.72 cm. Pulmonic Valve: The pulmonic valve was normal in structure. Pulmonic valve regurgitation is not visualized. No evidence of pulmonic stenosis. Aorta: The aortic root is normal in size and structure. Venous: The inferior vena cava is dilated in size with less than 50% respiratory variability, suggesting right atrial pressure of 15 mmHg. IAS/Shunts: No atrial level shunt detected by color flow Doppler.  LEFT VENTRICLE PLAX 2D LVIDd:         5.60 cm LVIDs:         4.50 cm LV PW:         1.40 cm LV IVS:        0.90 cm LVOT diam:     2.10 cm   3D Volume EF: LV SV:         66        3D EF:        46 % LV SV Index:   30        LV EDV:       177 ml LVOT Area:     3.46 cm  LV ESV:       96 ml                          LV SV:        81 ml RIGHT VENTRICLE          IVC RV Basal diam:  4.50 cm  IVC diam: 2.00 cm RV Mid diam:    4.00 cm LEFT ATRIUM              Index        RIGHT ATRIUM  Index LA diam:        4.90 cm  2.20 cm/m   RA Area:      38.50 cm LA Vol (A2C):   119.0 ml 53.36 ml/m  RA Volume:   169.00 ml 75.78 ml/m LA Vol (A4C):   98.2 ml  44.04 ml/m LA Biplane Vol: 116.0 ml 52.02 ml/m  AORTIC VALVE                    PULMONIC VALVE AV Area (Vmax):    3.78 cm     PV Vmax:       1.05 m/s AV Area (Vmean):   3.69 cm     PV Peak grad:  4.4 mmHg AV Area (VTI):     3.72 cm AV Vmax:           125.67 cm/s AV Vmean:          83.200 cm/s AV VTI:            0.177 m AV Peak Grad:      6.3 mmHg AV Mean Grad:      3.3 mmHg LVOT Vmax:         137.00 cm/s LVOT Vmean:        88.567 cm/s LVOT VTI:          0.190 m LVOT/AV VTI ratio: 1.08  AORTA Ao Root diam: 2.70 cm Ao Asc diam:  3.30 cm MITRAL VALVE               TRICUSPID VALVE MV Area (PHT): 3.46 cm    TR Peak grad:   20.4 mmHg MV Decel Time: 220 msec    TR Vmax:        226.00 cm/s MV E velocity: 68.80 cm/s                            SHUNTS                            Systemic VTI:  0.19 m                            Systemic Diam: 2.10 cm Glori Bickers MD Electronically signed by Glori Bickers MD Signature Date/Time: 12/08/2022/1:08:08 PM    Final    DG Chest Portable 1 View  Result Date: 12/07/2022 CLINICAL DATA:  Dyspnea. EXAM: PORTABLE CHEST 1 VIEW COMPARISON:  04/02/2022 FINDINGS: The cardio pericardial silhouette is enlarged. The lungs are clear without focal pneumonia, edema, pneumothorax or pleural effusion. The visualized bony structures of the thorax are unremarkable. Telemetry leads overlie the chest. IMPRESSION: Enlargement of the cardiopericardial silhouette without acute cardiopulmonary findings. Electronically Signed   By: Misty Stanley M.D.   On: 12/07/2022 17:04     Results/Tests Pending at Time of Discharge: None  Discharge Medications:  Allergies as of 12/13/2022   No Known Allergies      Medication List     STOP taking these medications    Bufferin Extra Strength 500 MG Tabs Generic drug: Aspirin Buf(CaCarb-MgCarb-MgO)   digoxin 0.125 MG tablet Commonly  known as: LANOXIN   furosemide 40 MG tablet Commonly known as: LASIX       TAKE these medications    allopurinol 100 MG tablet Commonly known as: ZYLOPRIM Take 1 tablet (100 mg total) by mouth daily. What changed:  when  to take this reasons to take this   amiodarone 200 MG tablet Commonly known as: PACERONE Take 1 tablet (200 mg total) by mouth daily.   dapagliflozin propanediol 10 MG Tabs tablet Commonly known as: FARXIGA Take 1 tablet (10 mg total) by mouth daily.   Eliquis 5 MG Tabs tablet Generic drug: apixaban Take 1 tablet (5 mg total) by mouth 2 (two) times daily. TAKE 1 TABLET BY MOUTH 2 TIMES DAILY pt needs appointment and labs What changed: additional instructions   fluticasone furoate-vilanterol 100-25 MCG/ACT Aepb Commonly known as: Breo Ellipta Inhale 1 puff into the lungs daily as needed (shortness of breath).   metoprolol succinate 50 MG 24 hr tablet Commonly known as: TOPROL-XL Take 1 tablet (50 mg total) by mouth daily. Take with or immediately following a meal. What changed:  medication strength how much to take when to take this additional instructions Another medication with the same name was removed. Continue taking this medication, and follow the directions you see here.   Mucinex 600 MG 12 hr tablet Generic drug: guaiFENesin Take 600-1,200 mg by mouth 2 (two) times daily as needed for to loosen phlegm or cough.   spironolactone 25 MG tablet Commonly known as: ALDACTONE Take 1 tablet (25 mg total) by mouth daily.   Ventolin HFA 108 (90 Base) MCG/ACT inhaler Generic drug: albuterol Inhale 1-2 puffs into the lungs every 6 (six) hours as needed for wheezing or shortness of breath.               Durable Medical Equipment  (From admission, onward)           Start     Ordered   12/12/22 1705  For home use only DME Walker rolling  Once       Question Answer Comment  Walker: With [place] Wheels   Patient needs a walker to treat  with the following condition Weakness      12/12/22 1705            Discharge Instructions: Please refer to Patient Instructions section of EMR for full details.  Patient was counseled important signs and symptoms that should prompt return to medical care, changes in medications, dietary instructions, activity restrictions, and follow up appointments.   Follow-Up Appointments:  Follow-up Information     [facility] Follow up in 17 day(s).   Specialty: Cardiology Why: Hospital follow up 12/29/2022 @ 12 noon PLEASE bring a current medication list to appointment FREE valet parking, Entrance C, off [address] [employer] information: [address] [id number] [place], [facility] Follow up.   Specialty: Home Health Services Why: Agency will call you to set up apt times Contact information: [place] [address] [phone]         [facility] Follow up.   Why: rolling walker will be brought up to room Contact information: [address] [place] [address] [phone]         [facility], Pandora, DO Follow up.   Specialty: Family Medicine Why: 3/21 @ 8:50 AM. PLease arrive 15 minutes early to your appointment Contact information: [place] [address] [phone]                 Alen Bleacher, MD 12/13/2022, 4:24 PM PGY-2, [place]

## 2022-12-13 NOTE — Progress Notes (Signed)
Pt discharged to home. At time of discharge, pt A&O x 4 and VSS. No complaints of pain or discomfort at this time. AVS discharge instructions provided to pt by SWOT RN. Pt verbalized understanding of all discharge instructions. All PIVs removed and all pt belongings taken with pt---pt verified. Telemetry discontinued and notified.

## 2022-12-14 ENCOUNTER — Telehealth: Payer: Self-pay | Admitting: Family Medicine

## 2022-12-14 DIAGNOSIS — I5023 Acute on chronic systolic (congestive) heart failure: Secondary | ICD-10-CM

## 2022-12-14 MED ORDER — METOPROLOL SUCCINATE ER 50 MG PO TB24: 50.0000 mg | ORAL_TABLET | Freq: Every day | ORAL | 0 refills | Status: DC

## 2022-12-14 MED ORDER — DAPAGLIFLOZIN PROPANEDIOL 10 MG PO TABS: 10.0000 mg | ORAL_TABLET | Freq: Every day | ORAL | 0 refills | Status: DC

## 2022-12-14 MED ORDER — SPIRONOLACTONE 25 MG PO TABS: 25.0000 mg | ORAL_TABLET | Freq: Every day | ORAL | 0 refills | Status: DC

## 2022-12-14 MED ORDER — AMIODARONE HCL 200 MG PO TABS: 200.0000 mg | ORAL_TABLET | Freq: Every day | ORAL | 0 refills | Status: DC

## 2022-12-14 NOTE — Telephone Encounter (Signed)
Called patient confirmed of speaking with Mr. Javier Berry.  He notes he is feeling well since leaving the hospital.  He denies any shortness of breath, fevers, chills, generalized weakness.  He notes he was not able to pick up his home medications because the [facility] is closed, but we confirmed he can go to [facility] on [address] and that is it open till 10 PM today so I have sent those prescriptions to the pharmacy.  I discussed that one of his blood cultures came back growing bacteria but is possibly a contaminant, and as long as he is feeling well I think we can continue to monitor as this was an only 1 culture.  He denies any fevers or chills or weakness at home. Blood cultures had been drawn in hospital bc he had one isolated fever that self-resolved. I discussed that if he starts feeling bad including fevers, chills, weakness,  he needs to come straight back to the hospital.  He is in agreement with this.  I will monitor the lab for susceptibilities.  Called and spoke with lab who notes speciation will be out later today.  Will follow closely.  Yehuda Savannah MD

## 2022-12-15 ENCOUNTER — Telehealth: Payer: Self-pay

## 2022-12-15 NOTE — Telephone Encounter (Signed)
Patients wife calls nurse line requesting (2) prescriptions.   She reports he was discharged from the hospital over the weekend and several prescriptions were "renewed."   She reports he is in need of Allopurinol and Ventolin. She reports these were "supposed" to be called in as well.   Patient has a new patient apt with [facility] on 3/21.   Will send request to inpatient provider.

## 2022-12-16 MED ORDER — ALLOPURINOL 100 MG PO TABS: 100.0000 mg | ORAL_TABLET | Freq: Every day | ORAL | 2 refills | Status: DC

## 2022-12-16 MED ORDER — VENTOLIN HFA 108 (90 BASE) MCG/ACT IN AERS: 1.0000 | INHALATION_SPRAY | Freq: Four times a day (QID) | RESPIRATORY_TRACT | 3 refills | Status: AC | PRN

## 2022-12-16 NOTE — Telephone Encounter (Signed)
Called patient, left VM calling to check on him. 1/4 blood cultures growing Staph capitis, likely contaminant as previously discussed with him. Reminded of appt on 12/18/22.  Called patient's wife, left VM refills sent to pharmacy and reminding of appt on 12/18/22.  Yehuda Savannah MD

## 2022-12-16 NOTE — Telephone Encounter (Signed)
See phone note,called and left VM to check on him. Refills sent to pharmacy as requested.

## 2022-12-17 LAB — CULTURE, BLOOD (ROUTINE X 2)
Culture: NO GROWTH
Special Requests: ADEQUATE
Special Requests: ADEQUATE

## 2022-12-18 ENCOUNTER — Ambulatory Visit: Payer: BC Managed Care – PPO | Admitting: Family Medicine

## 2022-12-18 ENCOUNTER — Encounter: Payer: Self-pay | Admitting: Family Medicine

## 2022-12-18 VITALS — BP 124/80 | HR 84 | Temp 98.5°F | Ht 72.0 in | Wt 201.0 lb

## 2022-12-18 DIAGNOSIS — I420 Dilated cardiomyopathy: Secondary | ICD-10-CM

## 2022-12-18 DIAGNOSIS — Z1159 Encounter for screening for other viral diseases: Secondary | ICD-10-CM | POA: Diagnosis not present

## 2022-12-18 DIAGNOSIS — I4891 Unspecified atrial fibrillation: Secondary | ICD-10-CM | POA: Diagnosis not present

## 2022-12-18 MED ORDER — FUROSEMIDE 40 MG PO TABS: 40.0000 mg | ORAL_TABLET | Freq: Every day | ORAL | 0 refills | Status: DC

## 2022-12-18 NOTE — Assessment & Plan Note (Addendum)
In the hospital he had A-fib with RVR, cardiology was following.  He was loaded with amiodarone, ultimately transitioned to 200 mg daily which he is currently taking in addition to metoprolol 50mg  daily. Today on exam regular rate and rhythm.  Will continue to monitor and he will be seen at his cardiologist appointment and I will see him at follow up in 3 weeks.

## 2022-12-18 NOTE — Assessment & Plan Note (Signed)
Echo in the hospital with EF of 25 to 30%, LV global hypokinesis, mild concentric LV hypertrophy, mildly elevated pulmonary artery systolic, moderate tricuspid valve regurg.  He was diuresed with IV Lasix in the hospital but was not discharged with any.  He was started on Farxiga 10mg  daily, spironolactone 25mg  daily.  On exam today breathing comfortably, but does have 1+ pitting edema to mid shin bilaterally. BP normotensive. Discussed taking Lasix 40 mg on an as-needed basis if his weight is more than 2 pounds in 1 day, or 5 pounds in a week.  Advised him to follow-up with his cardiologist at scheduled appointment on 4/1, and I will check in with him after his appointment in about 3 weeks. -Lasix 40 mg as needed - BMP

## 2022-12-18 NOTE — Patient Instructions (Signed)
It was great seeing you today!  You came in to follow-up from your hospitalization, and I am glad to see you are doing well!  Today we will check blood work and I will call you if anything is abnormal.  Continue taking medications as discussed.  I would like you to take Lasix 40 mg when your weight is up greater than 2 pounds in 1 day or greater than 5 pounds in a week.  Please check-out at the front desk before leaving the clinic. I'd like to see you back in about 3 weeks after you've seen cardiology, but if you need to be seen earlier than that for any new issues we're happy to fit you in, just give [name] a call!  Feel free to call with any questions or concerns at any time, at [phone].   Take care,  Dr. Shary Key [facility]

## 2022-12-18 NOTE — Progress Notes (Signed)
SUBJECTIVE:   CHIEF COMPLAINT / HPI:   Javier Berry resents with his wife for hospital follow-up.  Was hospitalized from 3/22 to 3/16. His hospital course is outlined below:   Acute on chronic systolic congestive heart failure: Patient presented to the ED with several weeks of worsening shortness of breath, peripheral edema.  Historical echo showing LVEF 30-35%.  He was given 40 mg IV Lasix with minimal urine output cardiology was consulted and started him on 120 mg IV Lasix BID.  He had good urine output with peripheral edema resolving.  He diuresed total of 18.5 liters.  At discharge his weight was 88.2 kg.  He was discharged home with the following medications: Farxiga 10 mg daily. No indication to continue on Lasix outpatient at this time, will maintain with daily farxiga.   A-fib with RVR: At presentation patient was noted to be in A-fib with RVR with rates in the 120s.  Attempted digoxin load overnight as patient has been out of digoxin for over a month prior to admission.  Unfortunately rates were not controlled and cardiology was consulted.  They restarted his home metoprolol 100 mg twice daily and digoxin 0.125 mg daily.  He remained rate controlled for a few days but went back into A-fib with RVR on 3/13. He was loaded with amiodarone on 3/14 with good rate control. He was transitioned to PO on 3/15. At discharge he was  to  metoprolol reduced to 25mg  BID due to intermittent brady.     Today he endorses feeling very well.  Denies any chest pain, shortness of breath. We reviewed his medication changes since in the hospital which she is taking.  He and his wife noted that they do not have refills on them, but able to fill them as his PCP.  His cardiology appointment is scheduled for 4/1.  He did have a positive culture and came back in one of 4 tubes. Patient denies any fevers endorsing feeling well since he been home from the hospital.  He does endorse intermittent chills that he states  he has sometimes has when his gout or knee pain flares up. Denies current knee pain.    PERTINENT  PMH / PSH: Reviewed   OBJECTIVE:   BP 124/80   Pulse 84   Temp 98.5 F (36.9 C)   Ht 6' (1.829 m)   Wt 201 lb (91.2 kg)   SpO2 95%   BMI 27.26 kg/m    Physical exam General: well appearing, NAD Cardiovascular: RRR, no murmurs Lungs: CTAB. Normal WOB Abdomen: soft, non-distended, non-tender Skin: warm, dry. 1+ pitting edema to mid shin bilaterally.   ASSESSMENT/PLAN:   Congestive dilated cardiomyopathy (HCC) Echo in the hospital with EF of 25 to 30%, LV global hypokinesis, mild concentric LV hypertrophy, mildly elevated pulmonary artery systolic, moderate tricuspid valve regurg.  He was diuresed with IV Lasix in the hospital but was not discharged with any.  He was started on Farxiga 10mg  daily, spironolactone 25mg  daily.  On exam today breathing comfortably, but does have 1+ pitting edema to mid shin bilaterally. BP normotensive. Discussed taking Lasix 40 mg on an as-needed basis if his weight is more than 2 pounds in 1 day, or 5 pounds in a week.  Advised him to follow-up with his cardiologist at scheduled appointment on 4/1, and I will check in with him after his appointment in about 3 weeks. -Lasix 40 mg as needed - BMP  Atrial fibrillation with RVR ([place]) In  the hospital he had A-fib with RVR, cardiology was following.  He was loaded with amiodarone, ultimately transitioned to 200 mg daily which he is currently taking in addition to metoprolol 50mg  daily. Today on exam regular rate and rhythm.  Will continue to monitor and he will be seen at his cardiologist appointment and I will see him at follow up in 3 weeks.  Positive blood culture He did have a positive blood culture that detected staph in one of 4 tubes which is likely a contaminant and discussed this with patient. Afebrile today. He denies any fevers endorsing feeling well since he been home from the hospital. Does have  intermittent chills when he is in pain at baseline no increase in frequency so do not suspect that is related.  Will not check blood cultures at this time, but discussed calling if he does develop symptoms.  Javier Berry

## 2022-12-19 ENCOUNTER — Telehealth: Payer: Self-pay

## 2022-12-19 ENCOUNTER — Other Ambulatory Visit (HOSPITAL_COMMUNITY): Payer: Self-pay

## 2022-12-19 LAB — BASIC METABOLIC PANEL
BUN/Creatinine Ratio: 14 (ref 10–24)
BUN: 19 mg/dL (ref 8–27)
CO2: 23 mmol/L (ref 20–29)
Calcium: 9.6 mg/dL (ref 8.6–10.2)
Chloride: 104 mmol/L (ref 96–106)
Creatinine, Ser: 1.35 mg/dL — ABNORMAL HIGH (ref 0.76–1.27)
Glucose: 97 mg/dL (ref 70–99)
Potassium: 5.2 mmol/L (ref 3.5–5.2)
Sodium: 141 mmol/L (ref 134–144)
eGFR: 59 mL/min/{1.73_m2} — ABNORMAL LOW (ref >59)

## 2022-12-19 LAB — HCV AB W REFLEX TO QUANT PCR: HCV Ab: NONREACTIVE

## 2022-12-19 LAB — HCV INTERPRETATION

## 2022-12-19 NOTE — Telephone Encounter (Signed)
Rec'd PA request for Ventolin HFA inhaler.  Generic Ventolin HFA (albuterol 108 (90 base) MCG/ACT inhaler) covered by patients insurance. Spoke with pharmacy. Pharmacy says rx was sent with a DAW1. Authorized that it is ok to dispense generic since it wasn't specified on rx on patients chart.

## 2022-12-22 ENCOUNTER — Telehealth (HOSPITAL_COMMUNITY): Payer: Self-pay | Admitting: Adult Health

## 2022-12-22 ENCOUNTER — Encounter: Payer: Self-pay | Admitting: Family Medicine

## 2022-12-23 ENCOUNTER — Telehealth: Payer: Self-pay

## 2022-12-23 ENCOUNTER — Telehealth: Payer: Self-pay | Admitting: Family Medicine

## 2022-12-23 ENCOUNTER — Other Ambulatory Visit: Payer: Self-pay | Admitting: Family Medicine

## 2022-12-23 NOTE — Telephone Encounter (Signed)
Wife calls nurse line again in regards to pain control.   Patient scheduled for tomorrow for evaluation.

## 2022-12-23 NOTE — Telephone Encounter (Signed)
Wife returns call to nurse line regarding pain medication request.   She wanted me to let provider know that patient does not tolerate tramadol well. This causes him to have dizziness and headaches.   Please advise.   Talbot Grumbling, RN

## 2022-12-23 NOTE — Telephone Encounter (Signed)
Patients wife calls nurse line requesting a prescription for pain management.   She reports he has been taking Tylenol, however reports minimal relief.   She reports constant pain in his legs and feet.   I advised her if this was not discussed at recent office visit they may need to come in for an additional apt.   Will forward to PCP.

## 2022-12-23 NOTE — Telephone Encounter (Signed)
Patient returns call to nurse line regarding concern.   Talbot Grumbling, RN

## 2022-12-24 ENCOUNTER — Ambulatory Visit
Admission: RE | Admit: 2022-12-24 | Discharge: 2022-12-24 | Disposition: A | Discharge status: Home / Self Care | Payer: BC Managed Care – PPO | Source: Ambulatory Visit | Attending: Family Medicine | Admitting: Family Medicine

## 2022-12-24 ENCOUNTER — Ambulatory Visit: Payer: BC Managed Care – PPO | Admitting: Student

## 2022-12-24 VITALS — BP 138/78 | HR 96 | Ht 72.0 in | Wt 203.0 lb

## 2022-12-24 DIAGNOSIS — G8929 Other chronic pain: Secondary | ICD-10-CM | POA: Diagnosis not present

## 2022-12-24 DIAGNOSIS — M25561 Pain in right knee: Secondary | ICD-10-CM | POA: Diagnosis not present

## 2022-12-24 DIAGNOSIS — M25562 Pain in left knee: Secondary | ICD-10-CM

## 2022-12-24 DIAGNOSIS — M109 Gout, unspecified: Secondary | ICD-10-CM | POA: Diagnosis not present

## 2022-12-24 DIAGNOSIS — M10472 Other secondary gout, left ankle and foot: Secondary | ICD-10-CM

## 2022-12-24 MED ORDER — PREDNISONE 10 MG PO TABS: 30.0000 mg | ORAL_TABLET | Freq: Every day | ORAL | 0 refills | Status: AC

## 2022-12-24 MED ORDER — OXYCODONE HCL 5 MG PO CAPS: 5.0000 mg | ORAL_CAPSULE | Freq: Four times a day (QID) | ORAL | 0 refills | Status: DC | PRN

## 2022-12-24 NOTE — Patient Instructions (Addendum)
It was great to see you today! Thank you for choosing [facility] for your primary care. Javier Berry was seen for follow up.  Today we addressed: Oxycodone 5 mg every 6 hours as needed for pain control  It can be sedating  Prednisone 30 mg daily until resolution of symptoms, then take for 3 more days afterwards  You can get your Xrays at [place]   If you haven't already, sign up for My Chart to have easy access to your labs results, and communication with your primary care physician.  I recommend that you always bring your medications to each appointment as this makes it easy to ensure you are on the correct medications and helps [name] not miss refills when you need them. Call the clinic at [phone] if your symptoms worsen or you have any concerns.  You should return to our clinic Return in about 2 weeks (around 01/07/2023), or with Dr. Arby Barrette. Please arrive 15 minutes before your appointment to ensure smooth check in process.  We appreciate your efforts in making this happen.  Thank you for allowing me to participate in your care, Javier Emery, MD 12/24/2022, 10:25 AM PGY-2, [place]

## 2022-12-24 NOTE — Assessment & Plan Note (Signed)
Patient reports chronic pain that is acutely worsened after increase in volume status of the lower extremities.  It does appear related to osteoarthritis given presentation.  He does not appear to have any further gout attacks affecting other joints. Assessed patient with Dr. McDiarmid and decided best to continue with XR for imaging of the knee in addition to oxycodone for breakthrough pain. We are limited on what types of medications are available to him given comorbidities. Patient verbalized understanding to return in 2 weeks to see Dr. Arby Barrette and recheck uric acid level.

## 2022-12-24 NOTE — Assessment & Plan Note (Signed)
Acute gout diagnosis rule of 9 points making gout very likely.  Continue with allopurinol daily.  Unable to provide colchicine given interaction with amiodarone that he is currently on.  Will continue with prednisone 30 mg daily until symptoms have resolved and then continue with prednisone for 3 days afterwards.  Patient verbalizes understanding.  If symptoms do not resolve or improve over the next couple of days he was instructed to call us.

## 2022-12-24 NOTE — Progress Notes (Signed)
SUBJECTIVE:   CHIEF COMPLAINT / HPI:   Pain in the legs and feet  Multiple calls to the nursing line yesterday for this pain, reported a bad reaction to tramadol for pain relief, has dizziness with this medication. Recently admitted to the hospital for acute on chronic HF and Afib with RVR on amiodarone.   Bilateral Knee Pain - Present for years but worsened after the most recent exacerbation of his heart failure. - He reports that he has pain more significantly in the right knee but also on the left, reports it has more global pain. - He is ambulating with a crutch. - Notes that the swelling in his legs after he discontinued his Lasix before hospital admission made the knee pain much more painful. - He is unable to go up steps secondary to the pain - Denies systemic symptoms, fever, chills   Left Great Toe Pain: - Patient has a history of gout that is frequently in the left great toe - He reports swelling in that toe recently, pain is related to his previous gout episodes - Patient notes that he takes his allopurinol daily - The swelling and pain in the toes started a couple of days ago. - Patient denies fever or chills - No other areas of swelling or warmth.   PERTINENT  PMH / PSH:  Chronic systolic HF Afib with RVR Congestive dilated cardiomyopathy  HTN CKD  Chronic AC H/o SBO   Patient Care Team: Shary Key, DO as PCP - General (Family Medicine) Lelon Perla, MD as PCP - Cardiology (Cardiology) OBJECTIVE:  BP 138/78   Pulse 96   Ht 6' (1.829 m)   Wt 203 lb (92.1 kg)   SpO2 100%   BMI 27.53 kg/m  Physical Exam  General: Alert and oriented in no apparent distress Heart: Regular rate and rhythm with no murmurs appreciated Lungs: CTA bilaterally, no wheezing Abdomen: No abdominal pain Skin: Warm and dry Extremities: 2+ pitting edema to the knees bilaterally, palpable dorsalis pedis pulses. Tenderness to palpation over the medial aspect of the knee  bilaterally.  Full flexion and extension well-seated of the left knee with significant crepitus Tenderness to palpation over the medial aspect of the right knee with limited extension actively, able to passively extend to 180 degrees with significant crepitus No ligamentous laxity Ambulates with a crutch Left great toe: Swelling and tenderness over the distal metatarsal of the left great toe Notable change in skin color, and slight warmth  ASSESSMENT/PLAN:  Acute gout due to other secondary cause involving toe of left foot Assessment & Plan: Acute gout diagnosis rule of 9 points making gout very likely.  Continue with allopurinol daily.  Unable to provide colchicine given interaction with amiodarone that he is currently on.  Will continue with prednisone 30 mg daily until symptoms have resolved and then continue with prednisone for 3 days afterwards.  Patient verbalizes understanding.  If symptoms do not resolve or improve over the next couple of days he was instructed to call us.  Orders: -     oxyCODONE HCl; Take 1 capsule (5 mg total) by mouth every 6 (six) hours as needed for pain.  Dispense: 15 capsule; Refill: 0 -     predniSONE; Take 3 tablets (30 mg total) by mouth daily for 7 days. Take until pain and symptoms resolve and then continue for 3 more days  Dispense: 21 tablet; Refill: 0  Chronic pain of both knees Assessment & Plan: Patient reports chronic  pain that is acutely worsened after increase in volume status of the lower extremities.  It does appear related to osteoarthritis given presentation.  He does not appear to have any further gout attacks affecting other joints. Assessed patient with Dr. McDiarmid and decided best to continue with XR for imaging of the knee in addition to oxycodone for breakthrough pain. We are limited on what types of medications are available to him given comorbidities. Patient verbalized understanding to return in 2 weeks to see Dr. Arby Barrette and recheck uric  acid level.   Orders: -     oxyCODONE HCl; Take 1 capsule (5 mg total) by mouth every 6 (six) hours as needed for pain.  Dispense: 15 capsule; Refill: 0 -     DG Knee Complete 4 Views Left; Future -     DG Knee Complete 4 Views Right; Future   Return in about 2 weeks (around 01/07/2023), or with Dr. Arby Barrette. [name], MD 12/24/2022, 11:08 AM PGY-2, [place]

## 2022-12-26 ENCOUNTER — Encounter (HOSPITAL_COMMUNITY): Payer: BC Managed Care – PPO

## 2022-12-29 ENCOUNTER — Other Ambulatory Visit: Payer: Self-pay

## 2022-12-29 ENCOUNTER — Encounter (HOSPITAL_COMMUNITY): Payer: BC Managed Care – PPO

## 2022-12-29 ENCOUNTER — Encounter: Payer: Self-pay | Admitting: Student

## 2022-12-29 DIAGNOSIS — G8929 Other chronic pain: Secondary | ICD-10-CM

## 2022-12-29 DIAGNOSIS — M10472 Other secondary gout, left ankle and foot: Secondary | ICD-10-CM

## 2022-12-29 NOTE — Telephone Encounter (Signed)
Error

## 2022-12-30 ENCOUNTER — Other Ambulatory Visit: Payer: Self-pay | Admitting: Student

## 2022-12-30 ENCOUNTER — Telehealth: Payer: Self-pay

## 2022-12-30 DIAGNOSIS — M170 Bilateral primary osteoarthritis of knee: Secondary | ICD-10-CM

## 2022-12-30 NOTE — Telephone Encounter (Signed)
Patient wife calls nurse line requesting imaging results.   Results discussed with patients wife.   She reports they would like to move forward with a PT referral.   Will send to provider who saw patient.

## 2022-12-31 NOTE — Telephone Encounter (Signed)
Patient's wife calls nurse line regarding pain medicine refill.   Advised that this was denied. She reports that patient is continuing to have knee pain.   He has an appointment on 01/08/23.  Please advise how patient should proceed until this appointment.   Talbot Grumbling, RN

## 2023-01-02 ENCOUNTER — Other Ambulatory Visit: Payer: Self-pay | Admitting: Family Medicine

## 2023-01-02 DIAGNOSIS — G8929 Other chronic pain: Secondary | ICD-10-CM

## 2023-01-02 MED ORDER — DICLOFENAC SODIUM 1 % EX GEL: 4.0000 g | Freq: Four times a day (QID) | CUTANEOUS | 2 refills | Status: DC

## 2023-01-02 NOTE — Telephone Encounter (Signed)
Patient's wife returns call to nurse line regarding pain medication refill. She states that patient has already been taking Tylenol Extra strength with no relief.   She is asking for returned call from provider regarding how they should manage pain until next appointment.   Please return call to [phone].  Veronda Prude, RN

## 2023-01-02 NOTE — Progress Notes (Signed)
Returned wife's call. Discussed that I wont be able to prescribe opioids for long term pain management of his arthritis. Discussed with Dr. Manson Passey. Will place Ortho referral for further evaluation and treatment. May benefit from injections, etc. Wife expressed understanding

## 2023-01-07 ENCOUNTER — Encounter (HOSPITAL_COMMUNITY): Payer: Self-pay

## 2023-01-07 ENCOUNTER — Ambulatory Visit (HOSPITAL_COMMUNITY)
Admission: RE | Admit: 2023-01-07 | Discharge: 2023-01-07 | Disposition: A | Discharge status: Home / Self Care | Payer: BC Managed Care – PPO | Source: Ambulatory Visit | Attending: Cardiology | Admitting: Cardiology

## 2023-01-07 VITALS — BP 148/92 | HR 113 | Wt 205.0 lb

## 2023-01-07 DIAGNOSIS — I5023 Acute on chronic systolic (congestive) heart failure: Secondary | ICD-10-CM | POA: Diagnosis not present

## 2023-01-07 DIAGNOSIS — I082 Rheumatic disorders of both aortic and tricuspid valves: Secondary | ICD-10-CM | POA: Diagnosis not present

## 2023-01-07 DIAGNOSIS — I4821 Permanent atrial fibrillation: Secondary | ICD-10-CM | POA: Insufficient documentation

## 2023-01-07 DIAGNOSIS — Z79899 Other long term (current) drug therapy: Secondary | ICD-10-CM | POA: Diagnosis not present

## 2023-01-07 DIAGNOSIS — I4891 Unspecified atrial fibrillation: Secondary | ICD-10-CM

## 2023-01-07 DIAGNOSIS — Z7901 Long term (current) use of anticoagulants: Secondary | ICD-10-CM | POA: Diagnosis not present

## 2023-01-07 DIAGNOSIS — I130 Hypertensive heart and chronic kidney disease with heart failure and stage 1 through stage 4 chronic kidney disease, or unspecified chronic kidney disease: Secondary | ICD-10-CM | POA: Insufficient documentation

## 2023-01-07 DIAGNOSIS — I5022 Chronic systolic (congestive) heart failure: Secondary | ICD-10-CM | POA: Insufficient documentation

## 2023-01-07 DIAGNOSIS — N1831 Chronic kidney disease, stage 3a: Secondary | ICD-10-CM | POA: Insufficient documentation

## 2023-01-07 DIAGNOSIS — Z8249 Family history of ischemic heart disease and other diseases of the circulatory system: Secondary | ICD-10-CM | POA: Insufficient documentation

## 2023-01-07 LAB — BASIC METABOLIC PANEL
Anion gap: 7 (ref 5–15)
BUN: 15 mg/dL (ref 8–23)
CO2: 23 mmol/L (ref 22–32)
Calcium: 8.8 mg/dL — ABNORMAL LOW (ref 8.9–10.3)
Chloride: 109 mmol/L (ref 98–111)
Creatinine, Ser: 1.45 mg/dL — ABNORMAL HIGH (ref 0.61–1.24)
GFR, Estimated: 54 mL/min — ABNORMAL LOW (ref >60)
Glucose, Bld: 106 mg/dL — ABNORMAL HIGH (ref 70–99)
Potassium: 3.9 mmol/L (ref 3.5–5.1)
Sodium: 139 mmol/L (ref 135–145)

## 2023-01-07 LAB — BRAIN NATRIURETIC PEPTIDE: B Natriuretic Peptide: 241 pg/mL — ABNORMAL HIGH (ref 0.0–100.0)

## 2023-01-07 MED ORDER — ENTRESTO 24-26 MG PO TABS: 1.0000 | ORAL_TABLET | Freq: Two times a day (BID) | ORAL | 3 refills | Status: DC

## 2023-01-07 MED ORDER — AMIODARONE HCL 200 MG PO TABS
200.0000 mg | ORAL_TABLET | Freq: Two times a day (BID) | ORAL | 0 refills | Status: DC
Start: 2023-01-07 — End: 2023-02-10

## 2023-01-07 MED ORDER — DIGOXIN 125 MCG PO TABS: 0.1250 mg | ORAL_TABLET | Freq: Every day | ORAL | 3 refills | Status: DC

## 2023-01-07 NOTE — Progress Notes (Addendum)
HEART & VASCULAR TRANSITION OF CARE CONSULT NOTE     Referring Physician: Dr. Anne Fu  Primary Care: Cora Collum, DO Primary Cardiologist: [name], MD  HPI: Referred to clinic by Dr. Anne Fu for heart failure consultation.   62 y/o AAM w/ h/o chronic systolic heart failure/ presumed NICM, permanent atrial fibrillation, HTN and CKDIIIa (b/l SCr ~1.3).   Systolic heart failure diagnosed in 2016 when EF dropped on 30-35% (prior echo in 2015 was normal). Myoview in May 2016 showed moderate LV dysfunction, normal perfusion. Echocardiogram in February 2018 showed EF 30 to 35%, mild MR, severe biatrial enlargement and moderate TR. In July 2020, heart monitor showed rate controlled permanent afib. Repeat Echocardiogram in July 2020 showed EF 55 to 60%, RVSP 26.5 mmHg, moderate left atrial enlargement, severe right atrial enlargement. Admitted July 2023 with acute heart failure exacerbation in the setting of atrial fibrillation with RVR. Symptoms were going on for 3 months and he was not taking any metoprolol. Echocardiogram showed drop in EF, back down to 30 to 35%, severe biatrial enlargement, trivial MR, mild-moderate TR. He was diuresed and placed back on meds. Readmitted again 3/24 for a/c CHF and rapid afib. Had ran out of meds, including digoxin. Echo showed further drop in EF, down to 25-30%, mod LAE, severe RAE w/ hyperdynamic RV, mildly elevated RVSP and mod TR, IVC dilated. He was diuresed w/ IV Lasix. Placed on amiodarone for rate control of Afib. After diuresis, was transitioned PO Lasix 40 mg daily + GDMT w/ Farxiga and spiro. Was not discharged on loop diuretic. Referred to [facility] clinic. D/c wt 194 lb.   He presents today for f/u. Reports improvement of symptoms. Denies resting dyspnea. No orthopnea/PND. No dyspnea walking on flat surfaces but SOB ambulating stairs. Denies CP. No LEE. Compliant w/ medications. His wife has been helping him and he now has meds organized in a pill  box. Has not yet taken AM meds today but will take when he returns home. BP 148/92. Denies orthostatic symptoms. EKG shows Afib w/ RVR 123 bmp, QT 338 ms. Not symptomatic w/ palpitations. Reports full compliance w/ Eliquis. No abnormal bleeding. Denies h/o snoring. Denies ETOH use. No tobacco use.   His wt is up 9 lb based off clinic scale, though he reports wts have been stable at home. ReDs 39%.   He reports his father had CHF and his brother was told he had an "enlarged heart".   He is a foster parent. He and his wife have raised 37 children.     Cardiac Testing  1. There is severe global hypokinesis of the LV with relative sparing of  the inferolateral wall. Left ventricular ejection fraction, by estimation,  is 25 to 30%. The left ventricle has severely decreased function. The left  ventricle demonstrates global  hypokinesis. There is mild concentric left ventricular hypertrophy. Left  ventricular diastolic function could not be evaluated.   2. Right ventricular systolic function is hyperdynamic. The right  ventricular size is normal. There is mildly elevated pulmonary artery  systolic pressure.   3. Left atrial size was moderately dilated.   4. Right atrial size was severely dilated.   5. The mitral valve is normal in structure. No evidence of mitral valve  regurgitation. No evidence of mitral stenosis.   6. Tricuspid valve regurgitation is moderate.   7. The aortic valve is normal in structure. Aortic valve regurgitation is  mild. No aortic stenosis is present.   8. The  inferior vena cava is dilated in size with <50% respiratory  variability, suggesting right atrial pressure of 15 mmHg.   Review of Systems: [y] = yes, [ ]  = no   General: Weight gain [ ] ; Weight loss [ ] ; Anorexia [ ] ; Fatigue [ ] ; Fever [ ] ; Chills [ ] ; Weakness [ ]   Cardiac: Chest pain/pressure [ ] ; Resting SOB [ ] ; Exertional SOB [ Y]; Orthopnea [ ] ; Pedal Edema [ ] ; Palpitations [ ] ; Syncope [ ] ; Presyncope  [ ] ; Paroxysmal nocturnal dyspnea[ ]   Pulmonary: Cough [ ] ; Wheezing[ ] ; Hemoptysis[ ] ; Sputum [ ] ; Snoring [ ]   GI: Vomiting[ ] ; Dysphagia[ ] ; Melena[ ] ; Hematochezia [ ] ; Heartburn[ ] ; Abdominal pain [ ] ; Constipation [ ] ; Diarrhea [ ] ; BRBPR [ ]   GU: Hematuria[ ] ; Dysuria [ ] ; Nocturia[ ]   Vascular: Pain in legs with walking [ ] ; Pain in feet with lying flat [ ] ; Non-healing sores [ ] ; Stroke [ ] ; TIA [ ] ; Slurred speech [ ] ;  Neuro: Headaches[ ] ; Vertigo[ ] ; Seizures[ ] ; Paresthesias[ ] ;Blurred vision [ ] ; Diplopia [ ] ; Vision changes [ ]   Ortho/Skin: Arthritis [ ] ; Joint pain [ ] ; Muscle pain [ ] ; Joint swelling [ ] ; Back Pain [ ] ; Rash [ ]   Psych: Depression[ ] ; Anxiety[ ]   Heme: Bleeding problems [ ] ; Clotting disorders [ ] ; Anemia [ ]   Endocrine: Diabetes [ ] ; Thyroid dysfunction[ ]    Past Medical History:  Diagnosis Date   Chronic systolic CHF (congestive heart failure)    a. 01/2015 Echo: EF 35-40%;  b. 10/2016 Echo: EF 30-35%, diff HK, mild MR, sev dil LA/RA, mod TR, PASP .   CKD (chronic kidney disease), stage III    Essential hypertension    a. with h/o HTN urgency in the setting of noncompliance.   Gout    NICM (nonischemic cardiomyopathy)    a. 01/2015 Echo: EF 35-40%;  b. 01/2015 Myoview: EF 30-44%, no ischemia/infarct;  c. 10/2016 Echo: EF 30-35%, diff HK.   Persistent atrial fibrillation    a. CHA2DS2VASc = 2-->Eliquis;  b. s/p failed DCCV in 11/2013;  c. 10/2016 Echo: severe biatrial enlargement.   PUD (peptic ulcer disease)     Current Outpatient Medications  Medication Sig Dispense Refill   allopurinol (ZYLOPRIM) 100 MG tablet Take 1 tablet (100 mg total) by mouth daily. 30 tablet 2   amiodarone (PACERONE) 200 MG tablet Take 1 tablet (200 mg total) by mouth daily. 30 tablet 0   dapagliflozin propanediol (FARXIGA) 10 MG TABS tablet Take 1 tablet (10 mg total) by mouth daily. 30 tablet 0   diclofenac Sodium (VOLTAREN) 1 % GEL Apply 4 g topically 4 (four) times daily.  100 g 2   ELIQUIS 5 MG TABS tablet TAKE 1 TABLET BY MOUTH 2 TIMES DAILY. 60 tablet 2   fluticasone furoate-vilanterol (BREO ELLIPTA) 100-25 MCG/ACT AEPB Inhale 1 puff into the lungs daily as needed (shortness of breath). 60 each 2   furosemide (LASIX) 40 MG tablet Take 1 tablet (40 mg total) by mouth daily. 30 tablet 0   metoprolol succinate (TOPROL-XL) 50 MG 24 hr tablet Take 1 tablet (50 mg total) by mouth daily. Take with or immediately following a meal. 30 tablet 0   MUCINEX 600 MG 12 hr tablet Take 600-1,200 mg by mouth 2 (two) times daily as needed for to loosen phlegm or cough.     oxycodone (OXY-IR) 5 MG capsule Take 1 capsule (5 mg total) by mouth every  6 (six) hours as needed for pain. 15 capsule 0   spironolactone (ALDACTONE) 25 MG tablet Take 1 tablet (25 mg total) by mouth daily. 30 tablet 0   VENTOLIN HFA 108 (90 Base) MCG/ACT inhaler Inhale 1-2 puffs into the lungs every 6 (six) hours as needed for wheezing or shortness of breath. 1 each 3   No current facility-administered medications for this encounter.    No Known Allergies    Social History   Socioeconomic History   Marital status: Married    Spouse name: Hieu   Number of children: 3   Years of education: Not on file   Highest education level: High school graduate  Occupational History    [employer]er: [employer]   Occupation: retired Midwifebus driver  Tobacco Use   Smoking status: Never   Smokeless tobacco: Never  Vaping Use   Vaping Use: Never used  Substance and Sexual Activity   Alcohol use: No    Comment: Occasional   Drug use: No   Sexual activity: Not on file  Other Topics Concern   Not on file  Social History Narrative   Not on file   Social Determinants of Health   Financial Resource Strain: Low Risk  (12/09/2022)   Overall Financial Resource Strain (CARDIA)    Difficulty of Paying Living Expenses: Not very hard  Food Insecurity: No Food Insecurity (12/07/2022)   Hunger Vital Sign     Worried About Running Out of Food in the Last Year: Never true    Ran Out of Food in the Last Year: Never true  Transportation Needs: No Transportation Needs (12/07/2022)   PRAPARE - Administrator, Civil ServiceTransportation    Lack of Transportation (Medical): No    Lack of Transportation (Non-Medical): No  Physical Activity: Not on file  Stress: Not on file  Social Connections: Not on file  Intimate Partner Violence: Not At Risk (12/07/2022)   Humiliation, Afraid, Rape, and Kick questionnaire    Fear of Current or Ex-Partner: No    Emotionally Abused: No    Physically Abused: No    Sexually Abused: No      Family History  Problem Relation Age of Onset   Kidney disease Mother    Diabetes Mother    Hypertension Father    Congestive Heart Failure Father    Kidney disease Sister    Stroke Sister    Drug abuse Brother    Heart disease Brother    Heart murmur Brother    Colon cancer Neg Hx     Vitals:   01/07/23 0909  BP: (!) 148/92  Pulse: (!) 113  SpO2: 98%  Weight: 93 kg (205 lb)    PHYSICAL EXAM: ReDs 39%  General:  Well appearing. No respiratory difficulty HEENT: normal Neck: supple. JVD 8 cm Carotids 2+ bilat; no bruits. No lymphadenopathy or thryomegaly appreciated. Cor: PMI nondisplaced. Irregularly irregular rhythm and rate. No rubs, gallops or murmurs. Lungs: clear Abdomen: soft, nontender, nondistended. No hepatosplenomegaly. No bruits or masses. Good bowel sounds. Extremities: no cyanosis, clubbing, rash, edema Neuro: alert & oriented x 3, cranial nerves grossly intact. moves all 4 extremities w/o difficulty. Affect pleasant.  ECG: Atrial Fibrillation, 123 bpm. QT 338 ms. QRS 110 ms    ASSESSMENT & PLAN:  1. Chronic Systolic Heart Failure - long standing, since 2016. Presumed to be NICM based on normal nuclear stress test but has never had definitive LHC  - suspect tachy mediated, given presence of permanent atrial fibrillation w/ poor  rate control  - Most recent echo 3/24 EF  25-30%, mod LAE, severe RAE w/ hyperdynamic RV, mildly elevated RVSP and mod TR, IVC dilated - while suspect tachymediated, he needs further investigation to r/o other potential etiologies. Recommend R/LHC and cMRI. Will refer to [facility] for further evaluation  - NYHA Class II. Mildly fluid overloaded on exam and by ReDs, 39%. This is in the setting of uncontrolled Afib. Warm on exam. No current signs of low output  - Add Entresto 24-26 mg bid - Continue Farxiga 10 mg daily  - Continue Spironolactone 25 mg daily  - Continue Toprol XL 50 mg daily  - Add Digoxin 0.125 mg daily - Continue Lasix 40 mg daily  - check BMP and BNP today  - f/u w/ Dr. Gasper Lloyd in 2 wks to discuss cath and cMRI    2. Atrial Fibrillation  - noted to be permanent, dates back to 2016  - suspect contributing to HF - reports failed DCCV attempts w/ ERAF  - doubt repeat DCCV would be successful given chronicity  - rates poorly controlled, 120s. BP ok  - Increase Amiodarone to 200 mg bid for rate control  - Add Digoxin 0.125 mg daily  - Continue Eliquis 5 mg bid - refer to EP for evaluation for Afib ablation  - needs sleep study to r/o OSA - repeat EKG and check dig level in 1 wk  - he will closely monitor HR and BP at home and was instructed to go to ED if condition worsens   3. Hypertension  - BP moderately elevated, 148/92 (prior to am meds)  - Add Entresto 24-26 mg bid  - Continue Spironolactone 25 mg daily  - Continue Toprol XL 50 mg daily  - Check BMP today and again in 7 days  - needs sleep study to assess for OSA   4. CKD IIIa - b/l Scr ~1.3 - check BMP today and again in 7 days w/ GDMT titration  - continue Farxiga    Follow up  labs in 1 wk (BMP and digoxin level) and repeat EKG in 1 wk. F/u w/ Dr. Gasper Lloyd in 2 wks

## 2023-01-07 NOTE — Progress Notes (Signed)
Height:      Weight: BMI:  Today's Date:  STOP BANG RISK ASSESSMENT S (snore) Have you been told that you snore?     YES   T (tired) Are you often tired, fatigued, or sleepy during the day?   YES  O (obstruction) Do you stop breathing, choke, or gasp during sleep? NO   P (pressure) Do you have or are you being treated for high blood pressure? YES   B (BMI) Is your body index greater than 35 kg/m? NO   A (age) Are you 64 years old or older? YES  N (neck) Do you have a neck circumference greater than 16 inches?   NO   G (gender) Are you a male? YES  TOTAL STOP/BANG "YES" ANSWERS                                                                        For Office Use Only              Procedure Order Form    YES to 3+ Stop Bang questions OR two clinical symptoms - patient qualifies for WatchPAT (CPT 95800)      Clinical Notes: Will consult Sleep Specialist and refer for management of therapy due to patient increased risk of Sleep Apnea. Ordering a sleep study due to the following two clinical symptoms: Excessive daytime sleepiness G47.10 / Gastroesophageal reflux K21.9 / Nocturia R35.1 / Morning Headaches G44.221 / Difficulty concentrating R41.840 / Memory problems or poor judgment G31.84 / Personality changes or irritability R45.4 / Loud snoring R06.83 / Depression F32.9 / Unrefreshed by sleep G47.8 / Impotence N52.9 / History of high blood pressure R03.0 / Insomnia G47.00

## 2023-01-07 NOTE — Progress Notes (Signed)
ReDS Vest / Clip - 01/07/23 0900       ReDS Vest / Clip   Station Marker C    Ruler Value 30    ReDS Value Range Moderate volume overload    ReDS Actual Value 39

## 2023-01-07 NOTE — Patient Instructions (Signed)
Medication Changes:  Start Digoxin. Take one tablet (0.125mg ) daily.  CHANGE: amiodarone. Take one tablet (200mg ), TWO times a day.   START: Entresto. Take one tablet (24-26mg ), TWO times a day.  Lab Work:  Labs done today, your results will be available in MyChart, we will contact you for abnormal readings.  Testing/Procedures:  Your provider has recommended that you have a home sleep study ([facility] Test).  We have provided you with the equipment in our office today. Please go ahead and download the app. DO NOT OPEN OR TAMPER WITH THE BOX UNTIL WE ADVISE YOU TO DO SO. Once insurance has approved the test our office will call you with PIN number and approval to proceed with testing. Once you have completed the test you just dispose of the equipment, the information is automatically uploaded to [name] via blue-tooth technology. If your test is positive for sleep apnea and you need a home CPAP machine you will be contacted by Dr Norris Cross office [facility]) to set this up. ** OUR office will call you after it is approved by your insurance company. **  Referrals:  You have been referred to the Advanced Heart Failure clinic to help better manage your cardiac issues regarding heart pump/strength. You will see Dr. Darvin Neighbours.   Special Instructions // Education:  Do the following things EVERYDAY: Weigh yourself in the morning before breakfast. Write it down and keep it in a log. Take your medicines as prescribed Eat low salt foods--Limit salt (sodium) to 2000 mg per day.  Stay as active as you can everyday Limit all fluids for the day to less than 2 liters  Follow-Up in: 2 weeks. April 25 @ 9AM.      At the [facility] Clinic, you and your health needs are our priority. We have a designated team specialized in the treatment of Heart Failure. This Care Team includes your primary Heart Failure Specialized Cardiologist (physician), Advanced Practice Providers (APPs- Physician  Assistants and Nurse Practitioners), and Pharmacist who all work together to provide you with the care you need, when you need it.   You may see any of the following providers on your designated Care Team at your next follow up:  Dr. Arvilla Meres Dr. Marca Ancona Dr. Marcos Eke, NP Robbie Lis, [place] [facility], [place] Brynda Peon, NP Karle Plumber, PharmD   Please be sure to bring in all your medications bottles to every appointment.   Need to Contact us:  If you have any questions or concerns before your next appointment please send [name] a message through [place] or call our office at [phone].    TO LEAVE A MESSAGE FOR THE NURSE SELECT OPTION 2, PLEASE LEAVE A MESSAGE INCLUDING: YOUR NAME DATE OF BIRTH CALL BACK NUMBER REASON FOR CALL**this is important as we prioritize the call backs  YOU WILL RECEIVE A CALL BACK THE SAME DAY AS LONG AS YOU CALL BEFORE 4:00 PM

## 2023-01-08 ENCOUNTER — Ambulatory Visit: Payer: BC Managed Care – PPO | Admitting: Family Medicine

## 2023-01-08 ENCOUNTER — Encounter: Payer: Self-pay | Admitting: Family Medicine

## 2023-01-08 ENCOUNTER — Other Ambulatory Visit: Payer: Self-pay

## 2023-01-08 ENCOUNTER — Other Ambulatory Visit (HOSPITAL_COMMUNITY): Payer: Self-pay

## 2023-01-08 VITALS — BP 128/99 | HR 101 | Ht 72.0 in | Wt 208.6 lb

## 2023-01-08 DIAGNOSIS — I10 Essential (primary) hypertension: Secondary | ICD-10-CM

## 2023-01-08 DIAGNOSIS — Z1211 Encounter for screening for malignant neoplasm of colon: Secondary | ICD-10-CM

## 2023-01-08 DIAGNOSIS — M25561 Pain in right knee: Secondary | ICD-10-CM | POA: Diagnosis not present

## 2023-01-08 DIAGNOSIS — G8929 Other chronic pain: Secondary | ICD-10-CM

## 2023-01-08 DIAGNOSIS — M1990 Unspecified osteoarthritis, unspecified site: Secondary | ICD-10-CM

## 2023-01-08 DIAGNOSIS — M25562 Pain in left knee: Secondary | ICD-10-CM

## 2023-01-08 MED ORDER — DICLOFENAC SODIUM 1 % EX GEL: 4.0000 g | Freq: Four times a day (QID) | CUTANEOUS | 2 refills | Status: DC

## 2023-01-08 NOTE — Assessment & Plan Note (Signed)
BP 128/99. Currently on Spiro 25mg  daily, Toprol 50mg  daily, Entresto 24-26mg  BID was started yesterday. Follows with Cardiology who adjusted medication yesterday. Will continue current regimen

## 2023-01-08 NOTE — Assessment & Plan Note (Signed)
Patient with intermittent pain and stiffness/locking of knees and elbows bilaterally likely due to arthritis. X ray previously performed showing mild degenerative changes. Not tried extra strength Tylenol, recommended taking Tylenol arthritis.  Also sent in Voltaren gel again as he has not started that. Referral previously placed to orthopedic surgery for further management. Recommended exercising/ staying active. Declines PT referral.

## 2023-01-08 NOTE — Progress Notes (Signed)
    SUBJECTIVE:   CHIEF COMPLAINT / HPI:   Patient presents for follow up on arthritis.   Was seen by Cardiology yesterday   A fib Amio increased to 200mg  BID. Added Digoxin 0.125mg  daily. Continued on Eliquis 5mg  BID. Referred to EP for ablation.   HTN  On Spiro 25mg  daily, Toprol 50mg  daily, Entresto 24-26mg  BID was started yesterday. He states he has not started the [place] yet as it is still going through insurance authorization.   Arthritis Patient endorses pain in knees and elbows. States elbows have been painful for the past 2 days. No inciting injury or trauma  Knees have been bothering him since his hospitalization. States his knees aren't that painful but sometimes locks up. States he used to exercise but not so much anymore. Does state he gets a lot of steps in.   Health maintenance Due for Colonoscopy- states he has not had one done Would like to wait for tetanus and COVID vaccines   PERTINENT  PMH / PSH:  HFrEF, permanent atrial fibrillation, HTN and CKDIIIa (b/l SCr ~1.3)   OBJECTIVE:   BP (!) 128/99   Pulse (!) 101   Ht 6' (1.829 m)   Wt 208 lb 9.6 oz (94.6 kg)   SpO2 94%   BMI 28.29 kg/m    Physical exam General: well appearing, NAD Cardiovascular: RRR, no murmurs Lungs: CTAB. Normal WOB Abdomen: soft, non-distended, non-tender Skin: warm, dry. No edema MSK: No erythema or bony abnormalities of elbows bilaterally. Normal ROM. Non tender to palpation   ASSESSMENT/PLAN:   Essential hypertension BP 128/99. Currently on Spiro 25mg  daily, Toprol 50mg  daily, Entresto 24-26mg  BID was started yesterday. Follows with Cardiology who adjusted medication yesterday. Will continue current regimen   Arthritis Patient with intermittent pain and stiffness/locking of knees and elbows bilaterally likely due to arthritis. X ray previously performed showing mild degenerative changes. Not tried extra strength Tylenol, recommended taking Tylenol arthritis.  Also sent in  Voltaren gel again as he has not started that. Referral previously placed to orthopedic surgery for further management. Recommended exercising/ staying active. Declines PT referral.    Health maintenance Order placed for colonoscopy  Declines Tdap and COVID vaccines   Cora Collum, DO [facility]

## 2023-01-08 NOTE — Assessment & Plan Note (Deleted)
Patient with intermittent pain and stiffness/locking of knees and elbows bilaterally likely due to arthritis. X ray previously performed showing mild degenerative changes. Not tried extra strength Tylenol, recommended taking Tylenol arthritis.  Also sent in Voltaren gel again as he has not started that. Referral previously placed to orthopedic surgery for further management. Recommended exercising/ staying active. Declines PT referral.   

## 2023-01-08 NOTE — Patient Instructions (Signed)
It was great seeing you today!  For your arthritis I have sent in for the Voltaren gel for the arthritis which you can use twice daily. I also recommend using the extra strength Tylenol arthritis.   I have placed the referral for your colonoscopy and given you the form with the numbers to call to schedule that appointment.   The orthopedic doctors should be calling you about the appointment for your knee and elbow pain.   Feel free to call with any questions or concerns at any time, at [phone].   Take care,  Dr. Cora Collum [facility]

## 2023-01-12 ENCOUNTER — Telehealth: Payer: Self-pay

## 2023-01-12 ENCOUNTER — Other Ambulatory Visit (HOSPITAL_COMMUNITY): Payer: Self-pay

## 2023-01-12 NOTE — Telephone Encounter (Signed)
A Prior Authorization was initiated for this patients DICLOFENAC GEL 1% through CoverMyMeds.   Key: [id number]

## 2023-01-13 NOTE — Telephone Encounter (Signed)
VOLTAREN GEL AVAILABLE OTC

## 2023-01-13 NOTE — Telephone Encounter (Signed)
Prior Auth for patients medication DICLOFENAC GEL denied by [facility] via CoverMyMeds.   Reason:

## 2023-01-14 ENCOUNTER — Encounter (HOSPITAL_COMMUNITY): Payer: BC Managed Care – PPO

## 2023-01-14 ENCOUNTER — Other Ambulatory Visit (HOSPITAL_COMMUNITY): Payer: BC Managed Care – PPO

## 2023-01-15 ENCOUNTER — Telehealth (HOSPITAL_COMMUNITY): Payer: Self-pay | Admitting: Surgery

## 2023-01-15 NOTE — Telephone Encounter (Signed)
I called patient to inform him that insurance precert is not needed for his ordered home sleep study and he can proceed with completion.  I left a message to indicate this information.

## 2023-01-16 ENCOUNTER — Telehealth (HOSPITAL_COMMUNITY): Payer: Self-pay | Admitting: Pharmacy Technician

## 2023-01-16 NOTE — Telephone Encounter (Signed)
Advanced Heart Failure Patient Advocate Encounter  Prior Authorization for Javier Berry has been approved.    PA# [id number] Effective dates: 01/16/23 through 01/15/24  Archer Asa, CPhT

## 2023-01-16 NOTE — Telephone Encounter (Signed)
Patient Advocate Encounter   Received notification from [employer] that prior authorization for Sherryll Burger is required.   PA submitted on CoverMyMeds Key [id number] Status is pending   Will continue to follow.

## 2023-01-22 ENCOUNTER — Encounter (HOSPITAL_COMMUNITY): Payer: BC Managed Care – PPO | Admitting: Cardiology

## 2023-02-09 ENCOUNTER — Other Ambulatory Visit (HOSPITAL_COMMUNITY): Payer: Self-pay | Admitting: Cardiology

## 2023-02-09 ENCOUNTER — Other Ambulatory Visit: Payer: Self-pay | Admitting: Family Medicine

## 2023-02-09 DIAGNOSIS — I5023 Acute on chronic systolic (congestive) heart failure: Secondary | ICD-10-CM

## 2023-02-11 ENCOUNTER — Ambulatory Visit: Payer: BC Managed Care – PPO | Attending: Cardiovascular Disease | Admitting: Cardiovascular Disease

## 2023-02-11 ENCOUNTER — Encounter: Payer: Self-pay | Admitting: Cardiovascular Disease

## 2023-02-11 VITALS — BP 146/96 | HR 46 | Ht 72.0 in | Wt 218.8 lb

## 2023-02-11 DIAGNOSIS — I480 Paroxysmal atrial fibrillation: Secondary | ICD-10-CM | POA: Diagnosis not present

## 2023-02-11 DIAGNOSIS — I4891 Unspecified atrial fibrillation: Secondary | ICD-10-CM | POA: Diagnosis not present

## 2023-02-11 DIAGNOSIS — I5023 Acute on chronic systolic (congestive) heart failure: Secondary | ICD-10-CM

## 2023-02-11 NOTE — Patient Instructions (Signed)
Medication Instructions:  Your physician recommends that you continue on your current medications as directed. Please refer to the Current Medication list given to you today. *If you need a refill on your cardiac medications before your next appointment, please call your pharmacy*  Testing/Procedures: Echocardiogram - schedule for mid June if possible Your physician has requested that you have an echocardiogram. Echocardiography is a painless test that uses sound waves to create images of your heart. It provides your doctor with information about the size and shape of your heart and how well your heart's chambers and valves are working. This procedure takes approximately one hour. There are no restrictions for this procedure. Please do NOT wear cologne, perfume, aftershave, or lotions (deodorant is allowed). Please arrive 15 minutes prior to your appointment time.   Follow-Up: At [facility], you and your health needs are our priority.  As part of our continuing mission to provide you with exceptional heart care, we have created designated Provider Care Teams.  These Care Teams include your primary Cardiologist (physician) and Advanced Practice Providers (APPs -  Physician Assistants and Nurse Practitioners) who all work together to provide you with the care you need, when you need it.  We recommend signing up for the patient portal called "MyChart".  Sign up information is provided on this After Visit Summary.  MyChart is used to connect with patients for Virtual Visits (Telemedicine).  Patients are able to view lab/test results, encounter notes, upcoming appointments, etc.  Non-urgent messages can be sent to your provider as well.   To learn more about what you can do with MyChart, go to [email or web address].    Your next appointment:   Follow up after echo completed  Provider:   York Pellant, MD

## 2023-02-11 NOTE — Progress Notes (Signed)
Electrophysiology Office Note:    Date:  02/11/2023   ID:  Javier Berry, DOB 1960/12/22, MRN [id number]  PCP:  Cora Collum, DO   [facility] Providers Cardiologist:  [name], MD     Referring MD: Tana Felts*   History of Present Illness:    Javier Berry is a 62 y.o. male with a hx listed below, significant for CHFrEF, CKD, HTN, and permanent atrial fibrillation referred for consideration of atrial fibrillation ablation.  He was initially diagnosed with atrial fibrillation no later than 2015 at which time he had an unsuccessful cardioversion. A monitor worn in July 2020 showed persistent atrial fibrillation. He reports that he has had multiple attempts at cardioversion but has had early return of AF.  Systolic heart failure was diagnosed in 2016 with an EF of 30-35%. TTE in 2016 showed severe left atrial enlargement, which may have had some positive remodeling over time since the most recent TTE is read as moderate LA enlargement though the RA was severely enlarged. EF has been waxing and waning over subsequent years and often declined after episodes of RVR.   He recently returned from a cruise. He did a lot of walking and felt good. He admits to dietary indiscretion and attributes his elevated BP today to that, as it was well-controlled, he reports, prior to his trip.  Past Medical History:  Diagnosis Date   Chronic systolic CHF (congestive heart failure) (HCC)    a. 01/2015 Echo: EF 35-40%;  b. 10/2016 Echo: EF 30-35%, diff HK, mild MR, sev dil LA/RA, mod TR, PASP .   CKD (chronic kidney disease), stage III (HCC)    Essential hypertension    a. with h/o HTN urgency in the setting of noncompliance.   Gout    NICM (nonischemic cardiomyopathy) (HCC)    a. 01/2015 Echo: EF 35-40%;  b. 01/2015 Myoview: EF 30-44%, no ischemia/infarct;  c. 10/2016 Echo: EF 30-35%, diff HK.   Persistent atrial fibrillation (HCC)    a. CHA2DS2VASc = 2-->Eliquis;  b.  s/p failed DCCV in 11/2013;  c. 10/2016 Echo: severe biatrial enlargement.   PUD (peptic ulcer disease)     Past Surgical History:  Procedure Laterality Date   CARDIOVERSION N/A 12/20/2013   Procedure: CARDIOVERSION;  Surgeon: Lewayne Bunting, MD;  Location: [facility] ENDOSCOPY;  Service: Cardiovascular;  Laterality: N/A;   TOE SURGERY      Current Medications: Current Meds  Medication Sig   allopurinol (ZYLOPRIM) 100 MG tablet Take 1 tablet (100 mg total) by mouth daily.   amiodarone (PACERONE) 200 MG tablet TAKE 1 TABLET BY MOUTH 2 TIMES DAILY.   diclofenac Sodium (VOLTAREN) 1 % GEL Apply 4 g topically 4 (four) times daily.   digoxin (LANOXIN) 0.125 MG tablet Take 1 tablet (0.125 mg total) by mouth daily.   ELIQUIS 5 MG TABS tablet TAKE 1 TABLET BY MOUTH 2 TIMES DAILY.   FARXIGA 10 MG TABS tablet TAKE 1 TABLET BY MOUTH EVERY DAY   fluticasone furoate-vilanterol (BREO ELLIPTA) 100-25 MCG/ACT AEPB Inhale 1 puff into the lungs daily as needed (shortness of breath).   furosemide (LASIX) 40 MG tablet Take 1 tablet (40 mg total) by mouth daily.   metoprolol succinate (TOPROL-XL) 50 MG 24 hr tablet TAKE 1 TABLET BY MOUTH DAILY. TAKE WITH OR IMMEDIATELY FOLLOWING A MEAL.   MUCINEX 600 MG 12 hr tablet Take 600-1,200 mg by mouth 2 (two) times daily as needed for to loosen phlegm or  cough.   oxycodone (OXY-IR) 5 MG capsule Take 1 capsule (5 mg total) by mouth every 6 (six) hours as needed for pain.   sacubitril-valsartan (ENTRESTO) 24-26 MG Take 1 tablet by mouth 2 (two) times daily.   spironolactone (ALDACTONE) 25 MG tablet TAKE 1 TABLET (25 MG TOTAL) BY MOUTH DAILY.   VENTOLIN HFA 108 (90 Base) MCG/ACT inhaler Inhale 1-2 puffs into the lungs every 6 (six) hours as needed for wheezing or shortness of breath.     Allergies:   Patient has no known allergies.   Social and Family History: Reviewed in Epic  ROS:   Please see the history of present illness.    All other systems reviewed and are  negative.  EKGs/Labs/Other Studies Reviewed Today:    Echocardiogram:  TTE 12/08/2022 EF 25-30%, global hypokinesis. LA is moderately dilated. RA is severely dilated   Monitors:  04/2022 Perstient AF with reasonably well-controlled rates  Stress testing:   Advanced imaging:   Cardiac catherization   EKG:  Last EKG results: today - Fine AF with slow ventricular response   Recent Labs: 04/02/2022: ALT 16; TSH 1.733 12/11/2022: Hemoglobin 14.9; Platelets 200 12/13/2022: Magnesium 2.0 01/07/2023: B Natriuretic Peptide 241.0; BUN 15; Creatinine, Ser 1.45; Potassium 3.9; Sodium 139     Physical Exam:    VS:  BP (!) 146/96   Pulse (!) 46   Ht 6' (1.829 m)   Wt 218 lb 12.8 oz (99.2 kg)   SpO2 98%   BMI 29.67 kg/m     Wt Readings from Last 3 Encounters:  02/11/23 218 lb 12.8 oz (99.2 kg)  01/08/23 208 lb 9.6 oz (94.6 kg)  01/07/23 205 lb (93 kg)     GEN: Well nourished, well developed in no acute distress CARDIAC: RRR, no murmurs, rubs, gallops RESPIRATORY:  Normal work of breathing MUSCULOSKELETAL: no edema    ASSESSMENT & PLAN:    Atrial fibrillation - permanent Symptomatic, episodes of poor rate control, associated with CHF exacerbation and tachycardia-mediated cardiomyopathy He has severe RA enlargement which raises concern for right atrial triggers, which are not so amenable to ablation AF is fine on ECG I think at this point the likelihood of durable sinus rhythm is very low  CHFrEF Appears well-compensated today Repeat TTE in June; hopefully EF will be recovered If EF remains < 35%, will discuss CRT-D  HTN He is going to resume his heart-healthy diet         Medication Adjustments/Labs and Tests Ordered: Current medicines are reviewed at length with the patient today.  Concerns regarding medicines are outlined above.  Orders Placed This Encounter  Procedures   EKG 12-Lead   ECHOCARDIOGRAM COMPLETE   No orders of the defined types were  placed in this encounter.    Signed, Maurice Small, MD  02/11/2023 10:00 AM    [facility]

## 2023-03-03 ENCOUNTER — Encounter (HOSPITAL_COMMUNITY): Payer: BC Managed Care – PPO | Admitting: Cardiology

## 2023-03-14 ENCOUNTER — Other Ambulatory Visit: Payer: Self-pay | Admitting: Family Medicine

## 2023-03-14 DIAGNOSIS — I5023 Acute on chronic systolic (congestive) heart failure: Secondary | ICD-10-CM

## 2023-03-16 ENCOUNTER — Other Ambulatory Visit: Payer: Self-pay | Admitting: Family Medicine

## 2023-03-16 DIAGNOSIS — I5023 Acute on chronic systolic (congestive) heart failure: Secondary | ICD-10-CM

## 2023-03-19 ENCOUNTER — Ambulatory Visit (HOSPITAL_COMMUNITY): Payer: BC Managed Care – PPO | Attending: Cardiovascular Disease

## 2023-03-19 ENCOUNTER — Encounter (HOSPITAL_COMMUNITY): Payer: Self-pay | Admitting: Cardiovascular Disease

## 2023-03-23 ENCOUNTER — Other Ambulatory Visit: Payer: Self-pay | Admitting: Family Medicine

## 2023-03-23 DIAGNOSIS — I5023 Acute on chronic systolic (congestive) heart failure: Secondary | ICD-10-CM

## 2023-03-26 ENCOUNTER — Ambulatory Visit: Payer: BC Managed Care – PPO | Attending: Cardiovascular Disease | Admitting: Cardiovascular Disease

## 2023-03-26 ENCOUNTER — Encounter: Payer: Self-pay | Admitting: Cardiovascular Disease

## 2023-03-26 ENCOUNTER — Telehealth: Payer: Self-pay

## 2023-03-26 NOTE — Telephone Encounter (Signed)
Patient was a No Show to his echocardiogram appointment on 03/19/23 - attempted to contact patient to reschedule the appointment with Dr Nelly Laurence as he would need the echo results to establish plan of care.

## 2023-03-27 ENCOUNTER — Other Ambulatory Visit: Payer: Self-pay | Admitting: Family Medicine

## 2023-03-31 ENCOUNTER — Other Ambulatory Visit (HOSPITAL_COMMUNITY): Payer: Self-pay | Admitting: Cardiology

## 2023-03-31 DIAGNOSIS — I5023 Acute on chronic systolic (congestive) heart failure: Secondary | ICD-10-CM

## 2023-05-19 ENCOUNTER — Other Ambulatory Visit: Payer: Self-pay | Admitting: Family Medicine

## 2023-05-19 DIAGNOSIS — I5023 Acute on chronic systolic (congestive) heart failure: Secondary | ICD-10-CM

## 2023-05-22 ENCOUNTER — Other Ambulatory Visit: Payer: Self-pay

## 2023-05-22 ENCOUNTER — Encounter: Payer: Self-pay | Admitting: Student

## 2023-05-22 ENCOUNTER — Ambulatory Visit (INDEPENDENT_AMBULATORY_CARE_PROVIDER_SITE_OTHER): Payer: BC Managed Care – PPO | Admitting: Student

## 2023-05-22 VITALS — BP 147/81 | HR 80 | Temp 98.6°F | Ht 72.0 in | Wt 214.4 lb

## 2023-05-22 DIAGNOSIS — I5023 Acute on chronic systolic (congestive) heart failure: Secondary | ICD-10-CM | POA: Diagnosis not present

## 2023-05-22 DIAGNOSIS — J069 Acute upper respiratory infection, unspecified: Secondary | ICD-10-CM | POA: Diagnosis not present

## 2023-05-22 DIAGNOSIS — I10 Essential (primary) hypertension: Secondary | ICD-10-CM

## 2023-05-22 MED ORDER — DAPAGLIFLOZIN PROPANEDIOL 10 MG PO TABS: 10.0000 mg | ORAL_TABLET | Freq: Every day | ORAL | 0 refills | Status: DC

## 2023-05-22 MED ORDER — SPIRONOLACTONE 25 MG PO TABS
25.0000 mg | ORAL_TABLET | Freq: Every day | ORAL | 0 refills | Status: DC
Start: 2023-05-22 — End: 2023-06-21

## 2023-05-22 MED ORDER — METOPROLOL SUCCINATE ER 50 MG PO TB24
50.0000 mg | ORAL_TABLET | Freq: Every day | ORAL | 2 refills | Status: DC
Start: 2023-05-22 — End: 2023-08-24

## 2023-05-22 NOTE — Patient Instructions (Signed)
It was wonderful to see you today. Thank you for allowing me to be a part of your care. Below is a short summary of what we discussed at your visit today:  Suspect your symptoms are more consistent with upper respiratory viral illness.  Since you are outside of 5-day mark I want to test you for COVID but I want you to continue using mask until you are symptom-free.  You can do Tylenol or ibuprofen for fever or pain.  I have also refilled your medications.    If you have any questions or concerns, please do not hesitate to contact us via phone or MyChart message.   Jerre Simon, MD [facility]

## 2023-05-22 NOTE — Telephone Encounter (Signed)
Wife calls nurse line requesting several medications.   Medications pended.   Will forward to PCP.

## 2023-05-22 NOTE — Assessment & Plan Note (Signed)
BP is elevated.  Patient endorses he is out of his medications. -Refilled patient's medication -Advised patient to keep a daily blood pressure logs -Return precautions provided

## 2023-05-22 NOTE — Progress Notes (Signed)
    SUBJECTIVE:   CHIEF COMPLAINT / HPI:   Patient is a 62 year old male presenting today for concerns of fevers at night for the past week.  These are subjective fevers patient has not checked his temperatures at home.  He said his fever does respond well to Tylenol and Claritin-D.  No known sick contact but on associated symptom include nasal congestion and productive cough with yellowish/green sputum.  He denies any shortness of breath, chest pain headache, body aches or sore throat.  PERTINENT  PMH / PSH: Reviewed  OBJECTIVE:   BP (!) 147/81   Pulse 80   Temp 98.6 F (37 C)   Ht 6' (1.829 m)   Wt 214 lb 6.4 oz (97.3 kg)   SpO2 96%   BMI 29.08 kg/m    Physical Exam General: Alert, well appearing, NAD HEENT: MMM, no tonsillar exudates or oropharyngeal erythema Cardiovascular: RRR, No Murmurs, Normal S2/S2 Respiratory: Coarse breath sounds with good air movement, no wheezing or Rales Abdomen: No distension or tenderness  ASSESSMENT/PLAN:   Viral illness 62 y.o. year old presents with fever, cough, and congestion. He is afebrile today and on exam has coarse breath sound with good air movement, and good work of breathing on RA . Overall presentation and exam is consistent with viral URI and possible acute bronchitis.  Will not test patient today for COVID given that he is past the 5-day mark for isolation. - Discussed conservative management with warm water, honey and lemon. - Recommended tylenol or ibuprofen for fever.  - Encouraged adequate hydration for patient. -Encourage patient to use mask until symptom-free or 10-day mark since start of symptoms. - Outline signs and symptoms that will warrant ED visit or return for further assessment.    Essential hypertension BP is elevated.  Patient endorses he is out of his medications. -Refilled patient's medication -Advised patient to keep a daily blood pressure logs -Return precautions provided    Jerre Simon, MD [facility]  [facility]

## 2023-05-28 ENCOUNTER — Telehealth: Payer: Self-pay

## 2023-05-28 NOTE — Telephone Encounter (Signed)
Patients wife calls nurse line reporting minimal improvement of symptoms.   She reports continued congestion and the need to use his inhaler more often.   She denies any worsening symptoms. No fevers or SOB.  She reports he was told if he failed to improve to call back for an antibiotic.   Will forward to provider who saw patient.

## 2023-05-29 MED ORDER — AMOXICILLIN 500 MG PO TABS: 1000.0000 mg | ORAL_TABLET | Freq: Three times a day (TID) | ORAL | 0 refills | Status: AC

## 2023-05-29 NOTE — Telephone Encounter (Signed)
Sent in Amoxicillin for acute sinusitis

## 2023-06-08 ENCOUNTER — Other Ambulatory Visit (HOSPITAL_COMMUNITY): Payer: Self-pay | Admitting: Cardiology

## 2023-06-17 ENCOUNTER — Other Ambulatory Visit: Payer: Self-pay | Admitting: Student

## 2023-06-17 DIAGNOSIS — I5023 Acute on chronic systolic (congestive) heart failure: Secondary | ICD-10-CM

## 2023-06-26 ENCOUNTER — Other Ambulatory Visit: Payer: Self-pay | Admitting: Family Medicine

## 2023-06-26 ENCOUNTER — Other Ambulatory Visit: Payer: Self-pay | Admitting: Student

## 2023-06-26 DIAGNOSIS — I5023 Acute on chronic systolic (congestive) heart failure: Secondary | ICD-10-CM

## 2023-07-16 ENCOUNTER — Other Ambulatory Visit: Payer: Self-pay

## 2023-07-16 MED ORDER — ELIQUIS 5 MG PO TABS: 5.0000 mg | ORAL_TABLET | Freq: Two times a day (BID) | ORAL | 2 refills | Status: DC

## 2023-07-25 ENCOUNTER — Other Ambulatory Visit: Payer: Self-pay | Admitting: Family Medicine

## 2023-07-25 DIAGNOSIS — I5023 Acute on chronic systolic (congestive) heart failure: Secondary | ICD-10-CM

## 2023-08-03 ENCOUNTER — Ambulatory Visit: Payer: BC Managed Care – PPO

## 2023-08-03 NOTE — Progress Notes (Deleted)
    SUBJECTIVE:   Chief compliant/HPI: annual examination  Javier Berry is a 62 y.o. who presents today for an annual exam.   History tabs reviewed and updated ***.   Review of systems form reviewed and notable for ***.   OBJECTIVE:   There were no vitals taken for this visit.  ***  ASSESSMENT/PLAN:   No problem-specific Assessment & Plan notes found for this encounter.    Annual Examination  See AVS for age appropriate recommendations.  PHQ score ***, reviewed and discussed.  Blood pressure value is *** goal, discussed.   Considered the following screening exams based upon USPSTF recommendations: Diabetes screening: {discussed/ordered:14545} Screening for elevated cholesterol: {discussed/ordered:14545} HIV testing: {discussed/ordered:14545} Neg 04/02/22 Hepatitis C: {discussed/ordered:14545} Neg 12/18/22 Hepatitis B: {discussed/ordered:14545} Syphilis if at high risk: {discussed/ordered:14545} Reviewed risk factors for latent tuberculosis and {not indicated/requested/declined:14582} Colorectal cancer screening: {crcscreen:23821::"discussed, colonoscopy ordered"} Lung cancer screening: {discussed/declined/written ZOXW:96045} See documentation below regarding discussion and indication.  PSA discussed and after engaging in discussion of possible risks, benefits and complications of screening patient elected to ***.   Follow up in 1 year or sooner if indicated.    Bess Kinds, MD [facility]

## 2023-08-10 ENCOUNTER — Ambulatory Visit: Payer: BC Managed Care – PPO | Admitting: Family Medicine

## 2023-08-10 ENCOUNTER — Encounter: Payer: Self-pay | Admitting: Family Medicine

## 2023-08-10 VITALS — BP 181/81 | HR 42 | Ht 72.0 in | Wt 224.0 lb

## 2023-08-10 DIAGNOSIS — I10 Essential (primary) hypertension: Secondary | ICD-10-CM

## 2023-08-10 DIAGNOSIS — Z23 Encounter for immunization: Secondary | ICD-10-CM

## 2023-08-10 DIAGNOSIS — I420 Dilated cardiomyopathy: Secondary | ICD-10-CM | POA: Diagnosis not present

## 2023-08-10 MED ORDER — SPIRONOLACTONE 50 MG PO TABS: 50.0000 mg | ORAL_TABLET | Freq: Every day | ORAL | 0 refills | Status: DC

## 2023-08-10 NOTE — Patient Instructions (Addendum)
It was wonderful to see you today.  Please bring ALL of your medications with you to every visit.   Today we talked about:  You are due for your colonoscopy. The GI office tried to call you multiple times and left a voice mail, so you will have to call them back to schedule the colonoscopy. Please call this number below.  [facility] Address: [address] [place] [address], [place], [place] [address] Phone: [phone]  Blood Pressure - Your blood pressure was quite high today. We increased one of your medications, spironolactone, to 50 mg from 25 mg. I highly recommend you call your cardiologist's office to make a follow up appointment within a week or two to see if they need to make any medication changes.   Thank you for choosing [facility].   Please call [phone] with any questions about today's appointment.  Lockie Mola, MD  Family Medicine      Blood Pressure Record Sheet To take your blood pressure, you will need a blood pressure machine. You can buy a blood pressure machine (blood pressure monitor) at your clinic, drug store, or online. When choosing one, consider: An automatic monitor that has an arm cuff. A cuff that wraps snugly around your upper arm. You should be able to fit only one finger between your arm and the cuff. A device that stores blood pressure reading results. Do not choose a monitor that measures your blood pressure from your wrist or finger. Follow your health care provider's instructions for how to take your blood pressure. To use this form: Take your blood pressure medications every day These measurements should be taken when you have been at rest for at least 10-15 min Take at least 2 readings with each blood pressure check. This makes sure the results are correct. Wait 1-2 minutes between measurements. Write down the results in the spaces on this form. Keep in mind it should always be recorded systolic over  diastolic. Both numbers are important.  Repeat this every day for 2-3 weeks, or as told by your health care provider.  Make a follow-up appointment with your health care provider to discuss the results.  Blood Pressure Log Date Medications taken? (Y/N) Blood Pressure Time of Day

## 2023-08-10 NOTE — Progress Notes (Signed)
    SUBJECTIVE:   Chief compliant/HPI: annual examination  Javier Berry is a 62 y.o. who presents today for an annual exam.    Hypertension  Patient says that he has been going to the gym every other day now thought that his blood pressure would be much better.  He says his blood pressure has never been this high.  He says that his wife helps him with his medications says he is not completely sure which medications he takes and if he has been out of any.  We called his wife from the visit she says that she is always refilled medications before they run out and has not run out of any medications.  Patient does not check blood pressure at home.  Does eat out a lot and says he might be consuming a lot of sodium.  Otherwise no significant changes from prior.  Patient does say that his cardiologist change Lasix 40 mg to as needed for increasing fluid.  And there are no this change is not reflected.   History tabs reviewed and updated.   Review of systems form reviewed and notable for allergy symptoms.   OBJECTIVE:   BP (!) 181/81   Pulse (!) 42   Ht 6' (1.829 m)   Wt 224 lb (101.6 kg)   SpO2 100%   BMI 30.38 kg/m   General: well appearing, in no acute distress CV: Irregular rhythm, normal rate, radial pulses equal and palpable, no BLE edema, cap refill < 2 seconds, No JVD  Resp: Normal work of breathing on room air, CTAB Abd: Soft, non tender, non distended  Psyc: very pleasant     ASSESSMENT/PLAN:   Assessment & Plan Essential hypertension Blood pressure very uncontrolled today.  Most likely needs augmentation of his medication regimen. - Counseled regarding sodium in diet and gave options for sodium free seasoning etc. - Continue exercise, avoid very heavy lifting. - Increased Aldactone to 50 mg from 25 mg. - BMP, urine microalbumin/creatinine - Patient will call cardiologist to make a follow-up appointment this week or next week to adjust his medications. Encounter for  immunization Received flu, COVID, Tdap vaccines, will get his Shingrix vaccine at the pharmacy.  Annual Examination  See AVS for age appropriate recommendations.  PHQ score 3, reviewed and discussed.  Blood pressure value is not at goal, discussed and plan as above.  Considered the following screening exams based upon USPSTF recommendations: Diabetes screening: discussed glucose has been normal on other BMPs so we will defer A1c for now. Screening for elevated cholesterol: ordered HIV testing: discussed Hepatitis C: discussed Hepatitis B: discussed Syphilis if at high risk:  Not high risk Reviewed risk factors for latent tuberculosis and not indicated Colorectal cancer screening:  Referral has previously been made however [facility] has left multiple voicemails without patient response.  Patient says his wife has made an appointment however there is no future encounters listed.  Offered patient phone number for [facility] to call and confirm colonoscopy appointment.  No anal rectal bleeding or other GI symptoms. Lung cancer screening:  Not indicated no tobacco use history. PSA discussed and after engaging in discussion of possible risks, benefits and complications of screening patient elected to defer.   Follow-up in 3 weeks for blood pressure management.   Lockie Mola, MD [facility]

## 2023-08-10 NOTE — Assessment & Plan Note (Addendum)
Blood pressure very uncontrolled today.  Most likely needs augmentation of his medication regimen. - Counseled regarding sodium in diet and gave options for sodium free seasoning etc. - Continue exercise, avoid very heavy lifting. - Increased Aldactone to 50 mg from 25 mg. - BMP, urine microalbumin/creatinine - Patient will call cardiologist to make a follow-up appointment this week or next week to adjust his medications.

## 2023-08-10 NOTE — Assessment & Plan Note (Signed)
Blood pressure not controlled today. Patient prescribed

## 2023-08-11 LAB — MICROALBUMIN / CREATININE URINE RATIO
Creatinine, Urine: 148.7 mg/dL
Microalb/Creat Ratio: 65 mg/g{creat} — ABNORMAL HIGH (ref 0–29)
Microalbumin, Urine: 96.8 ug/mL

## 2023-08-11 LAB — BASIC METABOLIC PANEL
BUN/Creatinine Ratio: 9 — ABNORMAL LOW (ref 10–24)
BUN: 15 mg/dL (ref 8–27)
CO2: 24 mmol/L (ref 20–29)
Calcium: 9.2 mg/dL (ref 8.6–10.2)
Chloride: 107 mmol/L — ABNORMAL HIGH (ref 96–106)
Creatinine, Ser: 1.69 mg/dL — ABNORMAL HIGH (ref 0.76–1.27)
Glucose: 79 mg/dL (ref 70–99)
Potassium: 4.7 mmol/L (ref 3.5–5.2)
Sodium: 142 mmol/L (ref 134–144)
eGFR: 45 mL/min/{1.73_m2} — ABNORMAL LOW (ref >59)

## 2023-08-11 LAB — LIPID PANEL
Chol/HDL Ratio: 3.5 ratio (ref 0.0–5.0)
Cholesterol, Total: 152 mg/dL (ref 100–199)
HDL: 44 mg/dL (ref >39)
LDL Chol Calc (NIH): 92 mg/dL (ref 0–99)
Triglycerides: 86 mg/dL (ref 0–149)
VLDL Cholesterol Cal: 16 mg/dL (ref 5–40)

## 2023-08-22 ENCOUNTER — Other Ambulatory Visit: Payer: Self-pay | Admitting: Student

## 2023-08-22 DIAGNOSIS — I5023 Acute on chronic systolic (congestive) heart failure: Secondary | ICD-10-CM

## 2023-08-23 ENCOUNTER — Other Ambulatory Visit: Payer: Self-pay | Admitting: Family Medicine

## 2023-08-23 DIAGNOSIS — I5023 Acute on chronic systolic (congestive) heart failure: Secondary | ICD-10-CM

## 2023-08-31 ENCOUNTER — Telehealth: Payer: Self-pay | Admitting: Family Medicine

## 2023-08-31 NOTE — Telephone Encounter (Signed)
Talked to Mr. Naff's wife as patient did not answer mobile phone. She says that patient's blood pressure readings have improced at home and patient does not have any adverse symptoms.Has appointment on 12/4 will recheck BMP then.

## 2023-09-02 ENCOUNTER — Ambulatory Visit: Payer: Self-pay | Admitting: Family Medicine

## 2023-09-02 NOTE — Progress Notes (Unsigned)
SUBJECTIVE:   CHIEF COMPLAINT: heart failure  HPI:   Javier Berry is a 62 y.o.  with history notable for atrial fibrillation, heart failure with reduced EF, and HTN presenting for medication check up.  HTN Reports compliance with medications  Initial blood pressure 190/80 and similar on repeat  The patient reports compliance with medications.  He is taking the increased dose of spironolactone.  He denies headaches, chest pain, blurry vision.  He reports he feels overall well he has no concerns and is trying to be physically active.  He does report his diet could be better particular on the holidays.  Cough The patient reports sinus problems.  In describing this he has had a cough for 2 weeks.  This is minimally productive with mucus.  No hemoptysis, no fevers, no nausea or vomiting.  He has tried Tylenol for this without relief.  He is not using his Hailer as prescribed.  He denies orthopnea or pedal edema which is her typical symptoms of heart failure for him.  He reports no sick contacts.  He denies dyspnea.  Low libido  The patient reports having erections that do not last as long and having a lower libido.  Denies other symptoms.  He wonders if there is a vitamin to take for this.  PERTINENT  PMH / PSH/Family/Social History :  Atrial fibrillation- follows with Dr. Jens Som HFrEF    OBJECTIVE:   BP (!) 190/75   Pulse (!) 44   Ht 6' (1.829 m)   Wt 223 lb (101.2 kg)   SpO2 100%   BMI 30.24 kg/m   Today's weight:  Last Weight  Most recent update: 09/03/2023  9:24 AM    Weight  101.2 kg (223 lb)            Review of prior weights: Filed Weights   09/03/23 0924  Weight: 223 lb (101.2 kg)    HEENT: EOMI. Sclera without injection or icterus. MMM. Oropharynx with some drainage  Neck: Supple.  Cardiac: Regular rate and rhythm. Normal S1/S2. No murmurs, rubs, or gallops appreciated. Lungs: Clear bilaterally to ascultation.  No edema  Psych: Pleasant and  appropriate    ASSESSMENT/PLAN:   Assessment & Plan PAF (paroxysmal atrial fibrillation) (HCC) Given the amiodarone can affect thyroid function which could subsequently affect libido TSH today We will also check a CBC for anemia given this could also affect libido Persistent cough Differential includes postviral cough, bronchitis he is a non-smoker and thus although COPD is listed I do not strongly suspect this.  Also consider pneumonia or pulmonary edema.  Given his history we will obtain a chest x-ray.  Recommended restarting his combination inhaler.  If he is not improving in the next few weeks could consider further evaluation.  Do not suspect acute sinusitis or other cause at this point. Acute on chronic systolic CHF (congestive heart failure) (HCC) Refilled empagliflozin and metoprolol per patient Essential hypertension Not at goal.  We discussed at length.  He recently increased his spironolactone repeat BMP today.  We discussed options.  Recommend checking his blood pressure at home as I suspect there is a component of whitecoat hypertension.  Scheduled for 24-hour monitor.  Low Libido We discussed-- he still does get erections and in the morning.  I suspect this is in part due to some of his medications as well as normal decline in testosterone as he ages.  Check CBC and TSH as above.  We discussed Viagra briefly.  Once his blood pressure is better controlled I suspect given the degree of activity this would be perfectly safe to use intermittently.  I did not recommend any particular vitamins as there is no data for using any supplements to increase libido.  Terisa Starr, MD  Family Medicine Teaching Service  [facility]

## 2023-09-03 ENCOUNTER — Ambulatory Visit
Admission: RE | Admit: 2023-09-03 | Discharge: 2023-09-03 | Disposition: A | Discharge status: Home / Self Care | Payer: BC Managed Care – PPO | Source: Ambulatory Visit | Attending: Family Medicine | Admitting: Family Medicine

## 2023-09-03 ENCOUNTER — Ambulatory Visit: Payer: BC Managed Care – PPO | Admitting: Student

## 2023-09-03 VITALS — BP 190/75 | HR 44 | Ht 72.0 in | Wt 223.0 lb

## 2023-09-03 DIAGNOSIS — I10 Essential (primary) hypertension: Secondary | ICD-10-CM

## 2023-09-03 DIAGNOSIS — R053 Chronic cough: Secondary | ICD-10-CM | POA: Diagnosis not present

## 2023-09-03 DIAGNOSIS — I5023 Acute on chronic systolic (congestive) heart failure: Secondary | ICD-10-CM

## 2023-09-03 DIAGNOSIS — I480 Paroxysmal atrial fibrillation: Secondary | ICD-10-CM

## 2023-09-03 DIAGNOSIS — I5022 Chronic systolic (congestive) heart failure: Secondary | ICD-10-CM

## 2023-09-03 MED ORDER — FLUTICASONE FUROATE-VILANTEROL 100-25 MCG/ACT IN AEPB: 1.0000 | INHALATION_SPRAY | Freq: Every day | RESPIRATORY_TRACT | 2 refills | Status: AC

## 2023-09-03 MED ORDER — ELIQUIS 5 MG PO TABS: 5.0000 mg | ORAL_TABLET | Freq: Two times a day (BID) | ORAL | 2 refills | Status: DC

## 2023-09-03 MED ORDER — DICLOFENAC SODIUM 1 % EX GEL: 4.0000 g | Freq: Four times a day (QID) | CUTANEOUS | 2 refills | Status: DC

## 2023-09-03 MED ORDER — FUROSEMIDE 40 MG PO TABS: 40.0000 mg | ORAL_TABLET | Freq: Every day | ORAL | 3 refills | Status: DC

## 2023-09-03 MED ORDER — ALLOPURINOL 100 MG PO TABS: 100.0000 mg | ORAL_TABLET | Freq: Every day | ORAL | 3 refills | Status: DC

## 2023-09-03 MED ORDER — METOPROLOL SUCCINATE ER 50 MG PO TB24: 50.0000 mg | ORAL_TABLET | Freq: Every day | ORAL | 3 refills | Status: DC

## 2023-09-03 MED ORDER — SPIRONOLACTONE 50 MG PO TABS: 50.0000 mg | ORAL_TABLET | Freq: Every day | ORAL | 0 refills | Status: DC

## 2023-09-03 MED ORDER — DAPAGLIFLOZIN PROPANEDIOL 10 MG PO TABS: 10.0000 mg | ORAL_TABLET | Freq: Every day | ORAL | 3 refills | Status: DC

## 2023-09-03 NOTE — Assessment & Plan Note (Addendum)
Given the amiodarone can affect thyroid function which could subsequently affect libido TSH today We will also check a CBC for anemia given this could also affect libido

## 2023-09-03 NOTE — Patient Instructions (Addendum)
It was wonderful to see you today.  Please bring ALL of your medications with you to every visit.   Today we talked about: Blood pressure  - Please send me values from the morning 3 times a week  - Continue your medications  - We will adjust based upon home readings  I will call or send a letter with labs  For your sex drive - Your medications that help you live longer can affect this - We can consider Viagra once your blood pressure is a bit better - There are no vitamins for this---I do not recommend spending money on any  I have sent in your refills   For your cough  An x-ray was ordered for you---you do not need an appointment to have this completed.  I recommend going to [facility] [address] [place]  If the results are normal,I will send you a letter  I will call you with results if anything is abnormal   I recommend using your inhaler twice a day  A cough can take up to 8 weeks to go away    Please follow up in 2 weeks with Dr. Raymondo Band    Thank you for choosing [facility].   Please call [phone] with any questions about today's appointment.  Please be sure to schedule follow up at the front  desk before you leave today.   Terisa Starr, MD  Family Medicine

## 2023-09-03 NOTE — Assessment & Plan Note (Signed)
Not at goal.  We discussed at length.  He recently increased his spironolactone repeat BMP today.  We discussed options.  Recommend checking his blood pressure at home as I suspect there is a component of whitecoat hypertension.  Scheduled for 24-hour monitor.

## 2023-09-04 ENCOUNTER — Telehealth: Payer: Self-pay | Admitting: Family Medicine

## 2023-09-04 LAB — BASIC METABOLIC PANEL
BUN/Creatinine Ratio: 8 — ABNORMAL LOW (ref 10–24)
BUN: 15 mg/dL (ref 8–27)
CO2: 24 mmol/L (ref 20–29)
Calcium: 9.7 mg/dL (ref 8.6–10.2)
Chloride: 104 mmol/L (ref 96–106)
Creatinine, Ser: 1.89 mg/dL — ABNORMAL HIGH (ref 0.76–1.27)
Glucose: 87 mg/dL (ref 70–99)
Potassium: 4.7 mmol/L (ref 3.5–5.2)
Sodium: 143 mmol/L (ref 134–144)
eGFR: 39 mL/min/{1.73_m2} — ABNORMAL LOW (ref >59)

## 2023-09-04 LAB — CBC
Hematocrit: 47.6 % (ref 37.5–51.0)
Hemoglobin: 15.6 g/dL (ref 13.0–17.7)
MCH: 32.2 pg (ref 26.6–33.0)
MCHC: 32.8 g/dL (ref 31.5–35.7)
MCV: 98 fL — ABNORMAL HIGH (ref 79–97)
Platelets: 160 10*3/uL (ref 150–450)
RBC: 4.84 x10E6/uL (ref 4.14–5.80)
RDW: 14.3 % (ref 11.6–15.4)
WBC: 5.9 10*3/uL (ref 3.4–10.8)

## 2023-09-04 LAB — TSH RFX ON ABNORMAL TO FREE T4: TSH: 2.75 u[IU]/mL (ref 0.450–4.500)

## 2023-09-04 NOTE — Telephone Encounter (Signed)
Labs reviewed--appropriate-needs a BMP when he sees Dr. Raymondo Band.  Left generic voicemail--he is going to send along home BP values  Terisa Starr, MD  [facility] Medicine Teaching Service

## 2023-09-06 ENCOUNTER — Other Ambulatory Visit: Payer: Self-pay | Admitting: Family Medicine

## 2023-09-06 DIAGNOSIS — I5023 Acute on chronic systolic (congestive) heart failure: Secondary | ICD-10-CM

## 2023-09-14 ENCOUNTER — Encounter: Payer: Self-pay | Admitting: Family Medicine

## 2023-09-15 ENCOUNTER — Encounter: Payer: Self-pay | Admitting: Pharmacist

## 2023-09-15 ENCOUNTER — Ambulatory Visit: Payer: BC Managed Care – PPO | Admitting: Pharmacist

## 2023-09-15 VITALS — BP 196/112 | Ht 72.0 in | Wt 218.2 lb

## 2023-09-15 DIAGNOSIS — I10 Essential (primary) hypertension: Secondary | ICD-10-CM | POA: Diagnosis not present

## 2023-09-15 NOTE — Progress Notes (Signed)
   S:     Chief Complaint  Patient presents with   Blood pressure monitor    Medication Management    BP monitor Day 1   62 y.o. male who presents for hypertension evaluation, education, and management. Patient arrives in fairly good spirits and presents without any assistance.   PMH is significant for longstanding hypertension.  Patient was referred and last seen by Primary Care Provider, Dr. Jena Gauss , on 09/03/2023.   At last visit, ambulatory blood pressure assessment was planned.   Medication compliance is reported to be good.  Discussed procedure for wearing the monitor and gave patient written instructions. Monitor was placed on non-dominant arm with instructions to return in the morning.   Current BP Medications include:  metoprolol succinate 50mg  daily, spironolactone 25mg  daily, Entresto 24-26 BID and furosemide 40mg  daily  Placing cuff was challenging due to difficulty with readings.  Wrist cuff was found to be effective.   O:  Review of Systems  All other systems reviewed and are negative.   Physical Exam Vitals reviewed.  Pulmonary:     Effort: Pulmonary effort is normal.     Last 3 Office BP readings: BP Readings from Last 3 Encounters:  09/03/23 (!) 190/75  08/10/23 (!) 181/81  05/22/23 (!) 147/81    Clinical Atherosclerotic Cardiovascular Disease (ASCVD): Yes  The 10-year ASCVD risk score (Arnett DK, et al., 2019) is: 29.1%   Values used to calculate the score:     Age: 33 years     Sex: Male     Is Non-Hispanic African American: Yes     Diabetic: No     Tobacco smoker: No     Systolic Blood Pressure: 190 mmHg     Is BP treated: Yes     HDL Cholesterol: 44 mg/dL     Total Cholesterol: 152 mg/dL  Basic Metabolic Panel    Component Value Date/Time   NA 143 09/03/2023 0955   K 4.7 09/03/2023 0955   CL 104 09/03/2023 0955   CO2 24 09/03/2023 0955   GLUCOSE 87 09/03/2023 0955   GLUCOSE 106 (H) 01/07/2023 1033   BUN 15 09/03/2023 0955    CREATININE 1.89 (H) 09/03/2023 0955   CREATININE 1.54 (H) 12/11/2016 1150   CALCIUM 9.7 09/03/2023 0955   GFRNONAA 54 (L) 01/07/2023 1033   GFRAA 66 07/06/2018 1152    Renal function: Estimated Creatinine Clearance: 48.1 mL/min (A) (by C-G formula based on SCr of 1.89 mg/dL (H)).   ABPM Study Data: Arm Placement  right wrist   For Office Goal BP of <140 mmHg Systolic - lower if tolerated:  ABPM thresholds: Overall Systolic BP <130 mmHg, daytime BP <135 mmHg, sleeptime BP <120 mmHg    A/P: History of hypertension longstanding currently taking multiple blood pressure agents. Goal presssure of <136mm Hg is initial goal.     -Placed blood pressure cuff, provided education, patient instructed to wear cuff for 24 hours and return tomorrow to review results. BMET obtained today.    Written patient instructions provided including activity/symptom/event log. Patient verbalized understanding of plan. Total time in face to face counseling 24 minutes.    Follow-up: Tomorrow AM - early morning appointment 9:00-9:30

## 2023-09-15 NOTE — Patient Instructions (Signed)
Blood Pressure Activity Diary Time Lying down/ Sleeping Walking/ Exercise Stressed/ Angry Headache/ Pain Dizzy  9 AM       10 AM       11 AM       12 PM       1 PM       2 PM       Time Lying down/ Sleeping Walking/ Exercise Stressed/ Angry Headache/ Pain Dizzy  3 PM       4 PM        5 PM       6 PM       7 PM       8 PM       Time Lying down/ Sleeping Walking/ Exercise Stressed/ Angry Headache/ Pain Dizzy  9 PM       10 PM       11 PM       12 AM       1 AM       2 AM       3 AM       Time Lying down/ Sleeping Walking/ Exercise Stressed/ Angry Headache/ Pain Dizzy  4 AM       5 AM       6 AM       7 AM       8 AM       9 AM       10 AM        Time you woke up: _________                  Time you went to sleep:__________  Come back tomorrow at 8:30 to have the monitor removed  Call the [facility] if you have any questions before then ([phone])  Wearing the Blood Pressure Monitor The cuff will inflate every 20 minutes during the day and every 30 minutes while you sleep. Fill out the blood pressure-activity diary during the day, especially during activities that may affect your reading -- such as exercise, stress, walking, taking your blood pressure medications  Important things to know: Avoid taking the monitor off for the next 24 hours, unless it causes you discomfort or pain. Do NOT get the monitor wet and do NOT try to clean the monitor with any cleaning products. Do NOT put the monitor on anyone else's arm. When the cuff inflates, avoid excess movement. Let the cuffed arm hang loosely, slightly away from the body. Avoid flexing the muscles or moving the hand/fingers. Remember to fill out the blood pressure activity diary. If you experience severe pain or unusual pain (not associated with getting your blood pressure checked), remove the monitor.  Troubleshooting:  Code  Troubleshooting   1  Check cuff position, tighten cuff   2, 3  Remain still  during reading   4, 87  Check air hose connections and make sure cuff is tight   85, 89  Check hose connections and make tubing is not crimped   86  Push START/STOP to restart reading   88, 91  Retry by pushing START/STOP   90  Replace batteries. If problem persists, remove monitor and bring back to   clinic at follow up   97, 98, 99  Service required - Remove monitor and bring back to clinic at follow up

## 2023-09-15 NOTE — Assessment & Plan Note (Signed)
History of hypertension longstanding currently taking multiple blood pressure agents. Goal presssure of <185mm Hg is initial goal.     -Placed blood pressure cuff, provided education, patient instructed to wear cuff for 24 hours and return tomorrow to review results.

## 2023-09-16 ENCOUNTER — Ambulatory Visit (INDEPENDENT_AMBULATORY_CARE_PROVIDER_SITE_OTHER): Payer: BC Managed Care – PPO | Admitting: Pharmacist

## 2023-09-16 VITALS — BP 138/67 | HR 51

## 2023-09-16 DIAGNOSIS — I5023 Acute on chronic systolic (congestive) heart failure: Secondary | ICD-10-CM

## 2023-09-16 DIAGNOSIS — I10 Essential (primary) hypertension: Secondary | ICD-10-CM | POA: Diagnosis not present

## 2023-09-16 LAB — BASIC METABOLIC PANEL
BUN/Creatinine Ratio: 14 (ref 10–24)
BUN: 36 mg/dL — ABNORMAL HIGH (ref 8–27)
CO2: 22 mmol/L (ref 20–29)
Calcium: 9.9 mg/dL (ref 8.6–10.2)
Chloride: 102 mmol/L (ref 96–106)
Creatinine, Ser: 2.53 mg/dL — ABNORMAL HIGH (ref 0.76–1.27)
Glucose: 100 mg/dL — ABNORMAL HIGH (ref 70–99)
Potassium: 5 mmol/L (ref 3.5–5.2)
Sodium: 138 mmol/L (ref 134–144)
eGFR: 28 mL/min/{1.73_m2} — ABNORMAL LOW (ref >59)

## 2023-09-16 MED ORDER — AMIODARONE HCL 200 MG PO TABS: 200.0000 mg | ORAL_TABLET | Freq: Every day | ORAL | Status: DC

## 2023-09-16 NOTE — Patient Instructions (Signed)
It was nice to see you today!  Thank you for completing the blood pressure monitoring evaluation.  Your goal blood pressure is < 130/80 mmHg   Medication Changes: Decrease Amiodarone 200mg  ONCE daily in the morning - STOP the evening dose.   Discontinue digoxin TODAY  Continue all other medication the same.   Monitor blood pressure at home and keep a log (on a piece of paper) to bring with you to your next visit.

## 2023-09-16 NOTE — Assessment & Plan Note (Signed)
Expand All Collapse All    S:          Chief Complaint  Patient presents with   Medication Management      Amb BP Measurement - Day #2    62 y.o. male who presents for hypertension evaluation, education, and management.  Patient arrives in good spirits and presents without any assistance.    PMH is significant for cardiomyopathy and afib.  Patient returns to clinic with 24 hour blood pressure monitor and reports no issues with monitor.  Patient reports they were able to wear the Ambulatory Blood Pressure Cuff for the entire 24 evaluation period.    Patient has NOT returned to Cardiology in > 6 months.      O:  Review of Systems  Constitutional:  Positive for malaise/fatigue (intermittent).  All other systems reviewed and are negative.     Physical Exam Constitutional:      Appearance: Normal appearance.  Cardiovascular:     Rate and Rhythm: Bradycardia present.  Pulmonary:     Effort: Pulmonary effort is normal.  Neurological:     Mental Status: He is alert. Mental status is at baseline.  Psychiatric:        Mood and Affect: Mood normal.        Thought Content: Thought content normal.        Judgment: Judgment normal.        Last 3 Office BP readings:    BP Readings from Last 3 Encounters:  09/16/23 138/67  09/15/23 (!) 196/112  09/03/23 (!) 190/75      Clinical Atherosclerotic Cardiovascular Disease (ASCVD): Yes  The 10-year ASCVD risk score (Arnett DK, et al., 2019) is: 17%   Values used to calculate the score:     Age: 62 years     Sex: Male     Is Non-Hispanic African American: Yes     Diabetic: No     Tobacco smoker: No     Systolic Blood Pressure: 138 mmHg     Is BP treated: Yes     HDL Cholesterol: 44 mg/dL     Total Cholesterol: 152 mg/dL   Basic Metabolic Panel Labs (Brief)          Component Value Date/Time    NA 138 09/15/2023 1120    K 5.0 09/15/2023 1120    CL 102 09/15/2023 1120    CO2 22 09/15/2023 1120    GLUCOSE 100 (H)  09/15/2023 1120    GLUCOSE 106 (H) 01/07/2023 1033    BUN 36 (H) 09/15/2023 1120    CREATININE 2.53 (H) 09/15/2023 1120    CREATININE 1.54 (H) 12/11/2016 1150    CALCIUM 9.9 09/15/2023 1120    GFRNONAA 54 (L) 01/07/2023 1033    GFRAA 66 07/06/2018 1152        Renal function: Estimated Creatinine Clearance: 36 mL/min (A) (by C-G formula based on SCr of 2.53 mg/dL (H)).     ABPM Study Data: Arm Placement  right wrist   Complicated REPORT with only 6 captured blood pressure readings - most likely related to bradycardia.  Overall Mean 24hr BP:                      Not able to interpret                HR: 39  Daytime Mean BP:                 Not  able to interpret                HR: 41  Nighttime Mean BP:              Not able to interpret                HR: 38    For Office Goal BP of <140 mmHg Systolic - lower if tolerated:  ABPM thresholds: Overall Systolic BP <130 mmHg, daytime BP <135 mmHg, sleeptime BP <120 mmHg      A/P: History of hypertension, cardiomyopathy and afib. Currently taking multiple medication regimen for cardiomyopathy/heart failure.  Goal blood pressure of <140 mmHg and lower if tolerated. Found to have bradycardia and elevated Scr on BMET. Results of amb BP assessment complicated by low capture rate, most likely due to bradycardia. Bradycardia is likely related to digoxin, amiodarone and recent change in renal function.  If office blood pressure reading today in office was stable at 138/67 Changes to medications -Discontinued digoxin -Decreased dose of amiodarone from 200mg  BID to 200mg  daily      BMET lab returned - Scr elevated  Discussed with Dr. Manson Passey Discontinue Spironolactone and furosemide  Follow-up on Friday planned.  12/20 Scheduled with PCP - Dr. Marsh Dolly - same day slot BMET repeat at that time.

## 2023-09-16 NOTE — Progress Notes (Signed)
S:     Chief Complaint  Patient presents with   Medication Management    Amb BP Measurement - Day #2   62 y.o. male who presents for hypertension evaluation, education, and management.  Patient arrives in good spirits and presents without any assistance.   PMH is significant for cardiomyopathy and afib.  Patient returns to clinic with 24 hour blood pressure monitor and reports no issues with monitor.  Patient reports they were able to wear the Ambulatory Blood Pressure Cuff for the entire 24 evaluation period.   Patient has NOT returned to Cardiology in > 6 months.    O:  Review of Systems  Constitutional:  Positive for malaise/fatigue (intermittent).  All other systems reviewed and are negative.   Physical Exam Constitutional:      Appearance: Normal appearance.  Cardiovascular:     Rate and Rhythm: Bradycardia present.  Pulmonary:     Effort: Pulmonary effort is normal.  Neurological:     Mental Status: He is alert. Mental status is at baseline.  Psychiatric:        Mood and Affect: Mood normal.        Thought Content: Thought content normal.        Judgment: Judgment normal.     Last 3 Office BP readings: BP Readings from Last 3 Encounters:  09/16/23 138/67  09/15/23 (!) 196/112  09/03/23 (!) 190/75    Clinical Atherosclerotic Cardiovascular Disease (ASCVD): Yes  The 10-year ASCVD risk score (Arnett DK, et al., 2019) is: 17%   Values used to calculate the score:     Age: 62 years     Sex: Male     Is Non-Hispanic African American: Yes     Diabetic: No     Tobacco smoker: No     Systolic Blood Pressure: 138 mmHg     Is BP treated: Yes     HDL Cholesterol: 44 mg/dL     Total Cholesterol: 152 mg/dL  Basic Metabolic Panel    Component Value Date/Time   NA 138 09/15/2023 1120   K 5.0 09/15/2023 1120   CL 102 09/15/2023 1120   CO2 22 09/15/2023 1120   GLUCOSE 100 (H) 09/15/2023 1120   GLUCOSE 106 (H) 01/07/2023 1033   BUN 36 (H) 09/15/2023 1120    CREATININE 2.53 (H) 09/15/2023 1120   CREATININE 1.54 (H) 12/11/2016 1150   CALCIUM 9.9 09/15/2023 1120   GFRNONAA 54 (L) 01/07/2023 1033   GFRAA 66 07/06/2018 1152    Renal function: Estimated Creatinine Clearance: 36 mL/min (A) (by C-G formula based on SCr of 2.53 mg/dL (H)).   ABPM Study Data: Arm Placement  right wrist  Complicated REPORT with only 6 captured blood pressure readings - most likely related to bradycardia.  Overall Mean 24hr BP:   Not able to interpret  HR: 39  Daytime Mean BP:  Not able to interpret  HR: 41  Nighttime Mean BP:  Not able to interpret  HR: 38   For Office Goal BP of <140 mmHg Systolic - lower if tolerated:  ABPM thresholds: Overall Systolic BP <130 mmHg, daytime BP <135 mmHg, sleeptime BP <120 mmHg    A/P: History of hypertension, cardiomyopathy and afib. Currently taking multiple medication regimen for cardiomyopathy/heart failure.  Goal blood pressure of <140 mmHg and lower if tolerated. Found to have bradycardia and elevated Scr on BMET. Results of amb BP assessment complicated by low capture rate, most likely due to bradycardia. Bradycardia is likely  related to digoxin, amiodarone and recent change in renal function.  If office blood pressure reading today in office was stable at 138/67 Changes to medications -Discontinued digoxin -Decreased dose of amiodarone from 200mg  BID to 200mg  daily  Results reviewed and written information provided.  Plan discussed with Dr. Manson Passey - attending of the day.    Written patient instructions provided. Patient verbalized understanding of treatment plan.  Total time in face to face counseling 48 minutes.    Follow-up:  Pharmacist PRN    BMET lab returned - Scr elevated  Discussed with Dr. Manson Passey Discontinue Spironolactone and furosemide  Follow-up on Friday planned.  12/20 Scheduled with PCP - Dr. Marsh Dolly - same day slot BMET repeat at that time.    Patient contacted - notified of lab result and  updated treatment plan.  Patient appreciated the follow-through.

## 2023-09-17 ENCOUNTER — Encounter: Payer: Self-pay | Admitting: Family Medicine

## 2023-09-17 NOTE — Progress Notes (Signed)
Reviewed and agree with Dr Koval's plan.   

## 2023-09-18 ENCOUNTER — Ambulatory Visit: Payer: BC Managed Care – PPO | Admitting: Family Medicine

## 2023-09-18 VITALS — BP 148/53 | HR 45 | Ht 72.0 in | Wt 219.4 lb

## 2023-09-18 DIAGNOSIS — I5022 Chronic systolic (congestive) heart failure: Secondary | ICD-10-CM | POA: Diagnosis not present

## 2023-09-18 DIAGNOSIS — I10 Essential (primary) hypertension: Secondary | ICD-10-CM | POA: Diagnosis not present

## 2023-09-18 DIAGNOSIS — N183 Chronic kidney disease, stage 3 unspecified: Secondary | ICD-10-CM | POA: Diagnosis not present

## 2023-09-18 DIAGNOSIS — I480 Paroxysmal atrial fibrillation: Secondary | ICD-10-CM

## 2023-09-18 DIAGNOSIS — I420 Dilated cardiomyopathy: Secondary | ICD-10-CM

## 2023-09-18 MED ORDER — ADULT BLOOD PRESSURE CUFF LG KIT: 1.0000 | PACK | Freq: Every day | 0 refills | Status: AC

## 2023-09-18 NOTE — Patient Instructions (Signed)
It was wonderful to see you today.  Please bring ALL of your medications with you to every visit.   Today we talked about:  Blood pressure and heart rate - With the changes your heart rate has improved. Your blood pressure looks much better today; however, it will be very important that you follow up with the cardiologist. They said they needed you to have an echocardiogram (ultrasound of your heart) before their visit so they can come up with the best plan. We have called the echo office to reschedule your appointment since you had a no show for the previous appointment. They will schedule a visit with you.   Please call [facility] for your cardiology appointment.   [phone]  Please follow up in 1 months   Thank you for choosing [facility].   Please call [phone] with any questions about today's appointment.  Please be sure to schedule follow up at the front desk before you leave today.   Lockie Mola, MD  Family Medicine

## 2023-09-18 NOTE — Progress Notes (Unsigned)
    SUBJECTIVE:   CHIEF COMPLAINT / HPI:   F/u Hypertension and Bradycardia Patient no longer has shortness of breath walking up the stairs as he did before the other appointment. He also says that he did not feel edematous after stopping the lasix. No chest pain, shortness of breath.  He has not called his cardiologist to make an appointment.    PERTINENT  PMH / PSH: PAF, CKD, CHF  OBJECTIVE:   BP (!) 178/58   Pulse (!) 49   Ht 6' (1.829 m)   Wt 219 lb 6.4 oz (99.5 kg)   SpO2 100%   BMI 29.76 kg/m   General: well appearing, in no acute distress CV: RRR, radial pulses equal and palpable, no BLE edema  Resp: Normal work of breathing on room air, CTAB Abd: Soft, non tender, non distended  Neuro: Alert & Oriented x 4    ASSESSMENT/PLAN:   Assessment & Plan Chronic systolic heart failure (HCC) Lasix and spironolactone were discontinued previous visit due to AKI on CKD 3. No adverse effect on volume status as of this visit.  - Continue to monitor weight and lower extremity edema  - Gave patient phone number to call cardiologist  - Rescheduled Echocardiogram that was ordered by cardiologist   Stage 3 chronic kidney disease, unspecified whether stage 3a or 3b CKD (HCC) Lasix and spironolactone were discontinued previous visit due to AKI on CKD 3  - BMP today to monitor Cr Essential hypertension BP still relatively well controlled despite cessation of lasix and spironolactone  - Follow up in 2-3 weeks  - Ordered patient home blood pressure monitor  PAF (paroxysmal atrial fibrillation) (HCC) Digoxin had been discontinued and amiodarone decreased last visit. Bradycardia has improved. Will likely continue to improve as new dose of amiodarone reaches steady state.  - Very important to go to cardiology appointment  - Follow up in 2-3 weeks      Lockie Mola, MD [facility]

## 2023-09-19 LAB — BASIC METABOLIC PANEL
BUN/Creatinine Ratio: 14 (ref 10–24)
BUN: 33 mg/dL — ABNORMAL HIGH (ref 8–27)
CO2: 20 mmol/L (ref 20–29)
Calcium: 9.5 mg/dL (ref 8.6–10.2)
Chloride: 105 mmol/L (ref 96–106)
Creatinine, Ser: 2.4 mg/dL — ABNORMAL HIGH (ref 0.76–1.27)
Glucose: 127 mg/dL — ABNORMAL HIGH (ref 70–99)
Potassium: 4.6 mmol/L (ref 3.5–5.2)
Sodium: 142 mmol/L (ref 134–144)
eGFR: 29 mL/min/{1.73_m2} — ABNORMAL LOW (ref >59)

## 2023-09-19 NOTE — Assessment & Plan Note (Signed)
Lasix and spironolactone were discontinued previous visit due to AKI on CKD 3  - BMP today to monitor Cr

## 2023-09-19 NOTE — Assessment & Plan Note (Signed)
BP still relatively well controlled despite cessation of lasix and spironolactone  - Follow up in 2-3 weeks  - Ordered patient home blood pressure monitor

## 2023-09-19 NOTE — Assessment & Plan Note (Signed)
Lasix and spironolactone were discontinued previous visit due to AKI on CKD 3. No adverse effect on volume status as of this visit.  - Continue to monitor weight and lower extremity edema  - Gave patient phone number to call cardiologist  - Rescheduled Echocardiogram that was ordered by cardiologist

## 2023-09-19 NOTE — Assessment & Plan Note (Signed)
Digoxin had been discontinued and amiodarone decreased last visit. Bradycardia has improved. Will likely continue to improve as new dose of amiodarone reaches steady state.  - Very important to go to cardiology appointment  - Follow up in 2-3 weeks

## 2023-09-21 ENCOUNTER — Telehealth: Payer: Self-pay

## 2023-09-21 ENCOUNTER — Telehealth: Payer: Self-pay | Admitting: Family Medicine

## 2023-09-21 DIAGNOSIS — N179 Acute kidney failure, unspecified: Secondary | ICD-10-CM

## 2023-09-21 NOTE — Telephone Encounter (Signed)
Spoke with patient's wife Bonita Quin) she will inform patient that PCP called to discuss lab results and he needs to schedule a lab visit in two weeks. Penni Bombard CMA

## 2023-09-21 NOTE — Telephone Encounter (Signed)
-----   Message from Lockie Mola sent at 09/21/2023 10:07 AM EST ----- Regarding: lab appointment Hi,   I attempted to call this patient regarding his lab results. They have improved but would like to check them again in 2 weeks. I have ordered the lab. DO you mind calling him to schedule a lab appointment?  Thanks,  Overton Mam

## 2023-09-21 NOTE — Telephone Encounter (Signed)
Called patient regarding his lab results from last visit. Patient did not answer. Left voicemail stating I had wanted to discuss lab results.   Patient's Cr improved from before most likely a good sign from stopping his lasix at least for now. Will likely continue to improve. I recommend patient get this lab rechecked in another two weeks. I have ordered the lab but will ask [facility] red pool to call and make a lab appointment for patient.

## 2023-10-16 ENCOUNTER — Ambulatory Visit (HOSPITAL_COMMUNITY)
Admission: RE | Admit: 2023-10-16 | Discharge: 2023-10-16 | Disposition: A | Discharge status: Home / Self Care | Payer: 59 | Source: Ambulatory Visit | Attending: Cardiovascular Disease

## 2023-10-16 DIAGNOSIS — I4891 Unspecified atrial fibrillation: Secondary | ICD-10-CM

## 2023-10-16 DIAGNOSIS — N189 Chronic kidney disease, unspecified: Secondary | ICD-10-CM | POA: Diagnosis not present

## 2023-10-16 DIAGNOSIS — I480 Paroxysmal atrial fibrillation: Secondary | ICD-10-CM | POA: Diagnosis present

## 2023-10-16 DIAGNOSIS — I5023 Acute on chronic systolic (congestive) heart failure: Secondary | ICD-10-CM | POA: Insufficient documentation

## 2023-10-16 DIAGNOSIS — Q2112 Patent foramen ovale: Secondary | ICD-10-CM | POA: Diagnosis not present

## 2023-10-16 DIAGNOSIS — I080 Rheumatic disorders of both mitral and aortic valves: Secondary | ICD-10-CM | POA: Diagnosis not present

## 2023-10-16 DIAGNOSIS — I1310 Hypertensive heart and chronic kidney disease without heart failure, with stage 1 through stage 4 chronic kidney disease, or unspecified chronic kidney disease: Secondary | ICD-10-CM | POA: Diagnosis not present

## 2023-10-16 DIAGNOSIS — I255 Ischemic cardiomyopathy: Secondary | ICD-10-CM | POA: Diagnosis not present

## 2023-10-16 LAB — ECHOCARDIOGRAM COMPLETE
AR max vel: 1.61 cm2
AV Area VTI: 1.57 cm2
AV Area mean vel: 1.47 cm2
AV Mean grad: 4.5 mm[Hg]
AV Peak grad: 8.4 mm[Hg]
Ao pk vel: 1.45 m/s
Area-P 1/2: 4.42 cm2
Calc EF: 72.4 %
S' Lateral: 3.2 cm
Single Plane A2C EF: 70.2 %
Single Plane A4C EF: 76 %

## 2023-10-23 ENCOUNTER — Ambulatory Visit: Payer: BC Managed Care – PPO | Admitting: Student

## 2023-10-26 ENCOUNTER — Other Ambulatory Visit: Payer: Self-pay | Admitting: Family Medicine

## 2023-10-26 DIAGNOSIS — I5023 Acute on chronic systolic (congestive) heart failure: Secondary | ICD-10-CM

## 2023-10-29 ENCOUNTER — Other Ambulatory Visit (HOSPITAL_COMMUNITY): Payer: Self-pay | Admitting: Cardiology

## 2023-10-29 NOTE — Telephone Encounter (Signed)
This is a CHF pt that has not been released yet. Please address

## 2023-11-02 ENCOUNTER — Telehealth: Payer: Self-pay

## 2023-11-02 NOTE — Telephone Encounter (Signed)
Ordering provider: Anne Fu Associated diagnoses: Chronic Systolic Heart Failure, PAF, Congestive dilated cardiomyopahty WatchPAT PA obtained on 11/02/2023 by Javier Genera, LPN. Authorization: Yes; tracking ID [id number] Patient notified of PIN ([phone]) on 11/02/2023 via Notification Method: phone.

## 2023-11-10 ENCOUNTER — Other Ambulatory Visit: Payer: Self-pay

## 2023-11-10 MED ORDER — SACUBITRIL-VALSARTAN 24-26 MG PO TABS: 1.0000 | ORAL_TABLET | Freq: Two times a day (BID) | ORAL | 3 refills | Status: DC

## 2023-11-22 ENCOUNTER — Other Ambulatory Visit: Payer: Self-pay | Admitting: Family Medicine

## 2023-11-22 DIAGNOSIS — I5023 Acute on chronic systolic (congestive) heart failure: Secondary | ICD-10-CM

## 2023-11-23 ENCOUNTER — Other Ambulatory Visit: Payer: Self-pay | Admitting: Family Medicine

## 2023-11-23 DIAGNOSIS — I5023 Acute on chronic systolic (congestive) heart failure: Secondary | ICD-10-CM

## 2023-12-07 ENCOUNTER — Other Ambulatory Visit: Payer: Self-pay | Admitting: Family Medicine

## 2023-12-07 DIAGNOSIS — I10 Essential (primary) hypertension: Secondary | ICD-10-CM

## 2023-12-18 ENCOUNTER — Telehealth: Payer: Self-pay | Admitting: Family Medicine

## 2023-12-18 NOTE — Telephone Encounter (Signed)
 Patient dropped off form at front desk for [employer].  Verified that patient section of form has been completed.  Last DOS/WCC with PCP was [id number].  Placed form in Red team folder to be completed by clinical staff.  Vilinda Blanks

## 2023-12-21 NOTE — Telephone Encounter (Signed)
 Patient called and informed that forms are ready for pick up. Copy made and placed in batch scanning. Original placed at front desk for pick up.   Veronda Prude, RN

## 2024-03-01 ENCOUNTER — Other Ambulatory Visit (HOSPITAL_COMMUNITY): Payer: Self-pay

## 2024-03-01 ENCOUNTER — Telehealth: Payer: Self-pay

## 2024-03-01 NOTE — Telephone Encounter (Signed)
 Pharmacy Patient Advocate Encounter  Received notification from [facility] that Prior Authorization for ENTRESTO  has been APPROVED from 03/01/24 to 03/01/25. Ran test claim, Copay is $47. This test claim was processed through [facility]- copay amounts may vary at other pharmacies due to pharmacy/plan contracts, or as the patient moves through the different stages of their insurance plan.

## 2024-03-01 NOTE — Telephone Encounter (Signed)
 Pharmacy Patient Advocate Encounter   Received notification from Fax that prior authorization for ENTRESTO  is required/requested.   Insurance verification completed.   The patient is insured through [facility] .   Per test claim: PA required; PA submitted to above mentioned insurance via CoverMyMeds Key/confirmation #/EOC [id number] Status is pending

## 2024-03-24 ENCOUNTER — Other Ambulatory Visit: Payer: Self-pay | Admitting: Family Medicine

## 2024-03-24 DIAGNOSIS — I5023 Acute on chronic systolic (congestive) heart failure: Secondary | ICD-10-CM

## 2024-03-29 ENCOUNTER — Other Ambulatory Visit: Payer: Self-pay | Admitting: Cardiovascular Disease

## 2024-03-29 NOTE — Telephone Encounter (Signed)
 Prescription refill request for Eliquis  received. Indication:afib Last office visit:needs appt Scr:na Age: 63 Weight:99.5  kg  Prescription refilled

## 2024-04-07 ENCOUNTER — Ambulatory Visit

## 2024-04-07 ENCOUNTER — Ambulatory Visit: Payer: Self-pay

## 2024-04-07 VITALS — BP 139/91 | HR 63 | Ht 72.0 in | Wt 236.4 lb

## 2024-04-07 DIAGNOSIS — R7303 Prediabetes: Secondary | ICD-10-CM

## 2024-04-07 DIAGNOSIS — N529 Male erectile dysfunction, unspecified: Secondary | ICD-10-CM | POA: Diagnosis not present

## 2024-04-07 DIAGNOSIS — I10 Essential (primary) hypertension: Secondary | ICD-10-CM

## 2024-04-07 DIAGNOSIS — M25562 Pain in left knee: Secondary | ICD-10-CM

## 2024-04-07 DIAGNOSIS — M25561 Pain in right knee: Secondary | ICD-10-CM

## 2024-04-07 LAB — POCT GLYCOSYLATED HEMOGLOBIN (HGB A1C): HbA1c, POC (prediabetic range): 6 % (ref 5.7–6.4)

## 2024-04-07 MED ORDER — SILDENAFIL CITRATE 25 MG PO TABS: 25.0000 mg | ORAL_TABLET | Freq: Every day | ORAL | 0 refills | Status: AC | PRN

## 2024-04-07 MED ORDER — HYDROCODONE-ACETAMINOPHEN 10-325 MG PO TABS: 1.0000 | ORAL_TABLET | Freq: Three times a day (TID) | ORAL | 0 refills | Status: DC | PRN

## 2024-04-07 NOTE — Progress Notes (Signed)
 SUBJECTIVE:   CHIEF COMPLAINT / HPI: shoulders with kneed pain and Erectile Dysfunction  Javier Berry is a 63 YO male presenting with his wife today for worsening pain on both shoulders and knees. He has PMH of Afib ( on Eliquis  ), Gout, CHF, Stage 3 CKD , and HTN. Per patient he has been experience worsening bilateral shoulders and knees pain off and on for over the decade. Per wife, before Javier Berry transferred to [facility] after his old PCP was retired he was prescribed with Hydrocodone  for pain control and have significant improved his joint pain. In addition, patient and his wife also expressing their frustration about his erectile dysfunction which they have been using non-medical methods without success. Otherwise he has been doing well. He denies chest tightness, palpitation, chest pain, SOB, or abdominal pain.    Shoulders and knee pain Patient states his knees and shoulder pain have been worsening in the last few days. He has been using Oxycodone  as needed as prescribed since 2023 but now he is completely out of the medication and do not have any medications to help with pain control. Per his wife, he also has a gout but currently on remission. Pt states he feels that the oxycodone  is not effective to control his pain compare to Hydrocodone  which he was prescribed by his previous PCP before. He denies pain on anywhere else beside should and knee. His pain worsen w/ activities.   Erectile Dysfunction Patient states he has been experiencing Erectile Dysfunction in the past and used to take Sildenafil  which was very helpful. However he has not been using the medication in the last few year because his erectile dysfunction has improved until over the last year that his symptoms become worsening. Pt c/o unable to get erected and stay erected. Patient and his wife also state that they have been using non-medical sexual aid such as penile ring without success d/t pain.   PERTINENT  PMH / PSH:   Joints pain Erectile Dysfunction Afib CHF  Stage 3 CKD HTN  OBJECTIVE:   BP (!) 139/91   Pulse 63   Ht 6' (1.829 m)   Wt 236 lb 6.4 oz (107.2 kg)   SpO2 99%   BMI 32.06 kg/m   Physical Exam Constitutional:      Appearance: Normal appearance.  Cardiovascular:     Rate and Rhythm: Normal rate and regular rhythm.     Pulses: Normal pulses.     Heart sounds: Normal heart sounds.  Pulmonary:     Effort: Pulmonary effort is normal.     Breath sounds: Normal breath sounds.  Abdominal:     General: Abdomen is flat. Bowel sounds are normal.     Palpations: Abdomen is soft.  Musculoskeletal:     Comments: Diminished shoulder and knee extension R>L knee pain w/ palpation  Neurological:     General: No focal deficit present.     Mental Status: He is alert and oriented to person, place, and time. Mental status is at baseline.  Psychiatric:        Mood and Affect: Mood normal.        Behavior: Behavior normal.        Thought Content: Thought content normal.        Judgment: Judgment normal.     ASSESSMENT/PLAN:   Javier Berry is a 63 YO male present at [facility] today with his wife for concerning about worsening joints pain and erectile dysfunction. He has been  doing fairly well on his current medications. Overall, planing on returning to the clinic in 2 weeks to follow up with new medications ( Sidenafil , NORCO) that prescribed today Assessment & Plan Erectile dysfunction, unspecified erectile dysfunction type - Starting Sidenafil 25 MG daily as needed today. Recommending the patient to take the medication 1 hour before sexual activity.  Prediabetes - Pending A1C, CBC, and CMP result  Arthralgia of both knees - Starting back on NORCO 10-325 mg tablet PRN - Referral to sport medicine due to bilateral shoulders and knees pain   Plan discuss with Dr.Mcintyre, attending physician who agreed with plan.    Javier Gravely, DO PGY 1 Family Medicine Resident  [facility]

## 2024-04-07 NOTE — Patient Instructions (Signed)
   It was great to see you!  Our plans for today:  - Starting Viagra  at 25 mg daily PRN Please take medication 1 hr before sexual activity - Starting back on Hydrocodone  as needed - I am also referring you to see Sport Medicine at [facility]. They should contact you to schedule an appointment - Please make a follow up visit with us  in 2 week  [facility] [address] [place],  [place]  [address] Main: [phone]  Fax: [phone]  Sunday:Closed Monday:8:30 AM - 4:30 PM Tuesday:8:30 AM - 4:30 PM Wednesday:8:30 AM - 4:30 PM Thursday:8:30 AM - 4:30 PM Friday:8:30 AM - 12:00 PM Saturday:Closed Closed New Year's Day, Memorial Day, Independence Day, Labor Day, Thanksgiving Day and Christmas Day.   We are checking some labs today, we will release these results to your MyChart.  Take care and seek immediate care sooner if you develop any concerns.     Houston Coralee HAS PGY 1 Family Medicine Resident [facility]  [address] [place], [place] [address] Fax [phone] Phone [phone] 04/07/2024, 2:02 PM

## 2024-04-08 ENCOUNTER — Other Ambulatory Visit: Payer: Self-pay

## 2024-04-08 DIAGNOSIS — E875 Hyperkalemia: Secondary | ICD-10-CM

## 2024-04-08 LAB — COMPREHENSIVE METABOLIC PANEL WITH GFR
ALT: 24 IU/L (ref 0–44)
AST: 35 IU/L (ref 0–40)
Albumin: 4.9 g/dL (ref 3.9–4.9)
Alkaline Phosphatase: 72 IU/L (ref 44–121)
BUN/Creatinine Ratio: 11 (ref 10–24)
BUN: 24 mg/dL (ref 8–27)
Bilirubin Total: 0.8 mg/dL (ref 0.0–1.2)
CO2: 23 mmol/L (ref 20–29)
Calcium: 10.5 mg/dL — ABNORMAL HIGH (ref 8.6–10.2)
Chloride: 101 mmol/L (ref 96–106)
Creatinine, Ser: 2.19 mg/dL — ABNORMAL HIGH (ref 0.76–1.27)
Globulin, Total: 2.8 g/dL (ref 1.5–4.5)
Glucose: 91 mg/dL (ref 70–99)
Potassium: 5.3 mmol/L — ABNORMAL HIGH (ref 3.5–5.2)
Sodium: 144 mmol/L (ref 134–144)
Total Protein: 7.7 g/dL (ref 6.0–8.5)
eGFR: 33 mL/min/1.73 — ABNORMAL LOW (ref >59)

## 2024-04-08 LAB — CBC
Hematocrit: 48.9 % (ref 37.5–51.0)
Hemoglobin: 16.6 g/dL (ref 13.0–17.7)
MCH: 33.8 pg — ABNORMAL HIGH (ref 26.6–33.0)
MCHC: 33.9 g/dL (ref 31.5–35.7)
MCV: 100 fL — ABNORMAL HIGH (ref 79–97)
Platelets: 187 x10E3/uL (ref 150–450)
RBC: 4.91 x10E6/uL (ref 4.14–5.80)
RDW: 13.8 % (ref 11.6–15.4)
WBC: 5.7 x10E3/uL (ref 3.4–10.8)

## 2024-04-08 NOTE — Telephone Encounter (Cosign Needed Addendum)
  Called Mr. Murin today regarding his hyperkalemia. His Potassium was 5.3 on CMP yesterday. Recommending the patient to stop taking Spironolactone  and come back for his lab draw on Monday 04/11/24 at 2pm to recheck his BMP. Pa. Verbalized an understanding and agreed with plan.  Plan discuss with Dr.McIntyre, attending physician, who agreed with plan.      Houston Bosch, DO PGY 1 Family Medicine Resident

## 2024-04-08 NOTE — Addendum Note (Signed)
 Addended by: CORALEE ELDER on: 04/08/2024 12:33 PM   Modules accepted: Orders

## 2024-04-11 ENCOUNTER — Other Ambulatory Visit: Payer: Self-pay

## 2024-04-11 DIAGNOSIS — E875 Hyperkalemia: Secondary | ICD-10-CM

## 2024-04-12 LAB — BASIC METABOLIC PANEL WITH GFR
BUN/Creatinine Ratio: 11 (ref 10–24)
BUN: 24 mg/dL (ref 8–27)
CO2: 23 mmol/L (ref 20–29)
Calcium: 9.5 mg/dL (ref 8.6–10.2)
Chloride: 105 mmol/L (ref 96–106)
Creatinine, Ser: 2.18 mg/dL — ABNORMAL HIGH (ref 0.76–1.27)
Glucose: 100 mg/dL — ABNORMAL HIGH (ref 70–99)
Potassium: 4.5 mmol/L (ref 3.5–5.2)
Sodium: 142 mmol/L (ref 134–144)
eGFR: 33 mL/min/1.73 — ABNORMAL LOW (ref >59)

## 2024-04-13 ENCOUNTER — Ambulatory Visit: Payer: Self-pay

## 2024-05-02 ENCOUNTER — Other Ambulatory Visit: Payer: Self-pay | Admitting: Family Medicine

## 2024-05-02 ENCOUNTER — Other Ambulatory Visit: Payer: Self-pay

## 2024-05-02 DIAGNOSIS — M25561 Pain in right knee: Secondary | ICD-10-CM

## 2024-05-02 MED ORDER — HYDROCODONE-ACETAMINOPHEN 10-325 MG PO TABS: 1.0000 | ORAL_TABLET | Freq: Three times a day (TID) | ORAL | 0 refills | Status: DC | PRN

## 2024-05-02 NOTE — Addendum Note (Signed)
 Addended by: GLENDIA DAMIEN SQUIBB on: 05/02/2024 02:52 PM   Modules accepted: Orders

## 2024-05-02 NOTE — Telephone Encounter (Signed)
 Patients wife calls nurse line requesting a refill on Hydrocodone .   She reports a small supply was given on 7/10 for #5 tablets. She reports he is requesting the same prescription.   Patient has a FU scheduled with PCP for 8/8.  Advised will forward to PCP for possible refill prior to scheduled apt.   Otherwise, will schedule with another provider sooner in the week.

## 2024-05-02 NOTE — Telephone Encounter (Signed)
 It appears medication was set to print.   Please resend to [facility] on [address].  Patients wife has been updated.

## 2024-05-06 ENCOUNTER — Encounter: Payer: Self-pay | Admitting: Family Medicine

## 2024-05-06 ENCOUNTER — Ambulatory Visit: Admitting: Family Medicine

## 2024-05-06 VITALS — BP 137/83 | HR 65 | Ht 72.0 in | Wt 240.6 lb

## 2024-05-06 DIAGNOSIS — Z1211 Encounter for screening for malignant neoplasm of colon: Secondary | ICD-10-CM

## 2024-05-06 DIAGNOSIS — Z23 Encounter for immunization: Secondary | ICD-10-CM | POA: Diagnosis not present

## 2024-05-06 DIAGNOSIS — M150 Primary generalized (osteo)arthritis: Secondary | ICD-10-CM | POA: Diagnosis not present

## 2024-05-06 MED ORDER — HYDROCODONE-ACETAMINOPHEN 10-325 MG PO TABS: 1.0000 | ORAL_TABLET | Freq: Three times a day (TID) | ORAL | 0 refills | Status: DC | PRN

## 2024-05-06 MED ORDER — DICLOFENAC SODIUM 1 % EX GEL: 4.0000 g | Freq: Four times a day (QID) | CUTANEOUS | 2 refills | Status: AC

## 2024-05-06 NOTE — Progress Notes (Signed)
    SUBJECTIVE:   CHIEF COMPLAINT / HPI:   Chronic Pain I Osteoarthritis  Patient states that he has had significant pain in both of his knees and his right shoulder for years now. He has previously been diagnosed with OA in his knees and this pain is similar to that. He states that he has been getting 30 day supplies of opioids for the last few years to deal with the pain. He has not been using NSAIDs as he has CKD. However, PDMP only lists one prescription in 2024 and two recent prescriptions for 10 days of norco. Patient has not had knee injections before. He has had injections in his feet that have helped previously.  He has tried voltaren  gel intermittently in the past.  Patient used to be a Education administrator. He had to kneel and stand up a lot and is right handed.  Patient says exercise has been helping his symptoms. He says he can't lift as much as he used to but keeping moving helps his symptoms.  Has not gone to physical therapy before.   Shoulder pain is chronic as well. Says that it hurts especially when he sleeps on it at night. Also hurts to raise his arm or go across to the other side.   PERTINENT  PMH / PSH: OA, Gout, CKD  OBJECTIVE:   BP 137/83   Pulse 65   Ht 6' (1.829 m)   Wt 240 lb 9.6 oz (109.1 kg)   SpO2 100%   BMI 32.63 kg/m   General: well appearing, in no acute distress CV: well perfused appearing  Resp: Normal work of breathing on room air Neuro: Alert & Oriented x 4, able to sit comfortably in chair, normal gait  MSK:  Knees:  Symmetric appearance, no edema  No pain along joint lines while palpating bilateral crepitus with extension, position grind test  Negative anterior drawer, negative valgus and varus stress   Right shoulder  No asymmetry on inspection  No tenderness over long head of biceps (bicipital groove). No TTP at [facility] joint. Full passive range of motion, however, active range of motion limited secondary to pain. Able to actively flex to about 100,  120Abd Positive neers test with decreased strength       ASSESSMENT/PLAN:   Assessment & Plan Primary osteoarthritis involving multiple joints Significant discomfort from multiple joints. Has been trying to use opioids for pain control though  not ideal. Not actually on chronic opioids. Cannot take oral NSAIDs due to CKD.  - Continued to encourage movement and exercise, recommended swimming  - Voltaren  gel BID  - Right Shoulder xray  - Shared decision making to provide 10 pills of norco 10-325 to last until appointment in one month for steroid injections.  Explained to patient that opioids are not the recommended treatment for OA and the risks of continued opioid treatment.  - Follow up in one month for steroid injections  Colon cancer screening Patient elected cologuard testing instead of colonoscopy. Counseled patient on the risks and benefits and need for colonoscopy if concerning result.      Areta Saliva, MD [facility]

## 2024-05-06 NOTE — Patient Instructions (Addendum)
 It was wonderful to see you today.  Please bring ALL of your medications with you to every visit.   Today we talked about:  Arthritis - I am going to only fill 10 pills of norco. You have not had chronic opioids and we want to avoid them to avoid addiction and avoid you becoming tolerant and needing higher doses in the future. Opioids are not the best medications for dealing with arthritis. We should plan for injections in a month. This is to tide you over until then. Continue exercising - try swimming to help off load the joints. Additionally use the voltaren  gel twice a day.  I have also ordered an xray of your shoulder you can walk in to [facility] get it done you do not need an appointment.   I recommend you using a knee brace. You can find one that has a hinge on the side this will help support the knees as well.   Please follow up in 1 months   Thank you for choosing [facility].   Please call [phone] with any questions about today's appointment.  Please be sure to schedule follow up at the front desk before you leave today.   Areta Saliva, MD  Family Medicine

## 2024-05-06 NOTE — Assessment & Plan Note (Signed)
 Significant discomfort from multiple joints. Has been trying to use opioids for pain control though  not ideal. Not actually on chronic opioids. Cannot take oral NSAIDs due to CKD.  - Continued to encourage movement and exercise, recommended swimming  - Voltaren  gel BID  - Right Shoulder xray  - Shared decision making to provide 10 pills of norco 10-325 to last until appointment in one month for steroid injections.  Explained to patient that opioids are not the recommended treatment for OA and the risks of continued opioid treatment.  - Follow up in one month for steroid injections

## 2024-05-09 ENCOUNTER — Telehealth: Payer: Self-pay

## 2024-05-09 ENCOUNTER — Other Ambulatory Visit (HOSPITAL_COMMUNITY): Payer: Self-pay

## 2024-05-09 DIAGNOSIS — M150 Primary generalized (osteo)arthritis: Secondary | ICD-10-CM

## 2024-05-09 NOTE — Telephone Encounter (Signed)
 Rec'd PA request for patients hydrocodone -acetaminophen  however PA isnt needed. Test claim shows: prescribers associated DEA is not found.   An attending may need to resend RX for insurance coverage purposes.

## 2024-05-10 MED ORDER — HYDROCODONE-ACETAMINOPHEN 10-325 MG PO TABS: 1.0000 | ORAL_TABLET | Freq: Three times a day (TID) | ORAL | 0 refills | Status: AC | PRN

## 2024-05-10 NOTE — Telephone Encounter (Signed)
 Prescription for HC-APAP sent to pharmacy

## 2024-05-26 ENCOUNTER — Other Ambulatory Visit: Payer: Self-pay

## 2024-05-26 DIAGNOSIS — M150 Primary generalized (osteo)arthritis: Secondary | ICD-10-CM

## 2024-05-26 NOTE — Telephone Encounter (Signed)
 Patient's wife calls nurse line regarding medication refill.   Wife reports that patient has an upcoming appt with [facility], however, this is not until 06/16/24.  Wife is asking if Dr. Nicholas would be able to send in another prescription to hold patient until he is able to establish with specialist. PCP next available is not until 06/17/24  Please advise.   Chiquita JAYSON English, RN

## 2024-06-02 ENCOUNTER — Ambulatory Visit: Admitting: Student

## 2024-06-07 ENCOUNTER — Ambulatory Visit

## 2024-06-30 ENCOUNTER — Other Ambulatory Visit: Payer: Self-pay | Admitting: *Deleted

## 2024-06-30 ENCOUNTER — Other Ambulatory Visit: Payer: Self-pay | Admitting: Cardiovascular Disease

## 2024-06-30 DIAGNOSIS — I480 Paroxysmal atrial fibrillation: Secondary | ICD-10-CM

## 2024-06-30 MED ORDER — FUROSEMIDE 40 MG PO TABS: 40.0000 mg | ORAL_TABLET | Freq: Every day | ORAL | 3 refills | Status: AC

## 2024-09-14 ENCOUNTER — Other Ambulatory Visit: Payer: Self-pay | Admitting: Cardiovascular Disease

## 2024-09-14 DIAGNOSIS — I480 Paroxysmal atrial fibrillation: Secondary | ICD-10-CM

## 2024-09-15 ENCOUNTER — Other Ambulatory Visit: Payer: Self-pay | Admitting: *Deleted

## 2024-09-15 DIAGNOSIS — I5023 Acute on chronic systolic (congestive) heart failure: Secondary | ICD-10-CM

## 2024-09-15 MED ORDER — DAPAGLIFLOZIN PROPANEDIOL 10 MG PO TABS: 10.0000 mg | ORAL_TABLET | Freq: Every day | ORAL | 1 refills | Status: AC

## 2024-09-15 NOTE — Telephone Encounter (Signed)
 Refill request

## 2024-09-15 NOTE — Telephone Encounter (Signed)
 Prescription refill request for Eliquis  received. Indication: afib  Last office visit: [facility], 02/11/2023 Scr: 2.18, 04/11/2024 Age: 63 yo  Weight: 109.1 kg    Pt is overdue to see cardiologist. Msg sent to schedulers.

## 2024-09-19 ENCOUNTER — Other Ambulatory Visit: Payer: Self-pay | Admitting: Family Medicine

## 2024-09-19 DIAGNOSIS — I480 Paroxysmal atrial fibrillation: Secondary | ICD-10-CM

## 2024-09-19 DIAGNOSIS — I5023 Acute on chronic systolic (congestive) heart failure: Secondary | ICD-10-CM

## 2024-09-19 MED ORDER — ALLOPURINOL 100 MG PO TABS: 100.0000 mg | ORAL_TABLET | Freq: Every day | ORAL | 3 refills | Status: AC

## 2024-09-19 MED ORDER — ELIQUIS 5 MG PO TABS: 5.0000 mg | ORAL_TABLET | Freq: Two times a day (BID) | ORAL | 0 refills | Status: DC

## 2024-09-19 NOTE — Telephone Encounter (Signed)
 Patient request refill of:  Name of Medication(s):  Furosemide , Allopurinol , Farxiga .Eliquis ,Entresto  Last date of OV:  05/06/2024 Pharmacy:  [facility] [address] [place], [place] [address]  Will route refill request to Team CMA.  Discussed with patient policy to call pharmacy for future refills.  Also, discussed refills may take up to 48 hours to approve or deny.  Chiquita POUR Benfield

## 2024-09-19 NOTE — Telephone Encounter (Addendum)
 Called patient since he is overdue to to see the Cardiologist. Had to leave a message for him to call back regarding this.   Last OV 02/11/23 Dr. Nancey  Dx-Afib Age-39yrs Tu-[id number] Crea-2.18 on 04/11/24

## 2024-09-20 ENCOUNTER — Other Ambulatory Visit: Payer: Self-pay | Admitting: Cardiovascular Disease

## 2024-09-20 DIAGNOSIS — I480 Paroxysmal atrial fibrillation: Secondary | ICD-10-CM

## 2024-09-20 NOTE — Telephone Encounter (Signed)
 Prescription refill request for Eliquis  received. Indication:afib Last office visit:needs appt Scr: Age:  Weight:  Prescription refilled

## 2024-09-20 NOTE — Telephone Encounter (Signed)
 This eliquis  refill was processed by another user Dr. Nicholas. Since the refill was refilled for 30 day will deny at this time. Pt is overdue for Cardiologist and unable to contact patient.

## 2024-10-01 ENCOUNTER — Other Ambulatory Visit: Payer: Self-pay | Admitting: Cardiovascular Disease

## 2024-10-10 ENCOUNTER — Other Ambulatory Visit (HOSPITAL_COMMUNITY): Payer: Self-pay

## 2024-10-26 ENCOUNTER — Telehealth: Payer: Self-pay | Admitting: Cardiovascular Disease

## 2024-10-26 NOTE — Telephone Encounter (Signed)
-----   Message from [place] G sent at 09/28/2024  3:27 PM EST -----  ----- Message ----- From: Royden Isaiah NOVAK, RN Sent: 09/15/2024   1:43 PM EST To: Lurena Hershal [place] Scheduling  Received medication refill request. Pt is overdue for an office visit with provider. Could you please schedule  an appointment?  Thank you! - Anticoagulation Team

## 2024-10-26 NOTE — Telephone Encounter (Signed)
 Call x1; lvmtcb to schedule patient w/ [facility]/EP APP for overdue f/u, was due following his last echo.
